# Patient Record
Sex: Male | Born: 1945 | Race: White | Hispanic: No | Marital: Married | Smoking: Never smoker
Health system: Southern US, Community
[De-identification: ages and names within clinical notes are randomized; demographics above are authoritative.]

## PROBLEM LIST (undated history)

## (undated) DIAGNOSIS — M722 Plantar fascial fibromatosis: Secondary | ICD-10-CM

## (undated) DIAGNOSIS — M1990 Unspecified osteoarthritis, unspecified site: Secondary | ICD-10-CM

## (undated) DIAGNOSIS — I10 Essential (primary) hypertension: Secondary | ICD-10-CM

## (undated) HISTORY — PX: VAGAL NERVE STIMULATOR REMOVAL: SUR1110

## (undated) HISTORY — PX: TRIGGER FINGER RELEASE: SHX641

## (undated) HISTORY — DX: Essential (primary) hypertension: I10

## (undated) HISTORY — DX: Unspecified osteoarthritis, unspecified site: M19.90

## (undated) HISTORY — PX: IMPLANTATION VAGAL NERVE STIMULATOR: SUR692

## (undated) HISTORY — DX: Plantar fascial fibromatosis: M72.2

---

## 2000-02-21 HISTORY — PX: TUMOR REMOVAL: SHX12

## 2001-02-20 HISTORY — PX: CARDIAC SURGERY: SHX584

## 2006-08-06 ENCOUNTER — Ambulatory Visit: Payer: Self-pay | Admitting: Oncology

## 2006-08-22 LAB — CBC WITH DIFFERENTIAL/PLATELET
Basophils Absolute: 0 10*3/uL (ref 0.0–0.1)
Eosinophils Absolute: 0.1 10*3/uL (ref 0.0–0.5)
HCT: 37.5 % — ABNORMAL LOW (ref 38.7–49.9)
HGB: 13.5 g/dL (ref 13.0–17.1)
LYMPH%: 19.6 % (ref 14.0–48.0)
MCV: 89.8 fL (ref 81.6–98.0)
MONO#: 0.5 10*3/uL (ref 0.1–0.9)
MONO%: 7.3 % (ref 0.0–13.0)
NEUT#: 4.5 10*3/uL (ref 1.5–6.5)
NEUT%: 71.4 % (ref 40.0–75.0)
Platelets: 339 10*3/uL (ref 145–400)
RBC: 4.18 10*6/uL — ABNORMAL LOW (ref 4.20–5.71)
WBC: 6.3 10*3/uL (ref 4.0–10.0)

## 2006-08-27 LAB — SPEP & IFE WITH QIG
Alpha-1-Globulin: 3.7 % (ref 2.9–4.9)
Alpha-2-Globulin: 10.6 % (ref 7.1–11.8)
Beta Globulin: 6.5 % (ref 4.7–7.2)
IgG (Immunoglobin G), Serum: 1080 mg/dL (ref 694–1618)
Total Protein, Serum Electrophoresis: 7.8 g/dL (ref 6.0–8.3)

## 2006-08-27 LAB — PSA: PSA: 2.67 ng/mL (ref 0.10–4.00)

## 2006-08-27 LAB — COMPREHENSIVE METABOLIC PANEL
Alkaline Phosphatase: 43 U/L (ref 39–117)
BUN: 25 mg/dL — ABNORMAL HIGH (ref 6–23)
CO2: 24 mEq/L (ref 19–32)
Glucose, Bld: 194 mg/dL — ABNORMAL HIGH (ref 70–99)
Sodium: 137 mEq/L (ref 135–145)
Total Bilirubin: 0.6 mg/dL (ref 0.3–1.2)
Total Protein: 7.8 g/dL (ref 6.0–8.3)

## 2006-08-27 LAB — KAPPA/LAMBDA LIGHT CHAINS
Kappa free light chain: 0.93 mg/dL (ref 0.33–1.94)
Kappa:Lambda Ratio: 0.69 (ref 0.26–1.65)

## 2006-08-27 LAB — CEA: CEA: 0.8 ng/mL (ref 0.0–5.0)

## 2006-08-28 ENCOUNTER — Ambulatory Visit (HOSPITAL_COMMUNITY): Admission: RE | Admit: 2006-08-28 | Discharge: 2006-08-28 | Payer: Self-pay | Admitting: Oncology

## 2006-09-20 ENCOUNTER — Ambulatory Visit (HOSPITAL_COMMUNITY): Admission: RE | Admit: 2006-09-20 | Discharge: 2006-09-20 | Payer: Self-pay | Admitting: Oncology

## 2006-09-20 ENCOUNTER — Encounter (INDEPENDENT_AMBULATORY_CARE_PROVIDER_SITE_OTHER): Payer: Self-pay | Admitting: Interventional Radiology

## 2006-09-24 ENCOUNTER — Ambulatory Visit: Payer: Self-pay | Admitting: Oncology

## 2006-10-24 ENCOUNTER — Encounter (INDEPENDENT_AMBULATORY_CARE_PROVIDER_SITE_OTHER): Payer: Self-pay | Admitting: Neurosurgery

## 2006-10-24 ENCOUNTER — Inpatient Hospital Stay (HOSPITAL_COMMUNITY): Admission: RE | Admit: 2006-10-24 | Discharge: 2006-10-25 | Payer: Self-pay | Admitting: Neurosurgery

## 2007-05-16 ENCOUNTER — Observation Stay (HOSPITAL_COMMUNITY): Admission: RE | Admit: 2007-05-16 | Discharge: 2007-05-17 | Payer: Self-pay | Admitting: Neurosurgery

## 2007-05-29 ENCOUNTER — Encounter: Admission: RE | Admit: 2007-05-29 | Discharge: 2007-05-29 | Payer: Self-pay | Admitting: Neurosurgery

## 2007-07-25 ENCOUNTER — Ambulatory Visit (HOSPITAL_COMMUNITY): Admission: RE | Admit: 2007-07-25 | Discharge: 2007-07-26 | Payer: Self-pay | Admitting: Neurosurgery

## 2008-01-14 ENCOUNTER — Ambulatory Visit (HOSPITAL_COMMUNITY): Admission: RE | Admit: 2008-01-14 | Discharge: 2008-01-15 | Payer: Self-pay | Admitting: Neurosurgery

## 2009-06-09 IMAGING — CR DG THORACOLUMBAR SPINE 2V
2 series · 2 of 2 positions shown · non-contrast
Comparison: Operative films from 05/16/2007

CLINICAL DATA: Right foot pain.  Status post spinal cord stimulator
placement.

THORACOLUMBAR SPINE - 2 VIEW

[t t-spine/l-spine junc. ap]
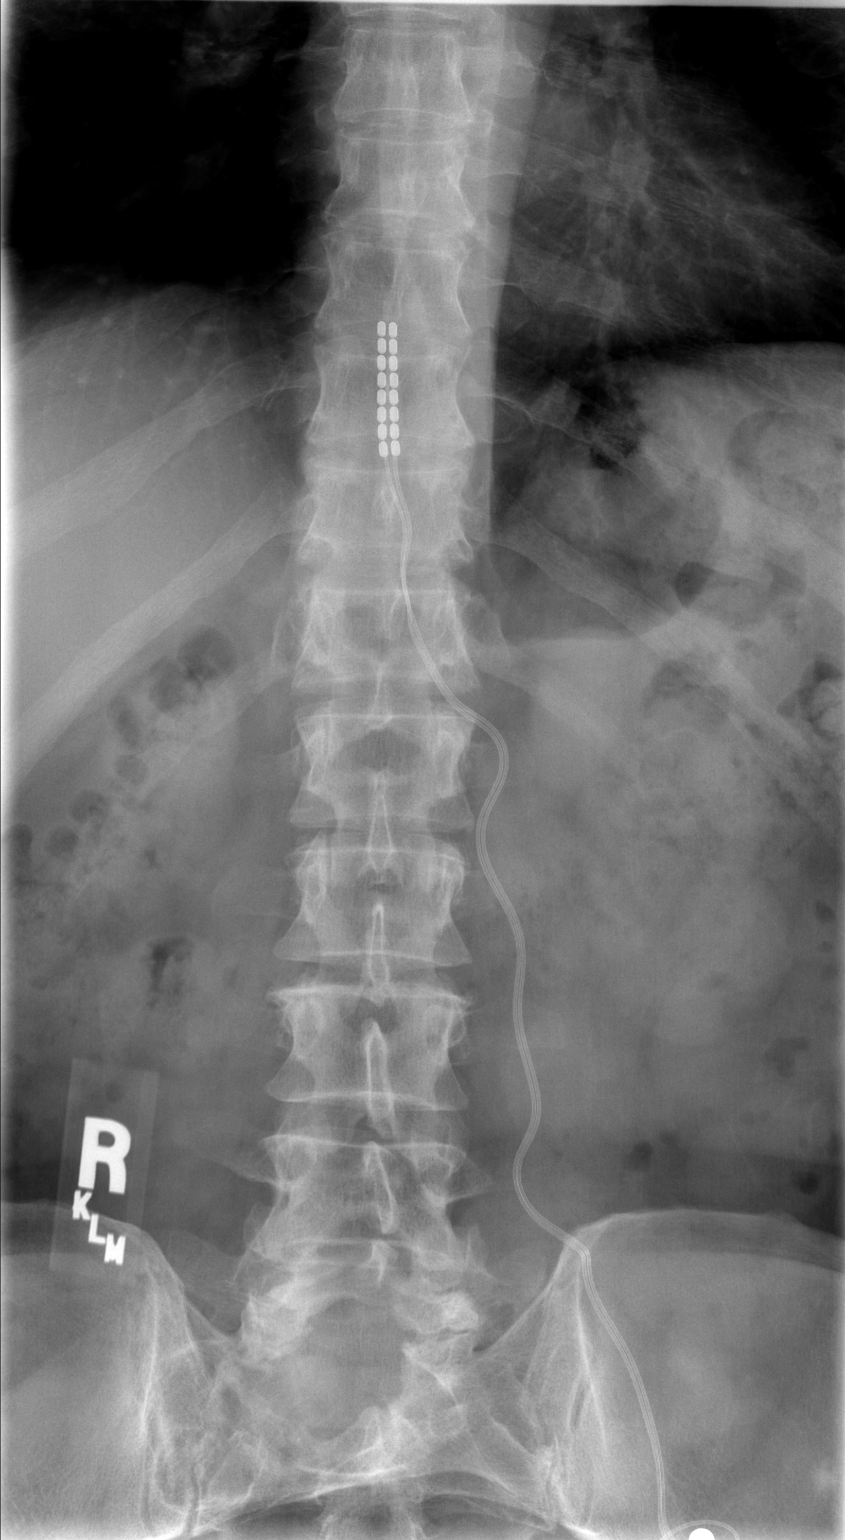

[t t-spine/l-spine junc lat]
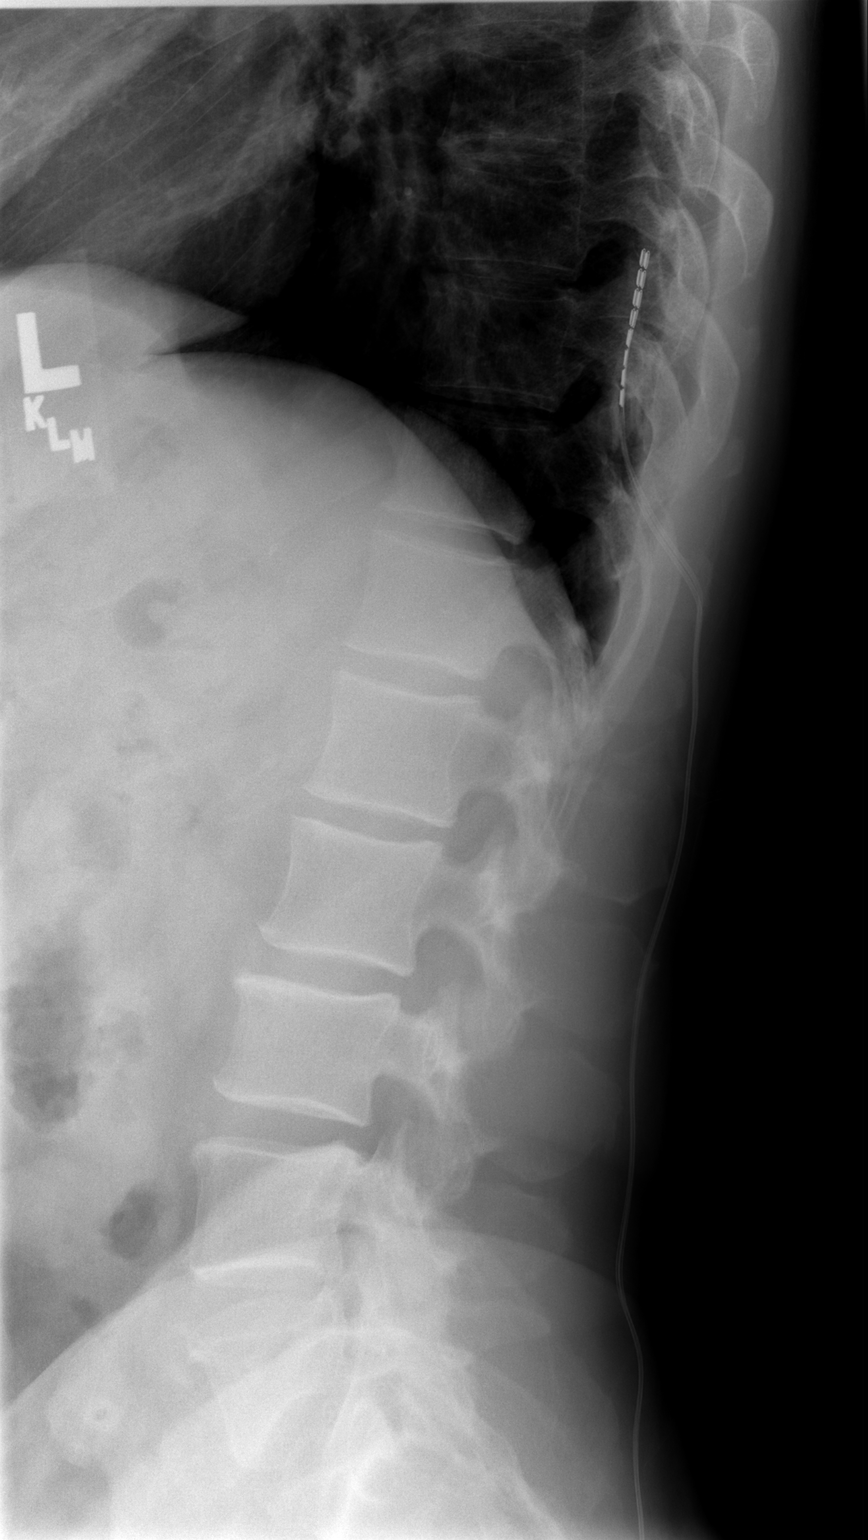

[2 of 2 positions shown; findings below may reference images not displayed]

FINDINGS: Two-view thoracolumbar spine shows a posterior spinal
stimulator in situ.  Comparing to the operative spot fluoro film,
the tips of the leads do not appear changed in position. The distal
tip of the lead is positioned at the level of the T10 vertebral
body.

Bony alignment is anatomic.  There is some loss of disc height at
L4-5.
IMPRESSION: No acute bony abnormality.

Status post spinal stimulator placement.  There is no appreciable
change in position of the leads when comparing to the operative
spot fluoro film from 05/16/2007.

## 2009-12-14 ENCOUNTER — Encounter
Admission: RE | Admit: 2009-12-14 | Discharge: 2010-02-23 | Payer: Self-pay | Source: Home / Self Care | Attending: Physical Medicine & Rehabilitation | Admitting: Physical Medicine & Rehabilitation

## 2009-12-20 ENCOUNTER — Ambulatory Visit: Payer: Self-pay | Admitting: Physical Medicine & Rehabilitation

## 2009-12-27 ENCOUNTER — Ambulatory Visit (HOSPITAL_COMMUNITY)
Admission: RE | Admit: 2009-12-27 | Discharge: 2009-12-27 | Payer: Self-pay | Admitting: Physical Medicine & Rehabilitation

## 2010-01-17 ENCOUNTER — Ambulatory Visit: Payer: Self-pay | Admitting: Physical Medicine & Rehabilitation

## 2010-02-17 ENCOUNTER — Encounter: Payer: Self-pay | Admitting: Sports Medicine

## 2010-02-23 ENCOUNTER — Encounter
Admission: RE | Admit: 2010-02-23 | Discharge: 2010-03-22 | Payer: Self-pay | Source: Home / Self Care | Attending: Physical Medicine & Rehabilitation | Admitting: Physical Medicine & Rehabilitation

## 2010-02-25 ENCOUNTER — Ambulatory Visit
Admission: RE | Admit: 2010-02-25 | Discharge: 2010-02-25 | Payer: Self-pay | Source: Home / Self Care | Attending: Physical Medicine & Rehabilitation | Admitting: Physical Medicine & Rehabilitation

## 2010-03-03 ENCOUNTER — Ambulatory Visit (HOSPITAL_COMMUNITY)
Admission: RE | Admit: 2010-03-03 | Discharge: 2010-03-03 | Payer: Self-pay | Source: Home / Self Care | Attending: Physical Medicine & Rehabilitation | Admitting: Physical Medicine & Rehabilitation

## 2010-03-14 ENCOUNTER — Encounter: Payer: Self-pay | Admitting: Sports Medicine

## 2010-03-14 ENCOUNTER — Ambulatory Visit
Admission: RE | Admit: 2010-03-14 | Discharge: 2010-03-14 | Payer: Self-pay | Source: Home / Self Care | Attending: Sports Medicine | Admitting: Sports Medicine

## 2010-03-14 DIAGNOSIS — M7750 Other enthesopathy of unspecified foot: Secondary | ICD-10-CM | POA: Insufficient documentation

## 2010-03-14 DIAGNOSIS — E1149 Type 2 diabetes mellitus with other diabetic neurological complication: Secondary | ICD-10-CM | POA: Insufficient documentation

## 2010-03-24 NOTE — Letter (Signed)
Summary: Generic Letter  [name]  [address]   [place], [place] [address]   Phone: [phone]  Fax: [phone]    03/14/2010 Claudette Laws, MD [name]   Dear Troy [name]:  I had the pleasure of seeing your patient : Troy [name] [address] [place], [place]  [address]  I did not find much structural breakdown in his foot but his symptoms were more referable to his left metatarsal arch and his right longitudinal arch.  I did not find plantar fascia irritation.  We made him some sports insoles with MT correction on left.  If these help we will continue that pattern.  If not we would consider a special diabetic orthotic.  He is already on multiple medicines but some of his symptoms could still be referrable to neuropathy.  Thaks for letting us see him.    Sincerely,  Vincent Gros MD

## 2010-03-24 NOTE — Consult Note (Signed)
Summary: [facility]  [facility]   Imported By: Marily Memos 02/28/2010 13:47:49  _____________________________________________________________________  External Attachment:    Type:   Image     Comment:   External Document

## 2010-03-24 NOTE — Assessment & Plan Note (Signed)
Summary: NP,PF BILATERAL,MC   Vital Signs:  Patient profile:   65 year old male Height:      67 inches Weight:      168 pounds BMI:     26.41 Pulse rate:   94 / minute BP sitting:   151 / 71  (right arm)  Vitals Entered By: Rochele Pages RN (March 14, 2010 10:18 AM) CC: lt foot pain - 1st and 2nd metatarsal pain plantar aspect   CC:  lt foot pain - 1st and 2nd metatarsal pain plantar aspect.  History of Present Illness: Left foot pain: Pain over the 1st and 2nd MTP/metatarsal heads for 6 months. His pain starts after he has been on his feet at work for 2 hours. He denies any radiating pain to his left foot.  No numbness or tingling. 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   Preventive Screening-Counseling & Management  Alcohol-Tobacco     Smoking Status: never  Current Problems (verified): 1)  Metatarsalgia  (ICD-726.70)  Allergies (verified): 1)  ! Heparin  Past History:  Past Medical History: DM HTN CAD HLD Post schwanoma right scarum  Past Surgical History: Post op schwanoma right sacrum 6 years ago  Social History: Smoking Status:  never  Review of Systems  The patient denies anorexia, fever, and weight loss.    Physical Exam  General:  Well NAD Msk:  Left foot: No breakdown of the longitudinal or transverse arch. No callus noted under the metatarsal heads.  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  Not very tender over the metatarsal heads or MTP joints.   we did not find pain on examination of PF on either side  Gait: normall appearing. No arch breakdown.    Impression & Recommendations:  Problem # 1:  METATARSALGIA (ICD-726.70) Assessment New  Left foot based on description of pain and history. However he does not have transverse arch breakdown nor a callous.  Plan: Use temporary sports insoles with a small metatarsal pad on the left foot. Pt walked without pain.  Will follow up  in 1 month. If improved will continue. If not better will make diabetic orthotics. Pt will call if not better 1 week prior to visit so that we can order the supplies.  Warned about skin breakdown or pain. See pt instructions.   Orders: Sports Insoles [phone])  Problem # 2:  DIABETIC PERIPHERAL NEUROPATHY (ICD-250.60)  Currently under [place] for this per Dr Wynn Banker I think some of hsis sxs relate to this did not sugest any medcation change but Dr Wynn Banker may want to adjust should we not find results with mechanical changes  His updated medication list for this problem includes:    Lisinopril-hydrochlorothiazide 20-12.5 Mg Tabs (Lisinopril-hydrochlorothiazide) .Marland Kitchen... Take 2 by mouth once daily    Glipizide 10 Mg Tabs (Glipizide) .Marland Kitchen... Take 2 by mouth daily    Januvia 100 Mg Tabs (Sitagliptin phosphate) .Marland Kitchen... Take 1 by mouth once daily    Metformin Hcl 1000 Mg Tabs (Metformin hcl) .Marland Kitchen... Take 2 by mouth daily    Aspirin 81 Mg Tbec (Aspirin) .Marland Kitchen... Take 1 by mouth once daily  Orders: Sports Insoles [phone])  Complete Medication List: 1)  Lisinopril-hydrochlorothiazide 20-12.5 Mg Tabs (Lisinopril-hydrochlorothiazide) .... Take 2 by mouth once daily 2)  Carvedilol 6.25 Mg Tabs (Carvedilol) .... Take 2 by mouth once daily 3)  Amlodipine Besylate 10 Mg Tabs (Amlodipine besylate) .... Take 1 by mouth once  daily 4)  Cymbalta 30 Mg Cpep (Duloxetine hcl) .... Take 1 by mouth once daily 5)  Glipizide 10 Mg Tabs (Glipizide) .... Take 2 by mouth daily 6)  Januvia 100 Mg Tabs (Sitagliptin phosphate) .... Take 1 by mouth once daily 7)  Metformin Hcl 1000 Mg Tabs (Metformin hcl) .... Take 2 by mouth daily 8)  Niaspan 1000 Mg Cr-tabs (Niacin (antihyperlipidemic)) .... Take 1 by mouth at bedtime 9)  Pravastatin Sodium 40 Mg Tabs (Pravastatin sodium) .... Take 1 by mouth at bedtime 10)  Tamsulosin Hcl 0.4 Mg Caps (Tamsulosin hcl) .... Take 2 by mouth once daily 11)  Finasteride 5 Mg Tabs (Finasteride) ....  Take 1 by mouth at bedtime 12)  Gabapentin 400 Mg Caps (Gabapentin) .... Take 2 caps four times daily 13)  Nortriptyline Hcl 10 Mg Caps (Nortriptyline hcl) .... Take 1 by mouth at bedtime 14)  Zolpidem Tartrate 12.5 Mg Cr-tabs (Zolpidem tartrate) .... Take 1 by mouth at bedtime 15)  Oxycodone Hcl 15 Mg Tabs (Oxycodone hcl) .... Take 1 by mouth every 6 hours 16)  Aspirin 81 Mg Tbec (Aspirin) .... Take 1 by mouth once daily 17)  Multiple Vitamin Tabs (Multiple vitamin) .... Take 1 by mouth once daily 18)  Stool Softener 100 Mg Caps (Docusate sodium) .... Take 1 by mouth two times a day 19)  Vitamin C 500 Mg Tabs (Ascorbic acid) .... Take 1 by mouth once daily 20)  Calcium-vitamin D 600-125 Mg-unit Tabs (Calcium-vitamin d) .... Take 1 by mouth once daily 21)  Vitamin D3 3000 Unit Tabs (Cholecalciferol) .... Take 1 by mouth once daily  Patient Instructions: 1)  Thanks for coming in today. 2)  That pad should sit behind the knuckles of the foot.  3)  If you get a blister or skin breakdown we need to know about it.  4)  We are trying to treat metataralgia.  If this is nerve pain this may not help.  5)  We can try to make a special diabetic insert if this does not work well.  6)  Come back in  1 month. If this is not working call in 3 weeks so we can order the special insert.    Orders Added: 1)  New Patient Level II [99202] 2)  Sports Insoles [L3510]

## 2010-03-29 ENCOUNTER — Ambulatory Visit: Payer: Self-pay | Admitting: Physical Medicine & Rehabilitation

## 2010-04-12 ENCOUNTER — Ambulatory Visit: Payer: Medicare Other | Admitting: Physical Medicine & Rehabilitation

## 2010-04-12 ENCOUNTER — Encounter: Payer: Medicare Other | Attending: Physical Medicine & Rehabilitation

## 2010-04-12 DIAGNOSIS — M79609 Pain in unspecified limb: Secondary | ICD-10-CM | POA: Insufficient documentation

## 2010-04-12 DIAGNOSIS — I10 Essential (primary) hypertension: Secondary | ICD-10-CM | POA: Insufficient documentation

## 2010-04-12 DIAGNOSIS — IMO0002 Reserved for concepts with insufficient information to code with codable children: Secondary | ICD-10-CM | POA: Insufficient documentation

## 2010-04-12 DIAGNOSIS — E119 Type 2 diabetes mellitus without complications: Secondary | ICD-10-CM | POA: Insufficient documentation

## 2010-04-12 DIAGNOSIS — M722 Plantar fascial fibromatosis: Secondary | ICD-10-CM | POA: Insufficient documentation

## 2010-04-14 ENCOUNTER — Encounter: Payer: Self-pay | Admitting: Sports Medicine

## 2010-04-14 ENCOUNTER — Ambulatory Visit (INDEPENDENT_AMBULATORY_CARE_PROVIDER_SITE_OTHER): Payer: Medicare Other | Admitting: Sports Medicine

## 2010-04-14 ENCOUNTER — Encounter: Payer: Self-pay | Admitting: Family Medicine

## 2010-04-14 DIAGNOSIS — E1149 Type 2 diabetes mellitus with other diabetic neurological complication: Secondary | ICD-10-CM

## 2010-04-14 DIAGNOSIS — M7750 Other enthesopathy of unspecified foot: Secondary | ICD-10-CM

## 2010-04-19 NOTE — Assessment & Plan Note (Signed)
Summary: F/U L FOOT PAIN,MC   Vital Signs:  Patient profile:   65 year old male Weight:      168 pounds BP sitting:   140 / 70  CC:  f/u left foot pain.  History of Present Illness: 65yo male to office for f/u of L foot pain.  Last visit given sports insoles with MT pad on left insoles which has been helpful.  Only time having pain now is when not in shoes with MT pad.  Continues to take oxycodone & nortriptyline for his back & diabetic neuropathy as prescribed by his PCP.  Allergies: 1)  ! Heparin  Past History:  Past Medical History: Last updated: 03/14/2010 DM HTN CAD HLD Post schwanoma right scarum  Past Surgical History: Last updated: 03/14/2010 Post op schwanoma right sacrum 6 years ago  Physical Exam  General:  Well-developed,well-nourished,in no acute distress; alert,appropriate and cooperative throughout examination Msk:  Left foot: No breakdown of the longitudinal or transverse arch. No callus noted under the metatarsal heads.  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  Mild TTP over MT heads & MTP joints. No tenderness over PF  Gait: normal appearing. No arch breakdown.  Pulses:  +2/4 DP & PT Neurologic:  sensation intact to light touch.     Impression & Recommendations:  Problem # 1:  METATARSALGIA (ICD-726.70) Assessment Improved  - Improved with sports insole & MT pad on left insole - given new set of sports insoles with MT in place on left insole.  Also given additional MT pads to place in dress shoes or other shoes where sports insole may not fit - Brochure given for [facility] to order insoles & MT directly in the future if needed - f/u with [place] as needed - f/u with pcp as scheduled  Orders: Sports Insoles (J8119)  Problem # 2:  DIABETIC PERIPHERAL NEUROPATHY (ICD-250.60) Assessment: Unchanged  - cont. current tx as managed by Dr. Wynn Banker - Some of his foot pain may be related to this, but has definitely shown great response with MT pads &  sports insoles  His updated medication list for this problem includes:    Lisinopril-hydrochlorothiazide 20-12.5 Mg Tabs (Lisinopril-hydrochlorothiazide) .Marland Kitchen... Take 2 by mouth once daily    Glipizide 10 Mg Tabs (Glipizide) .Marland Kitchen... Take 2 by mouth daily    Januvia 100 Mg Tabs (Sitagliptin phosphate) .Marland Kitchen... Take 1 by mouth once daily    Metformin Hcl 1000 Mg Tabs (Metformin hcl) .Marland Kitchen... Take 2 by mouth daily    Aspirin 81 Mg Tbec (Aspirin) .Marland Kitchen... Take 1 by mouth once daily  Orders: Sports Insoles [phone])  Complete Medication List: 1)  Lisinopril-hydrochlorothiazide 20-12.5 Mg Tabs (Lisinopril-hydrochlorothiazide) .... Take 2 by mouth once daily 2)  Carvedilol 6.25 Mg Tabs (Carvedilol) .... Take 2 by mouth once daily 3)  Amlodipine Besylate 10 Mg Tabs (Amlodipine besylate) .... Take 1 by mouth once daily 4)  Cymbalta 30 Mg Cpep (Duloxetine hcl) .... Take 1 by mouth once daily 5)  Glipizide 10 Mg Tabs (Glipizide) .... Take 2 by mouth daily 6)  Januvia 100 Mg Tabs (Sitagliptin phosphate) .... Take 1 by mouth once daily 7)  Metformin Hcl 1000 Mg Tabs (Metformin hcl) .... Take 2 by mouth daily 8)  Niaspan 1000 Mg Cr-tabs (Niacin (antihyperlipidemic)) .... Take 1 by mouth at bedtime 9)  Pravastatin Sodium 40 Mg Tabs (Pravastatin sodium) .... Take 1 by mouth at bedtime 10)  Tamsulosin Hcl 0.4 Mg Caps (Tamsulosin hcl) .... Take 2 by mouth once  daily 11)  Finasteride 5 Mg Tabs (Finasteride) .... Take 1 by mouth at bedtime 12)  Gabapentin 400 Mg Caps (Gabapentin) .... Take 2 caps four times daily 13)  Nortriptyline Hcl 10 Mg Caps (Nortriptyline hcl) .... Take 1 by mouth at bedtime 14)  Zolpidem Tartrate 12.5 Mg Cr-tabs (Zolpidem tartrate) .... Take 1 by mouth at bedtime 15)  Oxycodone Hcl 15 Mg Tabs (Oxycodone hcl) .... Take 1 by mouth every 6 hours 16)  Aspirin 81 Mg Tbec (Aspirin) .... Take 1 by mouth once daily 17)  Multiple Vitamin Tabs (Multiple vitamin) .... Take 1 by mouth once daily 18)  Stool  Softener 100 Mg Caps (Docusate sodium) .... Take 1 by mouth two times a day 19)  Vitamin C 500 Mg Tabs (Ascorbic acid) .... Take 1 by mouth once daily 20)  Calcium-vitamin D 600-125 Mg-unit Tabs (Calcium-vitamin d) .... Take 1 by mouth once daily 21)  Vitamin D3 3000 Unit Tabs (Cholecalciferol) .... Take 1 by mouth once daily   Orders Added: 1)  Est. Patient Level III [86578] 2)  Sports Insoles [L3510]

## 2010-04-19 NOTE — Letter (Signed)
Summary: Generic Letter  [facility]  [address]   [place], [place] [address]   Phone: [phone]  Fax: [phone]    04/14/2010 Claudette Laws, MD [facility]   Dear [name]:  I had the pleasure of seeing your patient : Troy [name] [address] [place], [place]  [address]  We saw [name] in the office today for follow-up of his left foot pain.  He has noted dramatic improvement with sports insoles and a metatarsal pad on his left insole.  We do not feel he needs any special diabetic orthotic at this time.  He was given an additional pair of sports insoles with a MT pad in place.  Some of his symptoms may still be related to his neuropathy, but he has shown dramatic improvement with the insoles.  He can follow up with [place] as needed.  Please feel free to contact us with any questions or concerns.  Thaks for letting us see him.    Sincerely,  Darene Lamer, DO [facility]  Roanna Epley, MD

## 2010-05-18 ENCOUNTER — Encounter: Payer: Medicare Other | Attending: Physical Medicine & Rehabilitation

## 2010-05-18 ENCOUNTER — Ambulatory Visit: Payer: BC Managed Care – PPO

## 2010-05-18 DIAGNOSIS — M79609 Pain in unspecified limb: Secondary | ICD-10-CM | POA: Insufficient documentation

## 2010-05-18 DIAGNOSIS — IMO0002 Reserved for concepts with insufficient information to code with codable children: Secondary | ICD-10-CM | POA: Insufficient documentation

## 2010-05-18 DIAGNOSIS — F329 Major depressive disorder, single episode, unspecified: Secondary | ICD-10-CM | POA: Insufficient documentation

## 2010-05-18 DIAGNOSIS — F3289 Other specified depressive episodes: Secondary | ICD-10-CM | POA: Insufficient documentation

## 2010-05-18 DIAGNOSIS — R339 Retention of urine, unspecified: Secondary | ICD-10-CM | POA: Insufficient documentation

## 2010-05-18 DIAGNOSIS — K5900 Constipation, unspecified: Secondary | ICD-10-CM | POA: Insufficient documentation

## 2010-05-18 DIAGNOSIS — M722 Plantar fascial fibromatosis: Secondary | ICD-10-CM | POA: Insufficient documentation

## 2010-06-13 ENCOUNTER — Encounter: Payer: Self-pay | Admitting: *Deleted

## 2010-06-16 ENCOUNTER — Encounter: Payer: Medicare Other | Attending: Neurosurgery | Admitting: Neurosurgery

## 2010-06-16 DIAGNOSIS — M25579 Pain in unspecified ankle and joints of unspecified foot: Secondary | ICD-10-CM

## 2010-06-16 DIAGNOSIS — R209 Unspecified disturbances of skin sensation: Secondary | ICD-10-CM | POA: Insufficient documentation

## 2010-06-16 DIAGNOSIS — M79609 Pain in unspecified limb: Secondary | ICD-10-CM | POA: Insufficient documentation

## 2010-06-16 NOTE — Assessment & Plan Note (Signed)
ACCOUNT:  [id number].  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 Aliene Beams and Dr. Julio Sicks.  He has had multiple spinal cord stimulators per Dr. Gerlene Fee since that time, had removed as they were not beneficial, and he is just left with residual nerve pain.  He tells that his pain is no different.  He rates it about 8 on average. His activity level is about 92.  His sleep is good.  Pain is worse in the daytime and late evening.  Walking, he can go about 25 minutes without any difficulty.  He can climb steps.  He drives and walks without assistance.  He is retired.  Neurologically, has some bladder control and bowel control issues from time-to-time, some depression, no suicidal thoughts, no signs of aberrant behaviors.  REVIEW OF SYSTEMS:  Notable for those difficulties described in history of present illness, past medical history, review of systems sheets, otherwise unremarkable except for sleep apnea and some fluctuation in blood sugar.  PHYSICAL EXAM:  Shows blood pressure be 140/55, pulse 70, respirations 18, O2 sats 97 on room air.  Motor strength is approximately 4+/5 in the lower extremities including iliopsoas, quadriceps dorsiflexors, EHL, and plantar flexors bilaterally.  He does appear somewhat confused with commands.  His sensation is intact in the lower extremity, somewhat diminished in the right foot.  He walks without any difficulty.  He is alert and orient x3.  His affect appears bright.  Constitutionally, he is within normal limits.  We did refill his oxycodone 15 mg one q.i.d., #120, with no refill.  His questions were encouraged and answered.  He and his wife understand the plan to follow up with RN in 1 month for med refills.     Alessandro Griep L. Blima Dessert    RLW/MedQ D:  06/16/2010 10:37:34  T:  06/16/2010 23:37:56  Job #:   [id number]

## 2010-07-05 NOTE — Op Note (Signed)
[name], [name] NO.:  [id number]   MEDICAL RECORD NO.:  [id number]          PATIENT TYPE:  OIB   LOCATION:  3524                         FACILITY:  [facility]   PHYSICIAN:  Reinaldo Meeker, M.D. DATE OF BIRTH:  August 24, 1945   DATE OF PROCEDURE:  01/14/2008  DATE OF DISCHARGE:                               OPERATIVE REPORT   PREOPERATIVE DIAGNOSIS:  Nonfunctioning spinal stimulator.   POSTOPERATIVE DIAGNOSIS:  Nonfunctioning spinal stimulator.   PROCEDURE:  Removal of spinal stimulator and spinal stimulator battery.   SURGEON:  Reinaldo Meeker, MD   PROCEDURE IN DETAIL:  After he was placed in the prone position, the  patient's thoracic, lumbar regions and the left buttock were prepped and  draped in the usual sterile fashion.  Buttock incision was reopened and  I carried down to the battery, which was dissected free of soft tissue.  It was removed without difficulty, a small wrench was used to disconnect  the wires from the battery.  The thoracic incision was then reopened and  carried down to the fascia.  Once we passed through the fascia, we could  see the lead going down towards the dura.  With gentle retraction, the  lead was removed off of the dura without difficulty and the entire  system pulled from a distal-to-proximal direction.  The wounds were then  both irrigated copiously with antibiotic irrigation and closed with  Vicryl on the subcutaneous and subcuticular tissues and Dermabond on the  skin.  A sterile dressing was then applied, and the patient was  extubated, taken to the recovery room in stable condition.           ______________________________  Reinaldo Meeker, M.D.     ROK/MEDQ  D:  01/14/2008  T:  01/14/2008  Job:  [id number]

## 2010-07-05 NOTE — Op Note (Signed)
NAME:  Troy [name], Troy [name] NO.:  [id number]   MEDICAL RECORD NO.:  [id number]          PATIENT TYPE:  OBV   LOCATION:  3599                         FACILITY:  [facility]   PHYSICIAN:  Reinaldo Meeker, M.D. DATE OF BIRTH:  22-Aug-1945   DATE OF PROCEDURE:  DATE OF DISCHARGE:                               OPERATIVE REPORT   PREOPERATIVE DIAGNOSIS:  Right S1 neuropathic pain.   POSTOPERATIVE DIAGNOSIS:  Right S1 neuropathic pain.   PROCEDURE:  Permanent spinal stimulator implant.   SURGEON:  Reinaldo Meeker, M.D.   SURGICAL INDICATIONS:  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  In the process of  removing it, the S1 nerve root was lost.  Since that time he has had  fairly severe neuropathic pain.  He has been tried on a variety of  conservative therapies without improvement.  He underwent a spinal  stimulator trial which gives him some nice relief of his pain and  requested a permanent plan and is now admitted for that procedure.   PROCEDURE IN DETAIL:  After being placed in the prone position, the  patient's back was prepped and draped in the usual sterile fashion.  A  localizing x-ray was taken prior to incision to identify the appropriate  level.  Incision was then made over the T11-T12 region and the incision  carried down to the spinous processes.  Subperiosteal dissection was  then carried out on left-sided spinous processes and laminae at T11 and  T12.  A self-retaining retractor was placed for exposure.  X-ray showed  the approach to be a good position.  A left hemilaminotomy was performed  at T11 and ligamentum flavum removed to expose the spinal dura.  A trial  passing of the lead showed it to pass into a perfect position extending  from the very bottom edge of T9 and covering T10 into the T10-11 disk  very well and slightly right of center.  A pocket was then made in the  left buttock.  Tissue was  undermined to make a nice pouch for the  battery and it was sized appropriately.  When the pouch was complete, a  single pass was used from the distal to proximal incisions and the  passed without difficulty.  Large amounts of irrigation were carried  out.  The lead was then placed back into the epidural space and once  again found to go into excellent position.  It was then attached the  battery and testing showed excellent impedances at all contacts.  The  battery was then placed into the pouch after large amounts of irrigation  were carried out over both incisions.  The battery pouch was then closed  with interrupted Vicryl in subcutaneous and subcuticular tissues and  Dermabond on the skin.  Irrigation was carried out on the thoracic  incision and that was closed with interrupted Vicryl on the muscle,  fascia, subcutaneous and subcuticular tissues and Dermabond was placed  on the skin there.  Final fluoroscopy showed that  the lead had not moved  during closure.  The patient was then extubated and taken to the  recovery room in stable condition.           ______________________________  Reinaldo Meeker, M.D.     ROK/MEDQ  D:  05/16/2007  T:  05/17/2007  Job:  [id number]

## 2010-07-05 NOTE — Op Note (Signed)
NAMELEGRAND, LASSER NO.:  [id number]   MEDICAL RECORD NO.:  [id number]          PATIENT TYPE:  INP   LOCATION:  3172                         FACILITY:  [facility]   PHYSICIAN:  Kathaleen Maser. Pool, M.D.    DATE OF BIRTH:  1946-01-26   DATE OF PROCEDURE:  10/24/2006  DATE OF DISCHARGE:                               OPERATIVE REPORT   SERVICE:  Neurosurgery.   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   PROCEDURE:  L5-S1, S2, and S3 laminectomy.  Resection of right S1 neural  fibroma.   SURGEON:  Kathaleen Maser. Pool, M.D.   ASSISTANT:  Tia Alert, M.D.   ANESTHESIA:  General endotracheal.   INDICATIONS FOR PROCEDURE:  Troy [name] is a 65 year old male with a  history of pelvic pain which has been progressively worsening.  Workup  demonstrates evidence of a large expansile mass involving the right  sacral canal appearing to emanate from either the S1 or S2 nerve root.  This causes the sacrum to be both destroyed and expanded.  A needle  biopsy was taken, which consistent with a spindle cell lesion with no  evidence of increased mitotic activity.  The patient presents now for  resection.   DESCRIPTION OF PROCEDURE:  The patient was taken to the operating room,  placed on the table in the supine position. After an adequate level of  anesthesia was achieved, the patient was placed prone onto a Wilson  frame and appropriately padded. The patient's lumbar region was prepped  and draped sterilely.  A 10 blade was used to make a linear incision  overlying the L5 through S3 segments.  This was carried down sharply in  the midline. Subperiosteal dissection was performed exposing the lamina  and facet joints of L5 bilaterally and the sacrum bilaterally.  Deep  self-retaining retractors were placed, intraoperative  localization  confirmed the sacrum.  Laminectomy was then performed using Leksell  rongeurs and Kerrison rongeurs to remove the inferior aspect of the  lamina of L5 and the sacral lamina down to approximately the S3 level.  The ligamentum flavum was elevated and resected in the usual fashion.  Underlying thecal sac was identified.  As was the right-sided nerve  sheath tumor.  The sacral canal was greatly expanded by this mass.  The  mass itself appeared to be confined to the capsule.  The proximal S1  nerve root led directly into this mass.  The S2 nerve root was  identified and dissected free confirming this was an S1 lesion. A linear  incision was then made along the root sleeve.  At this point it very  became quite clear that the tumor itself was a neurofibroma without any  separation from the underlying rootlets.  Because of its large size and  destructive characteristics coupled with numerous aspects of the tumor  which appeared to be aggressive invading the bone, it was felt that  partial resection was not appropriate or even feasible.  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  The tumor  was mobilized.  The S1 nerve root was divided proximally and distally  and the tumor was removed completely.  The proximal nerve root sleeve  was sutured with a 2-0 silk suture.  There is no CSF leak.  Gross  resection had been achieved. The wound was then  irrigated with antibiotic solution. Gelfoam and Tisseel were placed over  the nerve root sleeve closure.  A medium Hemovac drain was left in the  operative bed. The wound was then closed in layers.  There were no  apparent complications.  The patient tolerated the procedure well and he  returned to the recovery room postop.           ______________________________  Kathaleen Maser.  Pool, M.D.     HAP/MEDQ  D:  10/24/2006  T:  10/24/2006  Job:  [id number]

## 2010-07-05 NOTE — Op Note (Signed)
Troy [name], Troy [name] NO.:  [id number]   MEDICAL RECORD NO.:  [id number]          PATIENT TYPE:  OIB   LOCATION:  3535                         FACILITY:  [facility]   PHYSICIAN:  Reinaldo Meeker, M.D. DATE OF BIRTH:  1946/02/06   DATE OF PROCEDURE:  DATE OF DISCHARGE:                               OPERATIVE REPORT   PREOPERATIVE DIAGNOSIS:  Spinal stimulator nonfunction.   POSTOPERATIVE DIAGNOSIS:  Spinal stimulator nonfunction.   PROCEDURE:  Reposition of spinal stimulator lead.   SURGEON:  Dr. Reinaldo Meeker, M.D.   PROCEDURE IN DETAIL:  After placing in the prone position, the patient's  back and buttock were prepped and draped in usual sterile fashion.  Previous thoracic region incision was opened up and carried down the  spinous processes.  The subperiosteal dissection was easy as the tissue  would not totally re-adhere to the spinous processes.  The lead was  easily identified.  This was removed without difficulty.  The exposure  was carried one level inferior.  The lamina one level further was then  removed all the way up to a previous decompression and then the lead was  placed towards the right, symptomatic side, centered over the lower part  of T10 across T11 which was one full vertebral body lower that it had  been before.  We took great care to make sure that it also angled  towards the right side.  Once this was well positioned, the wound was  irrigated and the bleeding was controlled with Gelfoam.  The wound  was  closed with multiple layers of Vicryl in the muscle fascia, subcutaneous  subcuticular tissues, and Dermabond was placed on the skin.  A sterile  dressing was then applied and the patient was extubated, and taken to  recovery room in stable condition.           ______________________________  Reinaldo Meeker, M.D.     ROK/MEDQ  D:  07/25/2007  T:  07/26/2007  Job:  [id number]

## 2010-07-13 ENCOUNTER — Encounter: Payer: Medicare Other | Attending: Physical Medicine & Rehabilitation

## 2010-07-13 DIAGNOSIS — M25579 Pain in unspecified ankle and joints of unspecified foot: Secondary | ICD-10-CM

## 2010-07-13 DIAGNOSIS — R209 Unspecified disturbances of skin sensation: Secondary | ICD-10-CM | POA: Insufficient documentation

## 2010-07-13 DIAGNOSIS — M79609 Pain in unspecified limb: Secondary | ICD-10-CM

## 2010-07-13 DIAGNOSIS — G4730 Sleep apnea, unspecified: Secondary | ICD-10-CM | POA: Insufficient documentation

## 2010-07-13 DIAGNOSIS — IMO0002 Reserved for concepts with insufficient information to code with codable children: Secondary | ICD-10-CM

## 2010-08-10 ENCOUNTER — Encounter: Payer: Medicare Other | Attending: Physical Medicine & Rehabilitation

## 2010-08-10 DIAGNOSIS — M79609 Pain in unspecified limb: Secondary | ICD-10-CM | POA: Insufficient documentation

## 2010-08-10 DIAGNOSIS — IMO0002 Reserved for concepts with insufficient information to code with codable children: Secondary | ICD-10-CM

## 2010-08-10 DIAGNOSIS — R209 Unspecified disturbances of skin sensation: Secondary | ICD-10-CM | POA: Insufficient documentation

## 2010-08-10 DIAGNOSIS — M25579 Pain in unspecified ankle and joints of unspecified foot: Secondary | ICD-10-CM

## 2010-09-06 DIAGNOSIS — M25579 Pain in unspecified ankle and joints of unspecified foot: Secondary | ICD-10-CM

## 2010-09-07 ENCOUNTER — Encounter: Payer: Medicare Other | Attending: Neurosurgery | Admitting: Neurosurgery

## 2010-09-07 DIAGNOSIS — R209 Unspecified disturbances of skin sensation: Secondary | ICD-10-CM | POA: Insufficient documentation

## 2010-09-07 DIAGNOSIS — M79609 Pain in unspecified limb: Secondary | ICD-10-CM | POA: Insufficient documentation

## 2010-09-07 NOTE — Assessment & Plan Note (Signed)
This patient is seen by Dr. Riley Kill with a history of S1 schwannoma.  He had significant difficulties and destruction of the right aspect of his sacrum.  He is taking Cymbalta and other medications and not currently needing refills on.  He does return today for oxycodone.  He does have pain in his right foot.  He has some paresthesias there as well as some difficulty.  He has had multiple spine cord stimulators to Dr. Trudee Grip office that were not beneficial.  Today, he rates his pain at 6 or 8 to sharp burning pain, unchanged from previous.  General activity level is 6 or 7.  Pain is worse in the morning and evening.  Walking and sitting tend to aggravate, medication helps.  Walks without assistance.  He can walk about 45 minutes at a time.  He climb steps and drives.  REVIEW OF SYSTEMS:  Notable for those difficulties described above as well as some high and low blood sugar fluctuations, bladder control issues, otherwise within normal limits.  Past medical history, social history, and family history are unchanged.  PHYSICAL EXAMINATION:  VITAL SIGNS:  His blood pressure is 140/57, pulse 100, respirations 20, O2 sats 95 on room air. NEUROLOGIC:  His motor strength is 5/5 in lower extremity.  He does have some diminished sensation in the right foot.  Otherwise, he is intact neurologically.  Constitutionally, he is within normal limits.  He is alert and oriented x3.  He is somewhat hard of hearing.  His gait is normal.  ASSESSMENT: 1. History of schwannoma with chronic radicular discomfort and the     right foot paresthesias. 2. Plantar fasciitis.  PLAN:  Refill his oxycodone 15 mg 1 p.o. q.i.d. 120 with no refill.  His questions were encouraged and answered.  He will follow up in the clinic in 1 month.     Ardys Hataway L. Blima Dessert Electronically Signed    RLW/MedQ D:  09/07/2010 08:57:52  T:  09/07/2010 09:40:52  Job #:  [id number]

## 2010-10-05 ENCOUNTER — Encounter: Payer: Medicare Other | Attending: Neurosurgery | Admitting: Neurosurgery

## 2010-10-05 DIAGNOSIS — R209 Unspecified disturbances of skin sensation: Secondary | ICD-10-CM | POA: Insufficient documentation

## 2010-10-05 DIAGNOSIS — G8928 Other chronic postprocedural pain: Secondary | ICD-10-CM

## 2010-10-05 DIAGNOSIS — M25579 Pain in unspecified ankle and joints of unspecified foot: Secondary | ICD-10-CM

## 2010-10-05 DIAGNOSIS — M79609 Pain in unspecified limb: Secondary | ICD-10-CM | POA: Insufficient documentation

## 2010-10-05 DIAGNOSIS — M722 Plantar fascial fibromatosis: Secondary | ICD-10-CM

## 2010-10-05 NOTE — Assessment & Plan Note (Signed)
ACCOUNT:  [id number].  This is a patient of Dr. Wynn Banker, who was seen for chronic right foot pain.  He has S1 radiculopathy that is resultant of a schwannoma that was resected by Dr. Jordan Likes and he has had various spinal cord stimulator trials without any real help.  He is doing okay on his oxycodone, however, he ran out of his Cymbalta due to being not done all his insurance, and was about it for a while, the pain escalated, but he did get samples through the primary care and they state he will get Cymbalta through them from now on.  He rates his pain at 8 or 9.  It is a burning, aching type pain.  General activity level is 8 to 10.  Pain is worse in morning and evening.  Sleep patterns are fair.  Pain is worse with standing and activity.  Medication tends to help.  He walks without assistance.  He can walk about 40 minutes at a time. He climb steps and drives.  He is unemployed.  REVIEW OF SYSTEMS:  Notable for those difficulties as well as some depression.  No suicidal thoughts.  PAST MEDICAL HISTORY:  Unchanged.  SOCIAL HISTORY:  He is married.  Lives with his wife.  FAMILY HISTORY:  Unchanged.  PHYSICAL EXAMINATION:  VITAL SIGNS:  His blood pressure is 167/82, pulse 93, respirations 16, O2 sats 98 on room air. NEUROLOGIC:  His motor strength is 5/5 in the lower extremities with iliopsoas, quadriceps.  He cannot really dorsiflexors.  Plantar flex with his right foot due to the S1 nerve damage.  Constitutionally, he is within normal limits.  He is alert and oriented x3.  His gait is slightly altered, but not assisted.  The patient's sensation is somewhat diminished in that foot as well.  ASSESSMENT:  History of S1 schwannoma resection with resultant S1 nerve damage, right foot pain.  PLAN:  Refill oxycodone 15 mg one p.o. q.i.d., #120 with no refill.  His wife admitted that he has been taking more than he should and warned him verbally about low pill counts, and they stated  understanding, it is due to the fact he ran out of his Cymbalta, but they understand they cannot have the low pill count again.  They will follow up here in the clinic in 1 month.  Their questions were encouraged and answered.     Sharief Wainwright L. Blima Dessert Electronically Signed    RLW/MedQ D:  10/05/2010 09:47:39  T:  10/05/2010 14:15:36  Job #:  [id number]

## 2010-11-03 ENCOUNTER — Encounter: Payer: Medicare Other | Attending: Neurosurgery | Admitting: Neurosurgery

## 2010-11-03 DIAGNOSIS — S748X9A Injury of other nerves at hip and thigh level, unspecified leg, initial encounter: Secondary | ICD-10-CM | POA: Insufficient documentation

## 2010-11-03 DIAGNOSIS — M79609 Pain in unspecified limb: Secondary | ICD-10-CM | POA: Insufficient documentation

## 2010-11-03 DIAGNOSIS — Y838 Other surgical procedures as the cause of abnormal reaction of the patient, or of later complication, without mention of misadventure at the time of the procedure: Secondary | ICD-10-CM | POA: Insufficient documentation

## 2010-11-03 DIAGNOSIS — M25579 Pain in unspecified ankle and joints of unspecified foot: Secondary | ICD-10-CM

## 2010-11-03 DIAGNOSIS — G988 Other disorders of nervous system: Secondary | ICD-10-CM | POA: Insufficient documentation

## 2010-11-03 NOTE — Assessment & Plan Note (Signed)
This is a patient of Dr. Wynn Banker who is seen for chronic right foot pain.  He has a S1 radiculopathy resulting from a schwannoma that was resected by Dr. Jordan Likes.  The patient states his pain is unchanged.  It is an 8, a burning-type pain.  General activity level is an 8.  Sleep patterns are fair.  He sleeps with a CPAP per his wife.  Pain is worse with walking and sitting.  Medication tends to help.  He walks without assistance.  He can walk about 40 minutes at a time.  He drives and climb steps.  He is not employed.  REVIEW OF SYSTEMS:  Notable for difficulties described above as well as some high and low blood sugar fluctuation, bowel control issues, and depression.  No suicidal thoughts or aberrant behaviors.  PAST MEDICAL HISTORY:  Unchanged.  SOCIAL HISTORY:  He is married, lives with his wife.  FAMILY HISTORY:  Unchanged.  PHYSICAL EXAMINATION:  His blood pressure is 132/69, pulse 70, respirations 16, and O2 sats 97 on room air.  His motor strength and sensation appear to be intact.  He is somewhat diminished in the right lateral foot with his sensation.  Constitutionally, he is within normal limits.  He is alert and oriented x3.  We did talk about him sleeping during the day.  The patient's wife states that he wakes up tired even though he seems to sleep good.  I would suggest that they get his TSH level checked through his primary care doctor and she agrees and she states his blood work so far has been good.  He does nap during the day.  I have encouraged him to stop doing that and stay up more and be more active and we are going to decrease his oxycodone from 15 mg q.i.d. to 15 mg 3 times a day.  ASSESSMENT:  History of S1 schwannoma resection resulting in nerve damage in the right foot.  PLAN:  Refill oxycodone 15 mg 1 p.o. q.8 h., #90 with no refill.  Their questions were encouraged and answered.  They are in agreement with this plan.  We will see him back in a  month.     Ajla Mcgeachy L. Blima Dessert Electronically Signed    RLW/MedQ D:  11/03/2010 10:56:07  T:  11/03/2010 12:47:43  Job #:  [id number]

## 2010-11-14 LAB — BASIC METABOLIC PANEL
BUN: 13
Chloride: 100
Glucose, Bld: 168 — ABNORMAL HIGH
Potassium: 4.2

## 2010-11-14 LAB — CBC
HCT: 39.8
Hemoglobin: 13.7
MCV: 91.4
Platelets: 265
WBC: 6.8

## 2010-11-17 LAB — BASIC METABOLIC PANEL
BUN: 13
Chloride: 102
Creatinine, Ser: 1.01
Glucose, Bld: 121 — ABNORMAL HIGH

## 2010-11-17 LAB — CBC
MCHC: 34.5
MCV: 92.2
Platelets: 276
RDW: 12.3
WBC: 7.9

## 2010-11-22 LAB — GLUCOSE, CAPILLARY
Glucose-Capillary: 105 — ABNORMAL HIGH
Glucose-Capillary: 125 — ABNORMAL HIGH
Glucose-Capillary: 164 — ABNORMAL HIGH
Glucose-Capillary: 235 — ABNORMAL HIGH

## 2010-11-22 LAB — BASIC METABOLIC PANEL
Calcium: 9.6
Chloride: 100
Creatinine, Ser: 1.19
GFR calc Af Amer: >60
GFR calc non Af Amer: >60

## 2010-11-22 LAB — CBC
Platelets: 256
RBC: 3.71 — ABNORMAL LOW
WBC: 7.5

## 2010-11-28 ENCOUNTER — Ambulatory Visit: Payer: Medicare Other | Admitting: Physical Medicine & Rehabilitation

## 2010-11-28 ENCOUNTER — Encounter: Payer: Medicare Other | Attending: Neurosurgery

## 2010-11-28 DIAGNOSIS — M79609 Pain in unspecified limb: Secondary | ICD-10-CM | POA: Insufficient documentation

## 2010-11-28 DIAGNOSIS — M722 Plantar fascial fibromatosis: Secondary | ICD-10-CM

## 2010-11-28 DIAGNOSIS — R209 Unspecified disturbances of skin sensation: Secondary | ICD-10-CM | POA: Insufficient documentation

## 2010-11-28 DIAGNOSIS — IMO0002 Reserved for concepts with insufficient information to code with codable children: Secondary | ICD-10-CM

## 2010-11-28 DIAGNOSIS — M25579 Pain in unspecified ankle and joints of unspecified foot: Secondary | ICD-10-CM

## 2010-11-28 NOTE — Assessment & Plan Note (Signed)
REASON FOR VISIT:  Chronic right foot pain.  HISTORY:  A 65 year old male with history of schwannoma resected.  He has had chronic right S1 radicular discomfort.  He has had some good improvement with Cymbalta and oxycodone 15 mg t.i.d.  He is also taking Pamelor 10 at bedtime.  He has failed spinal cord stimulation.  He still has a lead that was not removed and therefore cannot get an MRI.  He is no longer working.  He was last employed May 16, 2010.  CURRENT PAIN MEDICATIONS: 1. Cymbalta 30 mg a day. 2. Pamelor 10 mg at bedtime. 3. Oxycodone 15 mg t.i.d.  He has had no signs of aberrant drug behavior.  His pain scores have been stable at around 8, but this allows him to do all his self-care mobility.  He has some depression on his review of systems, but otherwise negative.  His other meds have been reviewed and include lisinopril, amlodipine, carvedilol, steroid, Flomax, glipizide, Niaspan, pravastatin and gabapentin.  SOCIAL HISTORY:  Retired, lives with his wife.  PHYSICAL EXAMINATION: VITAL SIGNS:  Blood pressure 147/69, pulse 76, respirations 16 and O2 sat 96% on room air. GENERAL:  No acute distress.  Mood and affect appropriate. BACK:  His back has no tenderness to palpation. EXTREMITIES:  He has negative straight leg raising test.  He has absent deep tendon reflex right S1 normal on the left S1.  Sensation normal bilaterally in the feet.  He has no tenderness to palpation in the plantar surface of the foot.  IMPRESSION: 1. Chronic S1 radiculitis, radiculopathy and history of schwannoma. 2. History of plantar fasciitis.  Her symptoms controlled with foot     orthosis.  PLAN:  We will continue current pain medications.  I will see him back in 6 months and nurse practitioner visit in 1 month.     Erick Colace, M.D. Electronically Signed    AEK/MedQ D:  11/28/2010 10:12:06  T:  11/28/2010 11:15:20  Job #:  [id number]

## 2010-12-02 LAB — DIFFERENTIAL
Eosinophils Absolute: 0.2
Eosinophils Relative: 3
Lymphocytes Relative: 23
Lymphs Abs: 1.6
Monocytes Absolute: 0.7

## 2010-12-02 LAB — CBC
HCT: 39.1
Hemoglobin: 13.9
MCV: 89.5
Platelets: 302
RDW: 12.2

## 2010-12-02 LAB — CROSSMATCH: ABO/RH(D): AB POS

## 2010-12-02 LAB — POCT I-STAT GLUCOSE
Glucose, Bld: 150 — ABNORMAL HIGH
Operator id: 125961

## 2010-12-02 LAB — BASIC METABOLIC PANEL
BUN: 16
Chloride: 103
Glucose, Bld: 116 — ABNORMAL HIGH
Potassium: 4
Sodium: 139

## 2010-12-05 LAB — CBC
HCT: 42.9
Hemoglobin: 14.7
RBC: 4.72
WBC: 6.6

## 2010-12-05 LAB — PROTIME-INR: INR: 1

## 2010-12-29 ENCOUNTER — Encounter: Payer: Medicare Other | Attending: Neurosurgery | Admitting: Neurosurgery

## 2010-12-29 DIAGNOSIS — IMO0002 Reserved for concepts with insufficient information to code with codable children: Secondary | ICD-10-CM | POA: Insufficient documentation

## 2010-12-29 DIAGNOSIS — M545 Low back pain, unspecified: Secondary | ICD-10-CM

## 2010-12-29 DIAGNOSIS — M722 Plantar fascial fibromatosis: Secondary | ICD-10-CM | POA: Insufficient documentation

## 2010-12-29 DIAGNOSIS — M25579 Pain in unspecified ankle and joints of unspecified foot: Secondary | ICD-10-CM

## 2010-12-29 DIAGNOSIS — G894 Chronic pain syndrome: Secondary | ICD-10-CM

## 2010-12-29 NOTE — Assessment & Plan Note (Signed)
This is a patient of Dr. Wynn Banker seen with a history of schwannoma resection.  He has had some right chronic S1 radiculopathy.  He reports no change in his pain.  He rates it from 7-10.  It was a sharp aching type pain.  General activity level is 8 or 9.  Pain is worse during the day and evening.  Sleep patterns are fair.  Pain is worse with walking and sitting.  Medication tends to help.  His wife tells me he has had some medical issues lately, thought that his cardiac stent had collapsed which has not.  He has also had some renal issues with blood sugar fluctuations.  Mobility:  He can walk about 30 minutes at a time.  He does drive and climb steps functionally.  He does not work.  He is retired.  REVIEW OF SYSTEMS:  Notable for those difficulties described above as well as some urinary retention, depression.  No suicidal thoughts or aberrant behaviors.  Pill counts and UDS correct.  He has an UDS collected today.  PAST MEDICAL HISTORY:  Unchanged.  SOCIAL HISTORY:  Unchanged.  FAMILY HISTORY:  Unchanged.  PHYSICAL EXAMINATION:  VITAL SIGNS:  His blood pressure is 124/59, pulse 83, respirations 16, O2 sats 98 on room air. NEURO:  His motor strength, sensation are intact.  Reflexes are somewhat diminished in lower extremities.  Constitutionally is within normal limits.  He is alert and orient x3.  Has a slight limp.  IMPRESSION: 1. Chronic S1 radiculitis with radiculopathy. 2. History of schwannoma resection. 3. History of plantar fasciitis.  PLAN: 1. Continue his current medication regimen. 2. Refill oxycodone 15 mg one p.o. q.8 hours 90 with no refills.     Questions were encouraged and answered.  We will see him in a     month.     Porter Nakama L. Blima Dessert Electronically Signed    RLW/MedQ D:  12/29/2010 10:21:24  T:  12/29/2010 11:07:13  Job #:  [id number]

## 2011-01-26 ENCOUNTER — Encounter: Payer: Medicare Other | Admitting: Neurosurgery

## 2011-02-08 ENCOUNTER — Encounter: Payer: Medicare Other | Attending: Neurosurgery | Admitting: Neurosurgery

## 2011-02-08 DIAGNOSIS — IMO0002 Reserved for concepts with insufficient information to code with codable children: Secondary | ICD-10-CM | POA: Insufficient documentation

## 2011-02-08 DIAGNOSIS — G894 Chronic pain syndrome: Secondary | ICD-10-CM

## 2011-02-08 DIAGNOSIS — M722 Plantar fascial fibromatosis: Secondary | ICD-10-CM | POA: Insufficient documentation

## 2011-02-08 DIAGNOSIS — M5430 Sciatica, unspecified side: Secondary | ICD-10-CM

## 2011-02-08 DIAGNOSIS — M25579 Pain in unspecified ankle and joints of unspecified foot: Secondary | ICD-10-CM

## 2011-02-09 NOTE — Assessment & Plan Note (Signed)
This is a patient of Dr. Wynn Banker who was seen for history of S1 radiculitis with radiculopathy as a result of a schwannoma resection. He reports no change in his pain today as 7 or 8.  It is a dull, constant, pain.  General activity level is not indicated neither is when the pain is worse what worsens or helps.  Mobility, he walk about 25 minutes at a time.  He climb steps and drives.  Functionally, he is retired.  REVIEW OF SYSTEMS:  Notable for the difficulties described above as well as some high blood sugars and urinary retention, sleep apnea and depression.  No suicidal thoughts or aberrant behaviors.  Last pill count and UDS consistent.  PAST MEDICAL HISTORY, SOCIAL HISTORY AND FAMILY HISTORY:  Unchanged.  PHYSICAL EXAMINATION:  VITAL SIGNS:  His blood pressure is 144/71, pulse 122, respirations 18 and O2 sats 99 on room air.  EXTREMITIES:  His motor strength and sensation are intact, although somewhat diminished in the right lower extremity due to the radiculitis as well as some numbness.  She is having setting on the table the way is today. NEUROLOGIC:  Constitutionally, he is within normal limits.  He is alert and oriented x3.  He is somewhat hard of hearing.  Gait is stable, but he does have a limp.  IMPRESSION: 1. A right S1 radiculitis and radiculopathy, resultant of a schwannoma     resection. 2. History of plantar fasciitis, improved.  PLAN:  I refilled oxycodone 15 mg 1 p.o. q.8 hours 90 with no refills. Questions were encouraged and answered.  We will see him in a month.     Chablis Losh L. Blima Dessert Electronically Signed    RLW/MedQ D:  02/08/2011 11:13:01  T:  02/09/2011 05:06:38  Job #:  [id number]

## 2011-03-09 ENCOUNTER — Encounter: Payer: Medicare Other | Attending: Neurosurgery | Admitting: Neurosurgery

## 2011-03-09 DIAGNOSIS — M5430 Sciatica, unspecified side: Secondary | ICD-10-CM

## 2011-03-09 DIAGNOSIS — IMO0002 Reserved for concepts with insufficient information to code with codable children: Secondary | ICD-10-CM | POA: Insufficient documentation

## 2011-03-09 DIAGNOSIS — M25579 Pain in unspecified ankle and joints of unspecified foot: Secondary | ICD-10-CM

## 2011-03-09 DIAGNOSIS — M722 Plantar fascial fibromatosis: Secondary | ICD-10-CM | POA: Insufficient documentation

## 2011-03-09 NOTE — Assessment & Plan Note (Signed)
This is a patient of Dr. Wynn Banker seen for history of radiculitis, radiculopathy after a schwannoma resection.  He reports no change in his pain at 5 to an 8.  It is a burning, aching pain.  General activity level is not rated.  Pain is worse during the day.  Sleep patterns are fair.  Pain is worse with sitting.  Medication tends to help.  He walks without assistance.  He can walk about 25 minutes at a time. Functionally he is retired.  REVIEW OF SYSTEMS:  Notable for difficulties described above as well as some depression.  No suicidal thoughts or aberrant behaviors.  Pill count and UDS consistent.  No UDS today.  PAST MEDICAL HISTORY/SOCIAL HISTORY/FAMILY HISTORY:  Unchanged.  PHYSICAL EXAMINATION:  His blood pressure is 149/77, pulse 116, respirations 16, O2 sats 96 on room air.  Motor strength and sensation is intact.  Constitutionally he is within normal limits.  He is oriented x3.  Somewhat slow to respond, walks with a slight limp.  IMPRESSION: 1. History of right S1 radiculitis and radiculopathy as a result of     schwannoma resection 2. Plantar fasciitis.  PLAN:  Refill oxycodone 15 mg 1 p.o. q.8 hours, #90 with no refills. Questions were encouraged and answered.  He will be back in a month.     Troy [name] L. Blima Dessert Electronically Signed    RLW/MedQ D:  03/09/2011 09:12:10  T:  03/09/2011 10:24:22  Job #:  [id number]

## 2011-04-05 ENCOUNTER — Encounter: Payer: Medicare Other | Attending: Neurosurgery

## 2011-04-05 DIAGNOSIS — IMO0002 Reserved for concepts with insufficient information to code with codable children: Secondary | ICD-10-CM

## 2011-04-05 DIAGNOSIS — G894 Chronic pain syndrome: Secondary | ICD-10-CM

## 2011-04-05 DIAGNOSIS — R269 Unspecified abnormalities of gait and mobility: Secondary | ICD-10-CM | POA: Insufficient documentation

## 2011-04-05 DIAGNOSIS — M79609 Pain in unspecified limb: Secondary | ICD-10-CM

## 2011-04-05 DIAGNOSIS — M722 Plantar fascial fibromatosis: Secondary | ICD-10-CM | POA: Insufficient documentation

## 2011-05-02 ENCOUNTER — Ambulatory Visit (HOSPITAL_BASED_OUTPATIENT_CLINIC_OR_DEPARTMENT_OTHER): Payer: Medicare Other | Admitting: Physical Medicine & Rehabilitation

## 2011-05-02 ENCOUNTER — Encounter: Payer: Self-pay | Admitting: Physical Medicine & Rehabilitation

## 2011-05-02 ENCOUNTER — Encounter: Payer: Medicare Other | Attending: Neurosurgery

## 2011-05-02 DIAGNOSIS — M5416 Radiculopathy, lumbar region: Secondary | ICD-10-CM | POA: Insufficient documentation

## 2011-05-02 DIAGNOSIS — E1140 Type 2 diabetes mellitus with diabetic neuropathy, unspecified: Secondary | ICD-10-CM | POA: Insufficient documentation

## 2011-05-02 DIAGNOSIS — E1142 Type 2 diabetes mellitus with diabetic polyneuropathy: Secondary | ICD-10-CM

## 2011-05-02 DIAGNOSIS — E1149 Type 2 diabetes mellitus with other diabetic neurological complication: Secondary | ICD-10-CM

## 2011-05-02 DIAGNOSIS — M722 Plantar fascial fibromatosis: Secondary | ICD-10-CM | POA: Insufficient documentation

## 2011-05-02 DIAGNOSIS — IMO0002 Reserved for concepts with insufficient information to code with codable children: Secondary | ICD-10-CM | POA: Insufficient documentation

## 2011-05-02 MED ORDER — OXYCODONE HCL 15 MG PO TABS: 15.0000 mg | ORAL_TABLET | Freq: Three times a day (TID) | ORAL | Status: DC | PRN

## 2011-05-02 NOTE — Patient Instructions (Signed)
Diabetic Neuropathy Diabetic neuropathy is a common complication caused by diabetes. Neuropathy is a term that means nerve disease or damage. If your diabetes is uncontrolled and you have high blood glucose (sugar) levels, over time, this can lead to damage to nerves throughout your body. There are three types of diabetic neuropathy:   Peripheral.   Autonomic.   Focal.  PERIPHERAL NEUROPATHY Peripheral neuropathy is the most common form of diabetic neuropathy. It causes damage to the nerves of the feet and legs and eventually the hands and arms.  SYMPTOMS  Peripheral neuropathy occurs slowly over time. The peripheral nerves sense touch, hot and cold, and pain. When these nerves no longer work:   Your feet become numb.   You can no longer feel pressure or pain in your feet.   You may have burning, stabbing or aching pain.  This can lead to:  Thick calluses over pressure areas.   Pressure sores.   Ulcers. Ulcers can become infected with germs (bacteria) and can even lead to infection in the bones of the feet.  DIAGNOSIS  The diagnosis of diabetic neuropathy is difficult at best. Sensory function testing can be done with:  Light touch using a monofilament.   Vibration with tuning fork.   Sharp sensation with pin prick  Other tests that can help diagnose neuropathy are:  Nerve Conduction Velocities (NCV). This checks the transmission of electrical current through a nerve.   Electromyography (EMG). This shows how muscles respond to electrical signals transmitted by nearby nerves.   Quantitative sensory testing, which is used to assess how your nerves respond to vibration and changes in temperature.  AUTONOMIC NEUROPATHY The autonomic nervous system controls functions that you do not think about. Examples would be:   Heart beat.   Regulation of body temperature.   Blood pressure.   Urination.   Digestion.   Sweating.   Sexual function.  SYMPTOMS  The symptoms of  autonomic neuropathy vary depending on which nerves are affected.   There can be problems with digestion such as:   Feeling sick to your stomach (nausea).   Vomiting.   Bloating.   Constipation.   Diarrhea.   Abdominal pain.   Difficulty with urination may occur because of the inability to sense when your bladder is full. You may have urine leakage (incontinence) or inability to empty your bladder completely (retention).   Palpitations or a feeling of an abnormal heart beat.   Blood pressure drops on arising (orthostatic hypotension). This can happen when you first sit up or stand up. It causes you to feel:   Dizzy.   Weak.   Faint.   Sexual functioning:   In men, inability to attain and maintain an erection.   In women, vaginal dryness and problems with decreased sexual desire and arousal.  DIAGNOSIS  Diagnosis is often based on reported symptoms. Tell your medical caregiver if you experience:   Dizziness.   Constipation.   Diarrhea.   Inappropriate urination or inability to urinate.   Inability to get or maintain an erection.  Tests that may be done include:  An EKG or Holter Monitor. These are tests that can help show problems with the heart rate or heart rhythm.   X-rays can be used to find if there are problems with your ability to properly empty food from your stomach into the small intestine after eating.  FOCAL NEUROPATHY Focal neuropathy affects just one nerve tract and occurs suddenly. However, it usually improves by itself   over time. It does not cause long term damage, and treatments are usually needed only until the problem improves. SYMPTOMS  Examples include:   Abnormal eye movements or abnormal alignment of both eyes.   Weakness in the wrist.   Foot drop, which results in inability to lift the foot properly. This causes abnormal walking or foot movement.  DIAGNOSIS  Diagnosis is made based on your symptoms and what your caregiver finds on  your exam. Other tests that may be done include:  Nerve Conduction Velocities (NCV). This checks the transmission of electrical current through a nerve.   Electromyography (EMG). This shows how muscles respond to electrical signals transmitted by nearby nerves.   Quantitative sensory testing, which is used to assess how your nerves respond to vibration and changes in temperature.  TREATMENT Once nerve damage occurs it cannot be reversed. The goal of treatment is to keep the disease from getting worse. If it gets worse, it will affect more nerve fibers. Controlling your blood (sugar) is the key. You will need to keep your blood glucose and A1c at the target range prescribed by your caregiver. Things that will help control blood glucose levels include:  Blood glucose monitoring.   Meal planning.   Physical activity.   Diabetes medication.  Over time, maintaining lower blood glucose levels helps lessen symptoms. Sometimes, prescription pain medicine is needed. Focal neuropathy can be painful and unpredictable and occurs most often in older adults with diabetes.  SEEK MEDICAL CARE IF:   You develop peripheral nerve symptoms such as burning, numbness, or pain in your feet, legs or hands.   You develop autonomic nerve symptoms such as:   Dizziness.   Abnormal urinary control.   Inability to get an erection.   You develop focal nerve symptoms such as sudden abnormal eye movements or sudden foot drop.  Document Released: 04/17/2001 Document Revised: 01/26/2011 Document Reviewed: 07/17/2008 [employer] Patient Information 2012 [employer]. 

## 2011-05-02 NOTE — Progress Notes (Signed)
  Subjective:    Patient ID: Troy [name], male    DOB: 08/19/1945, 66 y.o.   MRN: [id number]  HPI REASON FOR VISIT: Chronic right foot pain.  HISTORY: A 66 year old male with history of schwannoma resected. He  has had chronic right S1 radicular discomfort. He has had some good  improvement with Cymbalta and oxycodone 15 mg t.i.d. He is also taking  Pamelor 10 at bedtime. He has failed spinal cord stimulation. He still  has a lead that was not removed and therefore cannot get an MRI.   Pain Inventory Average Pain 7 Pain Right Now 8 My pain is sharp and aching, intermittent  In the last 24 hours, has pain interfered with the following? General activity 6 Relation with others 0 Enjoyment of life 8 What TIME of day is your pain at its worst? morning and evening Sleep (in general) Fair  Pain is worse with: sitting Pain improves with: rest and medication Relief from Meds: 7  Mobility Walks w/out assistance how many minutes can you walk? 25 ability to climb steps?  yes do you drive?  yes Do you have any goals in this area?  no  Function retired  Neuro/Psych numbness depression  Prior Studies Any changes since last visit?  no  Physicians involved in your care Dr Fransico Him endocrinology       Review of Systems  Constitutional: Negative.        Improved dietary habits since starting on insulin  HENT: Positive for hearing loss.   Eyes: Negative.   Respiratory: Negative.   Cardiovascular: Negative.   Gastrointestinal: Negative.   Genitourinary: Positive for decreased urine volume (retention. Followed [facility]).  Musculoskeletal: Positive for arthralgias.  Skin: Negative.   Neurological: Negative.   Hematological: Negative.   Psychiatric/Behavioral: Negative.        Objective:   Physical Exam  Constitutional: He is oriented to person, place, and time. He appears well-developed.  Neck: Normal range of motion.  Musculoskeletal:       Right hip: He  exhibits normal range of motion.       Left hip: Normal.       Right knee: Normal.       Left knee: Normal.       Right ankle: He exhibits decreased range of motion.       Left ankle: He exhibits decreased range of motion.       Lumbar back: He exhibits normal range of motion, no tenderness, no swelling, no edema, no deformity and no spasm.  Neurological: He is alert and oriented to person, place, and time.  Psychiatric: He has a normal mood and affect.          Assessment & Plan:  1. 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  2. Diabetic peripheral neuropathy affecting both feet and affecting balance.  Continue current medications oxycodone 15 mg 3 times a day Continue gabapentin 400 mg 4 times per day

## 2011-05-29 ENCOUNTER — Encounter: Payer: Self-pay | Admitting: Physical Medicine & Rehabilitation

## 2011-06-01 ENCOUNTER — Encounter: Payer: Medicare Other | Attending: Physical Medicine & Rehabilitation | Admitting: *Deleted

## 2011-06-01 ENCOUNTER — Encounter: Payer: Self-pay | Admitting: *Deleted

## 2011-06-01 VITALS — BP 146/80 | HR 106 | Resp 18 | Ht 67.0 in | Wt 172.0 lb

## 2011-06-01 DIAGNOSIS — Z9889 Other specified postprocedural states: Secondary | ICD-10-CM | POA: Insufficient documentation

## 2011-06-01 DIAGNOSIS — E1149 Type 2 diabetes mellitus with other diabetic neurological complication: Secondary | ICD-10-CM

## 2011-06-01 DIAGNOSIS — M79609 Pain in unspecified limb: Secondary | ICD-10-CM | POA: Insufficient documentation

## 2011-06-01 DIAGNOSIS — IMO0002 Reserved for concepts with insufficient information to code with codable children: Secondary | ICD-10-CM | POA: Insufficient documentation

## 2011-06-01 DIAGNOSIS — M7750 Other enthesopathy of unspecified foot: Secondary | ICD-10-CM

## 2011-06-01 DIAGNOSIS — E1142 Type 2 diabetes mellitus with diabetic polyneuropathy: Secondary | ICD-10-CM | POA: Insufficient documentation

## 2011-06-01 MED ORDER — OXYCODONE HCL 15 MG PO TABS: 15.0000 mg | ORAL_TABLET | Freq: Three times a day (TID) | ORAL | Status: DC | PRN

## 2011-06-01 NOTE — Progress Notes (Signed)
Foot pain worse when he can't keep leg elevated. Endocrinologist visit tomorrow. Occasionally needs 4 pain pills per day; pill count WNL today.

## 2011-06-28 ENCOUNTER — Encounter: Payer: Self-pay | Admitting: *Deleted

## 2011-06-28 ENCOUNTER — Encounter: Payer: Medicare Other | Attending: Physical Medicine & Rehabilitation | Admitting: *Deleted

## 2011-06-28 VITALS — BP 148/55 | HR 90 | Resp 16 | Ht 67.0 in | Wt 182.0 lb

## 2011-06-28 DIAGNOSIS — E1142 Type 2 diabetes mellitus with diabetic polyneuropathy: Secondary | ICD-10-CM

## 2011-06-28 DIAGNOSIS — E1149 Type 2 diabetes mellitus with other diabetic neurological complication: Secondary | ICD-10-CM

## 2011-06-28 DIAGNOSIS — G8929 Other chronic pain: Secondary | ICD-10-CM | POA: Insufficient documentation

## 2011-06-28 DIAGNOSIS — D219 Benign neoplasm of connective and other soft tissue, unspecified: Secondary | ICD-10-CM | POA: Insufficient documentation

## 2011-06-28 DIAGNOSIS — IMO0002 Reserved for concepts with insufficient information to code with codable children: Secondary | ICD-10-CM

## 2011-06-28 DIAGNOSIS — M79609 Pain in unspecified limb: Secondary | ICD-10-CM | POA: Insufficient documentation

## 2011-06-28 MED ORDER — OXYCODONE HCL 15 MG PO TABS: 15.0000 mg | ORAL_TABLET | Freq: Three times a day (TID) | ORAL | Status: DC | PRN

## 2011-06-28 NOTE — Progress Notes (Signed)
No changes noted. Advised pt to keep medications in a safe place. 

## 2011-07-31 ENCOUNTER — Encounter: Payer: Self-pay | Admitting: Physical Medicine & Rehabilitation

## 2011-07-31 ENCOUNTER — Ambulatory Visit (HOSPITAL_BASED_OUTPATIENT_CLINIC_OR_DEPARTMENT_OTHER): Payer: Medicare Other | Admitting: Physical Medicine & Rehabilitation

## 2011-07-31 ENCOUNTER — Encounter: Payer: Medicare Other | Attending: Neurosurgery

## 2011-07-31 VITALS — BP 149/46 | HR 96 | Resp 16 | Ht 67.0 in | Wt 178.0 lb

## 2011-07-31 DIAGNOSIS — M5416 Radiculopathy, lumbar region: Secondary | ICD-10-CM

## 2011-07-31 DIAGNOSIS — M722 Plantar fascial fibromatosis: Secondary | ICD-10-CM | POA: Insufficient documentation

## 2011-07-31 DIAGNOSIS — E1149 Type 2 diabetes mellitus with other diabetic neurological complication: Secondary | ICD-10-CM

## 2011-07-31 DIAGNOSIS — IMO0002 Reserved for concepts with insufficient information to code with codable children: Secondary | ICD-10-CM

## 2011-07-31 DIAGNOSIS — E1142 Type 2 diabetes mellitus with diabetic polyneuropathy: Secondary | ICD-10-CM

## 2011-07-31 MED ORDER — OXYCODONE HCL 15 MG PO TABS: 15.0000 mg | ORAL_TABLET | Freq: Three times a day (TID) | ORAL | Status: DC | PRN

## 2011-07-31 NOTE — Patient Instructions (Signed)
Continue on your current medications. As we discussed we do not expect 100% pain relief. My concern if we increased the pain medicines is that he would be a higher risk for falls. You'll see my assistant Clydie Braun next month. She can discuss ankle stretching exercises as well as leg strengthening exercises

## 2011-07-31 NOTE — Progress Notes (Signed)
Subjective:    Patient ID: Troy [name], male    DOB: 01-11-1946, 66 y.o.   MRN: [id number]  HPI Wife is concerned whether current medications may be affecting the patient's kidney functions. I reviewed the pain medications. No nephrotoxic medications currently.Pain Inventory Average Pain 10 Pain Right Now 10 My pain is constant, burning, aching and in the past two weeks  In the last 24 hours, has pain interfered with the following? General activity 10 Relation with others 9 Enjoyment of life 10 What TIME of day is your pain at its worst? constant Sleep (in general) Fair  Pain is worse with: unsure Pain improves with: medication Relief from Meds: 7  Mobility walk without assistance how many minutes can you walk? 20-25 ability to climb steps?  yes do you drive?  yes Do you have any goals in this area?  yes  Function retired  Neuro/Psych numbness depression  Prior Studies Any changes since last visit?  no  Physicians involved in your care Any changes since last visit?  no   Family History  Problem Relation Age of Onset  . Hypertension Mother   . Diabetes Mother   . Stroke Mother   . Hypertension Father   . Stroke Father    History   Social History  . Marital Status: Married    Spouse Name: N/A    Number of Children: N/A  . Years of Education: N/A   Social History Main Topics  . Smoking status: Never Smoker   . Smokeless tobacco: Never Used  . Alcohol Use: No  . Drug Use: No  . Sexually Active: None   Other Topics Concern  . None   Social History Narrative  . None   Past Surgical History  Procedure Date  . Cardiac surgery   . Trigger finger release   . Implantation vagal nerve stimulator   . Vagal nerve stimulator removal    Past Medical History  Diagnosis Date  . High blood pressure   . Diabetes mellitus   . Arthritis   . Plantar fasciitis    BP 149/46  Pulse 96  Resp 16  Ht 5\' 7"  (1.702 m)  Wt 178 lb (80.74 kg)  BMI 27.88 kg/m2   SpO2 96%   Review of Systems  Respiratory: Positive for apnea.   Genitourinary: Positive for difficulty urinating.  Musculoskeletal:       Foot pain  All other systems reviewed and are negative.       Objective:   Physical Exam  Constitutional: He is oriented to person, place, and time. He appears well-developed and well-nourished.  Neurological: He is alert and oriented to person, place, and time. A sensory deficit is present. He exhibits abnormal muscle tone. Gait abnormal.  Reflex Scores:      Tricep reflexes are 3+ on the right side and 3+ on the left side.      Bicep reflexes are 3+ on the right side and 3+ on the left side.      Brachioradialis reflexes are 3+ on the right side and 3+ on the left side.      Patellar reflexes are 3+ on the right side and 3+ on the left side.      Achilles reflexes are 1+ on the right side and 2+ on the left side.      Reduced R S1 reflex and sensation Motor 3-/5 in B ankle DF  Psychiatric: He has a normal mood and affect.  Assessment & Plan:  1. 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  2. Diabetic peripheral neuropathy affecting both feet and affecting balance.  Continue current medications oxycodone 15 mg 3 times a day  Continue gabapentin 400 mg 4 times per day Pamelor qhs Return to clinic one month PA visit

## 2011-08-30 ENCOUNTER — Encounter: Payer: Self-pay | Admitting: Physical Medicine and Rehabilitation

## 2011-08-30 ENCOUNTER — Encounter: Payer: Medicare Other | Attending: Physical Medicine & Rehabilitation | Admitting: Physical Medicine and Rehabilitation

## 2011-08-30 VITALS — BP 147/85 | HR 84 | Resp 16 | Ht 65.0 in | Wt 179.0 lb

## 2011-08-30 DIAGNOSIS — E1142 Type 2 diabetes mellitus with diabetic polyneuropathy: Secondary | ICD-10-CM | POA: Insufficient documentation

## 2011-08-30 DIAGNOSIS — D168 Benign neoplasm of pelvic bones, sacrum and coccyx: Secondary | ICD-10-CM | POA: Insufficient documentation

## 2011-08-30 DIAGNOSIS — E1149 Type 2 diabetes mellitus with other diabetic neurological complication: Secondary | ICD-10-CM | POA: Insufficient documentation

## 2011-08-30 DIAGNOSIS — G629 Polyneuropathy, unspecified: Secondary | ICD-10-CM

## 2011-08-30 DIAGNOSIS — IMO0002 Reserved for concepts with insufficient information to code with codable children: Secondary | ICD-10-CM | POA: Insufficient documentation

## 2011-08-30 DIAGNOSIS — G8929 Other chronic pain: Secondary | ICD-10-CM | POA: Insufficient documentation

## 2011-08-30 DIAGNOSIS — M79609 Pain in unspecified limb: Secondary | ICD-10-CM | POA: Insufficient documentation

## 2011-08-30 DIAGNOSIS — R209 Unspecified disturbances of skin sensation: Secondary | ICD-10-CM | POA: Insufficient documentation

## 2011-08-30 DIAGNOSIS — M79673 Pain in unspecified foot: Secondary | ICD-10-CM

## 2011-08-30 DIAGNOSIS — G609 Hereditary and idiopathic neuropathy, unspecified: Secondary | ICD-10-CM

## 2011-08-30 MED ORDER — OXYCODONE HCL 15 MG PO TABS: 15.0000 mg | ORAL_TABLET | Freq: Three times a day (TID) | ORAL | Status: DC | PRN

## 2011-08-30 NOTE — Patient Instructions (Signed)
Try to do foot massage with a ball or ice bottle, continue with walking program. Try to walk in your pool.

## 2011-08-30 NOTE — Progress Notes (Signed)
Subjective:    Patient ID: Troy [name], male    DOB: [birth year], 66 y.o.   MRN: [id number]  HPI The patient complains about chronic pain in right foot . The patient also complains about numbness and tingling in the right foot. The problem has been stable, with having good and bad days.  Pain Inventory Average Pain 10 Pain Right Now 8 My pain is constant, burning, dull and aching  In the last 24 hours, has pain interfered with the following? General activity 8 Relation with others 8 Enjoyment of life 9 What TIME of day is your pain at its worst? all of the time Sleep (in general) Good  Pain is worse with: walking and sitting Pain improves with: medication Relief from Meds: 8  Mobility walk without assistance how many minutes can you walk? 20-25 ability to climb steps?  yes do you drive?  yes  Function disabled: date disabled 05/05/2009  Neuro/Psych bladder control problems depression  Prior Studies Any changes since last visit?  no  Physicians involved in your care Any changes since last visit?  no   Family History  Problem Relation Age of Onset  . Hypertension Mother   . Diabetes Mother   . Stroke Mother   . Hypertension Father   . Stroke Father    History   Social History  . Marital Status: Married    Spouse Name: N/A    Number of Children: N/A  . Years of Education: N/A   Social History Main Topics  . Smoking status: Never Smoker   . Smokeless tobacco: Never Used  . Alcohol Use: No  . Drug Use: No  . Sexually Active: None   Other Topics Concern  . None   Social History Narrative  . None   Past Surgical History  Procedure Date  . Cardiac surgery   . Trigger finger release   . Implantation vagal nerve stimulator   . Vagal nerve stimulator removal    Past Medical History  Diagnosis Date  . High blood pressure   . Diabetes mellitus   . Arthritis   . Plantar fasciitis    BP 147/85  Pulse 84  Resp 16  Ht 5\' 5"  (1.651 m)  Wt 179 lb  (81.194 kg)  BMI 29.79 kg/m2  SpO2 98%    Review of Systems  Constitutional:       High blood sugars  Respiratory: Positive for apnea.   Musculoskeletal:       Foot pain right  Psychiatric/Behavioral: Positive for dysphoric mood.  All other systems reviewed and are negative.       Objective:   Physical Exam Symmetric normal motor tone is noted throughout. Normal muscle bulk. Muscle testing reveals 5/5 muscle strength of the upper extremity, and 5/5 of the lower extremity. Full range of motion in upper and lower extremities. ROM of spine is not restricted. Fine motor movements are normal in both hands. Sensory is intact and symmetric to light touch, pinprick and proprioception. DTR in the upper and lower extremity are present and symmetric 3+, right achilles tendon reflex 1+, left 2+. No clonus is noted.  Patient arises from chair without difficulty. Wide based gait with normal arm swing bilateral , able to walk on heels and toes . Tandem walk is very unstable. No pronator drift. Rhomberg negative. Patient is very hard of hearing.       Assessment & Plan:  1. 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  2. Diabetic peripheral  neuropathy affecting both feet and affecting balance.  Continue current medications oxycodone 15 mg 3 times a day  Continue gabapentin 400 mg 4 times per day  Pamelor qhs  Advised the patient to do some foot massage with an ice bottle or  with a massage ball. Advised patient to walk in his pool, because walking on and increases his foot pain. Return to clinic one month PA visit

## 2011-09-25 ENCOUNTER — Encounter
Payer: Medicare Other | Attending: Physical Medicine and Rehabilitation | Admitting: Physical Medicine and Rehabilitation

## 2011-09-25 ENCOUNTER — Other Ambulatory Visit: Payer: Self-pay | Admitting: Physical Medicine and Rehabilitation

## 2011-09-25 ENCOUNTER — Encounter: Payer: Self-pay | Admitting: Physical Medicine and Rehabilitation

## 2011-09-25 VITALS — BP 137/57 | HR 85 | Resp 14 | Ht 67.0 in | Wt 173.0 lb

## 2011-09-25 DIAGNOSIS — G8929 Other chronic pain: Secondary | ICD-10-CM | POA: Insufficient documentation

## 2011-09-25 DIAGNOSIS — R209 Unspecified disturbances of skin sensation: Secondary | ICD-10-CM | POA: Insufficient documentation

## 2011-09-25 DIAGNOSIS — G629 Polyneuropathy, unspecified: Secondary | ICD-10-CM

## 2011-09-25 DIAGNOSIS — E1142 Type 2 diabetes mellitus with diabetic polyneuropathy: Secondary | ICD-10-CM | POA: Insufficient documentation

## 2011-09-25 DIAGNOSIS — M79609 Pain in unspecified limb: Secondary | ICD-10-CM | POA: Insufficient documentation

## 2011-09-25 DIAGNOSIS — G609 Hereditary and idiopathic neuropathy, unspecified: Secondary | ICD-10-CM

## 2011-09-25 DIAGNOSIS — IMO0002 Reserved for concepts with insufficient information to code with codable children: Secondary | ICD-10-CM | POA: Insufficient documentation

## 2011-09-25 DIAGNOSIS — M79671 Pain in right foot: Secondary | ICD-10-CM

## 2011-09-25 DIAGNOSIS — E1149 Type 2 diabetes mellitus with other diabetic neurological complication: Secondary | ICD-10-CM | POA: Insufficient documentation

## 2011-09-25 DIAGNOSIS — D219 Benign neoplasm of connective and other soft tissue, unspecified: Secondary | ICD-10-CM | POA: Insufficient documentation

## 2011-09-25 MED ORDER — OXYCODONE HCL 15 MG PO TABS: 15.0000 mg | ORAL_TABLET | Freq: Three times a day (TID) | ORAL | Status: DC | PRN

## 2011-09-25 NOTE — Patient Instructions (Signed)
Continue walking in the pool , try ice massage with a frozen water bottle for pain.

## 2011-09-25 NOTE — Progress Notes (Signed)
Subjective:    Patient ID: Troy [name], male    DOB: [birth year], 66 y.o.   MRN: [id number]  HPI The patient complains about chronic pain in right foot . The patient also complains about numbness and tingling in the right foot.  The problem has been stable, with having good and bad days.   Pain Inventory Average Pain 8 Pain Right Now 9 My pain is constant, sharp, dull and aching  In the last 24 hours, has pain interfered with the following? General activity 7 Relation with others 7 Enjoyment of life 9 What TIME of day is your pain at its worst? morning, evening and night Sleep (in general) Fair  Pain is worse with: walking and sitting Pain improves with: medication Relief from Meds: 8  Mobility walk without assistance how many minutes can you walk? 20-25 min ability to climb steps?  yes do you drive?  yes needs help with transfers Do you have any goals in this area?  no  Function retired  Neuro/Psych bladder control problems numbness depression  Prior Studies Any changes since last visit?  no  Physicians involved in your care Any changes since last visit?  no   Family History  Problem Relation Age of Onset  . Hypertension Mother   . Diabetes Mother   . Stroke Mother   . Hypertension Father   . Stroke Father    History   Social History  . Marital Status: Married    Spouse Name: N/A    Number of Children: N/A  . Years of Education: N/A   Social History Main Topics  . Smoking status: Never Smoker   . Smokeless tobacco: Never Used  . Alcohol Use: No  . Drug Use: No  . Sexually Active: None   Other Topics Concern  . None   Social History Narrative  . None   Past Surgical History  Procedure Date  . Cardiac surgery   . Trigger finger release   . Implantation vagal nerve stimulator   . Vagal nerve stimulator removal    Past Medical History  Diagnosis Date  . High blood pressure   . Diabetes mellitus   . Arthritis   . Plantar fasciitis      BP 137/57  Pulse 85  Resp 14  Ht 5\' 7"  (1.702 m)  Wt 173 lb (78.472 kg)  BMI 27.10 kg/m2  SpO2 95%     Review of Systems  Respiratory: Positive for apnea.   Musculoskeletal: Positive for myalgias, back pain, arthralgias and gait problem.  Neurological: Positive for numbness.  Psychiatric/Behavioral: Positive for dysphoric mood.  All other systems reviewed and are negative.       Objective:   Physical Exam Symmetric normal motor tone is noted throughout. Normal muscle bulk. Muscle testing reveals 5/5 muscle strength of the upper extremity, and 5/5 of the lower extremity. Full range of motion in upper and lower extremities. ROM of spine is not restricted. Fine motor movements are normal in both hands.  Sensory is intact and symmetric to light touch, pinprick and proprioception.  DTR in the upper and lower extremity are present and symmetric 3+, right achilles tendon reflex 1+, left 2+. No clonus is noted.  Patient arises from chair without difficulty. Wide based gait with normal arm swing bilateral , able to walk on heels and toes . Tandem walk is very unstable. No pronator drift. Rhomberg negative.  Patient is very hard of hearing.        Assessment &  Plan:  1. 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  2. Diabetic peripheral neuropathy affecting both feet and affecting balance.  Continue current medications oxycodone 15 mg 3 times a day  Continue gabapentin 400 mg 4 times per day  Pamelor qhs  Advised the patient to do some foot massage with an ice bottle or with a massage ball. Advised patient to continue to walk in his pool, because walking on and increases his foot pain.  Return to clinic one month PA visit

## 2011-10-25 ENCOUNTER — Encounter
Payer: Medicare Other | Attending: Physical Medicine and Rehabilitation | Admitting: Physical Medicine and Rehabilitation

## 2011-10-25 ENCOUNTER — Encounter: Payer: Self-pay | Admitting: Physical Medicine and Rehabilitation

## 2011-10-25 VITALS — BP 145/74 | HR 111 | Resp 16 | Ht 67.0 in | Wt 167.0 lb

## 2011-10-25 DIAGNOSIS — G589 Mononeuropathy, unspecified: Secondary | ICD-10-CM

## 2011-10-25 DIAGNOSIS — G8929 Other chronic pain: Secondary | ICD-10-CM | POA: Insufficient documentation

## 2011-10-25 DIAGNOSIS — G629 Polyneuropathy, unspecified: Secondary | ICD-10-CM

## 2011-10-25 DIAGNOSIS — M79673 Pain in unspecified foot: Secondary | ICD-10-CM

## 2011-10-25 DIAGNOSIS — E1142 Type 2 diabetes mellitus with diabetic polyneuropathy: Secondary | ICD-10-CM | POA: Insufficient documentation

## 2011-10-25 DIAGNOSIS — E1149 Type 2 diabetes mellitus with other diabetic neurological complication: Secondary | ICD-10-CM | POA: Insufficient documentation

## 2011-10-25 DIAGNOSIS — IMO0002 Reserved for concepts with insufficient information to code with codable children: Secondary | ICD-10-CM | POA: Insufficient documentation

## 2011-10-25 DIAGNOSIS — D219 Benign neoplasm of connective and other soft tissue, unspecified: Secondary | ICD-10-CM | POA: Insufficient documentation

## 2011-10-25 DIAGNOSIS — M79609 Pain in unspecified limb: Secondary | ICD-10-CM

## 2011-10-25 MED ORDER — OXYCODONE HCL 15 MG PO TABS: 15.0000 mg | ORAL_TABLET | Freq: Three times a day (TID) | ORAL | Status: DC | PRN

## 2011-10-25 NOTE — Progress Notes (Signed)
Subjective:    Patient ID: Troy [name], male    DOB: [birth year], 66 y.o.   MRN: [id number]  HPI The patient complains about chronic pain in right foot . The patient also complains about numbness and tingling in the right foot.  The problem has been stable, with having good and bad days. He reports that he has some increased pain in his feet in the morning, right after getting out of bed.  Pain Inventory Average Pain 9 Pain Right Now 9 My pain is constant, burning and aching  In the last 24 hours, has pain interfered with the following? General activity 8 Relation with others 7 Enjoyment of life 10 What TIME of day is your pain at its worst? All Day Sleep (in general) Fair  Pain is worse with: walking and some activites Pain improves with: medication Relief from Meds: 8  Mobility walk without assistance how many minutes can you walk? 20 ability to climb steps?  yes do you drive?  yes  Function retired  Neuro/Psych depression  Prior Studies Any changes since last visit?  no  Physicians involved in your care Any changes since last visit?  no   Family History  Problem Relation Age of Onset  . Hypertension Mother   . Diabetes Mother   . Stroke Mother   . Hypertension Father   . Stroke Father    History   Social History  . Marital Status: Married    Spouse Name: N/A    Number of Children: N/A  . Years of Education: N/A   Social History Main Topics  . Smoking status: Never Smoker   . Smokeless tobacco: Never Used  . Alcohol Use: No  . Drug Use: No  . Sexually Active: None   Other Topics Concern  . None   Social History Narrative  . None   Past Surgical History  Procedure Date  . Cardiac surgery   . Trigger finger release   . Implantation vagal nerve stimulator   . Vagal nerve stimulator removal    Past Medical History  Diagnosis Date  . High blood pressure   . Diabetes mellitus   . Arthritis   . Plantar fasciitis    BP 145/74  Pulse  111  Resp 16  Ht 5\' 7"  (1.702 m)  Wt 167 lb (75.751 kg)  BMI 26.16 kg/m2  SpO2 99%     Review of Systems  Constitutional: Negative.   HENT: Negative.   Eyes: Negative.   Respiratory: Positive for apnea.   Cardiovascular: Positive for leg swelling.  Gastrointestinal: Negative.   Genitourinary: Negative.   Musculoskeletal: Negative.   Skin: Negative.   Neurological: Negative.   Hematological: Negative.   Psychiatric/Behavioral:       Depression       Objective:   Physical Exam Symmetric normal motor tone is noted throughout. Normal muscle bulk. Muscle testing reveals 5/5 muscle strength of the upper extremity, and 5/5 of the lower extremity. Full range of motion in upper and lower extremities. ROM of spine is not restricted. Fine motor movements are normal in both hands.  Sensory is intact and symmetric to light touch, pinprick and proprioception.  DTR in the upper and lower extremity are present and symmetric 3+, right achilles tendon reflex 1+, left 2+. No clonus is noted.  Patient arises from chair without difficulty. Wide based gait with normal arm swing bilateral , able to walk on heels and toes . Tandem walk is very unstable. No pronator  drift. Rhomberg negative.  Patient is very hard of hearing.        Assessment & Plan:  1. 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  2. Diabetic peripheral neuropathy affecting both feet and affecting balance.  Continue current medications oxycodone 15 mg 3 times a day  Continue gabapentin 400 mg 4 times per day  Pamelor qhs  Advised the patient to do some foot massage with an ice bottle or with a massage ball. Advised patient to take a warm foot bath with epson salt in the morning, and move his feet in the water. Return to clinic one month PA visit

## 2011-10-25 NOTE — Patient Instructions (Signed)
Try a warm foot bath with epson salt in the morning, move your feet a little in the water.

## 2011-11-21 ENCOUNTER — Encounter
Payer: Medicare Other | Attending: Physical Medicine and Rehabilitation | Admitting: Physical Medicine and Rehabilitation

## 2011-11-21 ENCOUNTER — Encounter: Payer: Self-pay | Admitting: Physical Medicine and Rehabilitation

## 2011-11-21 VITALS — BP 115/57 | HR 102 | Resp 14 | Ht 67.0 in | Wt 166.0 lb

## 2011-11-21 DIAGNOSIS — G609 Hereditary and idiopathic neuropathy, unspecified: Secondary | ICD-10-CM

## 2011-11-21 DIAGNOSIS — R209 Unspecified disturbances of skin sensation: Secondary | ICD-10-CM | POA: Insufficient documentation

## 2011-11-21 DIAGNOSIS — M79673 Pain in unspecified foot: Secondary | ICD-10-CM

## 2011-11-21 DIAGNOSIS — E1142 Type 2 diabetes mellitus with diabetic polyneuropathy: Secondary | ICD-10-CM | POA: Insufficient documentation

## 2011-11-21 DIAGNOSIS — M79609 Pain in unspecified limb: Secondary | ICD-10-CM

## 2011-11-21 DIAGNOSIS — E1149 Type 2 diabetes mellitus with other diabetic neurological complication: Secondary | ICD-10-CM | POA: Insufficient documentation

## 2011-11-21 DIAGNOSIS — D219 Benign neoplasm of connective and other soft tissue, unspecified: Secondary | ICD-10-CM | POA: Insufficient documentation

## 2011-11-21 DIAGNOSIS — G8929 Other chronic pain: Secondary | ICD-10-CM | POA: Insufficient documentation

## 2011-11-21 DIAGNOSIS — IMO0002 Reserved for concepts with insufficient information to code with codable children: Secondary | ICD-10-CM | POA: Insufficient documentation

## 2011-11-21 DIAGNOSIS — G629 Polyneuropathy, unspecified: Secondary | ICD-10-CM

## 2011-11-21 MED ORDER — OXYCODONE HCL 15 MG PO TABS: 15.0000 mg | ORAL_TABLET | Freq: Three times a day (TID) | ORAL | Status: DC | PRN

## 2011-11-21 NOTE — Progress Notes (Signed)
Subjective:    Patient ID: Troy [name], male    DOB: 1945-02-25, 66 y.o.   MRN: [id number]  HPI The patient complains about chronic pain in right foot . The patient also complains about numbness and tingling in the right foot.  The problem has been stable, with having good and bad days.   Pain Inventory Average Pain 8 Pain Right Now 10 My pain is sharp and aching  In the last 24 hours, has pain interfered with the following? General activity 8 Relation with others 8 Enjoyment of life 8 What TIME of day is your pain at its worst? varies Sleep (in general) Fair  Pain is worse with: walking Pain improves with: medication Relief from Meds: 8  Mobility walk without assistance how many minutes can you walk? 15 ability to climb steps?  yes do you drive?  yes Do you have any goals in this area?  no  Function not employed: date last employed 05/18/09 retired Do you have any goals in this area?  no  Neuro/Psych depression  Prior Studies Any changes since last visit?  no  Physicians involved in your care Any changes since last visit?  no   Family History  Problem Relation Age of Onset  . Hypertension Mother   . Diabetes Mother   . Stroke Mother   . Hypertension Father   . Stroke Father    History   Social History  . Marital Status: Married    Spouse Name: N/A    Number of Children: N/A  . Years of Education: N/A   Social History Main Topics  . Smoking status: Never Smoker   . Smokeless tobacco: Never Used  . Alcohol Use: No  . Drug Use: No  . Sexually Active: None   Other Topics Concern  . None   Social History Narrative  . None   Past Surgical History  Procedure Date  . Cardiac surgery   . Trigger finger release   . Implantation vagal nerve stimulator   . Vagal nerve stimulator removal    Past Medical History  Diagnosis Date  . High blood pressure   . Diabetes mellitus   . Arthritis   . Plantar fasciitis    BP 115/57  Pulse 102  Resp  14  Ht 5\' 7"  (1.702 m)  Wt 166 lb (75.297 kg)  BMI 26.00 kg/m2  SpO2 94%     Review of Systems  Musculoskeletal: Positive for myalgias and arthralgias.  Psychiatric/Behavioral: Positive for dysphoric mood.  All other systems reviewed and are negative.       Objective:   Physical Exam  Constitutional: He is oriented to person, place, and time. He appears well-developed and well-nourished.  HENT:  Head: Normocephalic.       Very hard of hearing  Neck: Neck supple.  Musculoskeletal: He exhibits tenderness.  Neurological: He is alert and oriented to person, place, and time.  Skin: Skin is warm and dry.  Psychiatric: He has a normal mood and affect.    Symmetric normal motor tone is noted throughout. Normal muscle bulk. Muscle testing reveals 5/5 muscle strength of the upper extremity, and 5/5 of the lower extremity. Full range of motion in upper and lower extremities. ROM of spine is not restricted. Fine motor movements are normal in both hands.  Sensory is intact and symmetric to light touch, pinprick and proprioception.  DTR in the upper and lower extremity are present and symmetric 3+, right achilles tendon reflex 1+, left  2+. No clonus is noted.  Patient arises from chair without difficulty. Wide based gait with normal arm swing bilateral , able to walk on heels and toes . Tandem walk is very unstable. No pronator drift. Rhomberg negative.  Patient is very hard of hearing.        Assessment & Plan:  1. 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  2. Diabetic peripheral neuropathy affecting both feet and affecting balance.  Continue current medications oxycodone 15 mg 3 times a day  Continue gabapentin 400 mg 4 times per day  Pamelor qhs  Advised the patient to do some foot massage with an ice bottle or with a massage ball. Advised patient to take a warm foot bath with epson salt in the morning, and move his feet in the water.  Return to clinic one month PA  visit

## 2011-11-21 NOTE — Patient Instructions (Signed)
Continue with walking program, continue with massaging your feet with a ball or ice-bottle.

## 2011-12-19 ENCOUNTER — Encounter
Payer: Medicare Other | Attending: Physical Medicine and Rehabilitation | Admitting: Physical Medicine and Rehabilitation

## 2011-12-19 ENCOUNTER — Encounter: Payer: Self-pay | Admitting: Physical Medicine and Rehabilitation

## 2011-12-19 VITALS — BP 148/53 | HR 109 | Resp 14 | Ht 67.0 in | Wt 175.0 lb

## 2011-12-19 DIAGNOSIS — G8929 Other chronic pain: Secondary | ICD-10-CM | POA: Insufficient documentation

## 2011-12-19 DIAGNOSIS — M79609 Pain in unspecified limb: Secondary | ICD-10-CM | POA: Insufficient documentation

## 2011-12-19 DIAGNOSIS — IMO0002 Reserved for concepts with insufficient information to code with codable children: Secondary | ICD-10-CM | POA: Insufficient documentation

## 2011-12-19 DIAGNOSIS — R209 Unspecified disturbances of skin sensation: Secondary | ICD-10-CM | POA: Insufficient documentation

## 2011-12-19 DIAGNOSIS — E1142 Type 2 diabetes mellitus with diabetic polyneuropathy: Secondary | ICD-10-CM

## 2011-12-19 DIAGNOSIS — E1149 Type 2 diabetes mellitus with other diabetic neurological complication: Secondary | ICD-10-CM

## 2011-12-19 MED ORDER — OXYCODONE HCL 15 MG PO TABS: 15.0000 mg | ORAL_TABLET | Freq: Three times a day (TID) | ORAL | Status: DC | PRN

## 2011-12-19 NOTE — Progress Notes (Signed)
Subjective:    Patient ID: Troy [name], male    DOB: 1945-12-21, 66 y.o.   MRN: [id number]  HPI The patient complains about chronic pain in right foot . The patient also complains about numbness and tingling in the right foot.  The problem has been stable, with having good and bad days. Having more good days last month.   Pain Inventory Average Pain 7 Pain Right Now 8 My pain is dull and aching  In the last 24 hours, has pain interfered with the following? General activity 8 Relation with others 8 Enjoyment of life 8 What TIME of day is your pain at its worst? evening Sleep (in general) Fair  Pain is worse with: sitting Pain improves with: medication Relief from Meds: 8  Mobility walk without assistance how many minutes can you walk? 20-25 ability to climb steps?  yes do you drive?  yes Do you have any goals in this area?  yes  Function not employed: date last employed 10/11 retired  Neuro/Psych numbness depression  Prior Studies Any changes since last visit?  no  Physicians involved in your care Any changes since last visit?  no   Family History  Problem Relation Age of Onset  . Hypertension Mother   . Diabetes Mother   . Stroke Mother   . Hypertension Father   . Stroke Father    History   Social History  . Marital Status: Married    Spouse Name: N/A    Number of Children: N/A  . Years of Education: N/A   Social History Main Topics  . Smoking status: Never Smoker   . Smokeless tobacco: Never Used  . Alcohol Use: No  . Drug Use: No  . Sexually Active: None   Other Topics Concern  . None   Social History Narrative  . None   Past Surgical History  Procedure Date  . Cardiac surgery   . Trigger finger release   . Implantation vagal nerve stimulator   . Vagal nerve stimulator removal    Past Medical History  Diagnosis Date  . High blood pressure   . Diabetes mellitus   . Arthritis   . Plantar fasciitis    BP 148/53  Pulse 109   Resp 14  Ht 5\' 7"  (1.702 m)  Wt 175 lb (79.379 kg)  BMI 27.41 kg/m2  SpO2 96%     Review of Systems  Respiratory: Positive for apnea.   Musculoskeletal: Positive for myalgias and arthralgias.  Neurological: Positive for numbness.  Psychiatric/Behavioral: Positive for dysphoric mood.  All other systems reviewed and are negative.       Objective:   Physical Exam Constitutional: He is oriented to person, place, and time. He appears well-developed and well-nourished.  HENT:  Head: Normocephalic.  Very hard of hearing  Neck: Neck supple.  Musculoskeletal: He exhibits tenderness.  Neurological: He is alert and oriented to person, place, and time.  Skin: Skin is warm and dry.  Psychiatric: He has a normal mood and affect.   Symmetric normal motor tone is noted throughout. Normal muscle bulk. Muscle testing reveals 5/5 muscle strength of the upper extremity, and 5/5 of the lower extremity. Full range of motion in upper and lower extremities. ROM of spine is not restricted. Fine motor movements are normal in both hands.  Sensory is intact and symmetric to light touch, pinprick and proprioception.  DTR in the upper and lower extremity are present and symmetric 3+, right achilles tendon reflex 1+,  left 2+. No clonus is noted.  Patient arises from chair without difficulty. Wide based gait with normal arm swing bilateral , able to walk on heels and toes . Tandem walk is very unstable. No pronator drift. Rhomberg negative.  Patient is very hard of hearing.        Assessment & Plan:  1. 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  2. Diabetic peripheral neuropathy affecting both feet and affecting balance.  Continue current medications oxycodone 15 mg 3 times a day  Continue gabapentin 400 mg 4 times per day  Pamelor qhs  Advised the patient to do some foot massage with an ice bottle or with a massage ball. Advised patient to take a warm foot bath with epson salt in the morning,  and move his feet in the water.  Return to clinic one month PA visit

## 2011-12-19 NOTE — Patient Instructions (Signed)
Stay as active as tolerated, continue with massaging your feet with a massage ball or ice bottle.

## 2012-01-22 ENCOUNTER — Encounter
Payer: Medicare Other | Attending: Physical Medicine and Rehabilitation | Admitting: Physical Medicine and Rehabilitation

## 2012-01-22 ENCOUNTER — Encounter: Payer: Self-pay | Admitting: Physical Medicine and Rehabilitation

## 2012-01-22 VITALS — BP 171/55 | HR 91 | Resp 16 | Ht 67.0 in | Wt 179.0 lb

## 2012-01-22 DIAGNOSIS — E1142 Type 2 diabetes mellitus with diabetic polyneuropathy: Secondary | ICD-10-CM

## 2012-01-22 DIAGNOSIS — G8929 Other chronic pain: Secondary | ICD-10-CM | POA: Insufficient documentation

## 2012-01-22 DIAGNOSIS — E1149 Type 2 diabetes mellitus with other diabetic neurological complication: Secondary | ICD-10-CM | POA: Insufficient documentation

## 2012-01-22 DIAGNOSIS — D168 Benign neoplasm of pelvic bones, sacrum and coccyx: Secondary | ICD-10-CM | POA: Insufficient documentation

## 2012-01-22 DIAGNOSIS — IMO0002 Reserved for concepts with insufficient information to code with codable children: Secondary | ICD-10-CM | POA: Insufficient documentation

## 2012-01-22 DIAGNOSIS — M47816 Spondylosis without myelopathy or radiculopathy, lumbar region: Secondary | ICD-10-CM

## 2012-01-22 DIAGNOSIS — M47817 Spondylosis without myelopathy or radiculopathy, lumbosacral region: Secondary | ICD-10-CM

## 2012-01-22 MED ORDER — GABAPENTIN 400 MG PO CAPS: 400.0000 mg | ORAL_CAPSULE | Freq: Four times a day (QID) | ORAL | Status: DC

## 2012-01-22 MED ORDER — OXYCODONE HCL 15 MG PO TABS: 15.0000 mg | ORAL_TABLET | Freq: Three times a day (TID) | ORAL | Status: DC | PRN

## 2012-01-22 NOTE — Patient Instructions (Addendum)
Stay as active as tolerated, continue with exercising and massaging your feet. You can try Arnica cream for your muscle and joint pain.

## 2012-01-22 NOTE — Progress Notes (Signed)
Subjective:    Patient ID: Troy [name], male    DOB: [birth year], 66 y.o.   MRN: [id number]  HPI The patient complains about chronic pain in right foot . The patient also complains about numbness and tingling in the right foot.  The problem has been stable, with having good and bad days. Having more good days last month.   Pain Inventory Average Pain 8 Pain Right Now 10 My pain is dull and aching  In the last 24 hours, has pain interfered with the following? General activity 8 Relation with others 8 Enjoyment of life 9 What TIME of day is your pain at its worst? morning and evening Sleep (in general) Fair  Pain is worse with: walking and sitting Pain improves with: medication Relief from Meds: 8  Mobility walk without assistance how many minutes can you walk? 20 ability to climb steps?  yes do you drive?  yes Do you have any goals in this area?  yes  Function retired Do you have any goals in this area?  yes  Neuro/Psych numbness depression  Prior Studies Any changes since last visit?  no  Physicians involved in your care Any changes since last visit?  no   Family History  Problem Relation Age of Onset  . Hypertension Mother   . Diabetes Mother   . Stroke Mother   . Hypertension Father   . Stroke Father    History   Social History  . Marital Status: Married    Spouse Name: N/A    Number of Children: N/A  . Years of Education: N/A   Social History Main Topics  . Smoking status: Never Smoker   . Smokeless tobacco: Never Used  . Alcohol Use: No  . Drug Use: No  . Sexually Active: None   Other Topics Concern  . None   Social History Narrative  . None   Past Surgical History  Procedure Date  . Cardiac surgery   . Trigger finger release   . Implantation vagal nerve stimulator   . Vagal nerve stimulator removal    Past Medical History  Diagnosis Date  . High blood pressure   . Diabetes mellitus   . Arthritis   . Plantar fasciitis    BP  171/55  Pulse 91  Resp 16  Ht 5\' 7"  (1.702 m)  Wt 179 lb (81.194 kg)  BMI 28.04 kg/m2  SpO2 97%    Review of Systems  Respiratory: Positive for apnea.   Musculoskeletal: Positive for myalgias and arthralgias.  Neurological: Positive for numbness.  Psychiatric/Behavioral: Positive for dysphoric mood.  All other systems reviewed and are negative.       Objective:   Physical Exam Very hard of hearing  Neck: Neck supple.  Musculoskeletal: He exhibits tenderness.  Neurological: He is alert and oriented to person, place, and time.  Skin: Skin is warm and dry.  Psychiatric: He has a normal mood and affect.  Symmetric normal motor tone is noted throughout. Normal muscle bulk. Muscle testing reveals 5/5 muscle strength of the upper extremity, and 5/5 of the lower extremity. Full range of motion in upper and lower extremities. ROM of spine is not restricted. Fine motor movements are normal in both hands.  Sensory is intact and symmetric to light touch, pinprick and proprioception.  DTR in the upper and lower extremity are present and symmetric 3+, right achilles tendon reflex 1+, left 2+. No clonus is noted.  Patient arises from chair without difficulty. Wide  based gait with normal arm swing bilateral , able to walk on heels and toes . Tandem walk is very unstable. No pronator drift. Rhomberg negative.  Patient is very hard of hearing.        Assessment & Plan:  1. 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  2. Diabetic peripheral neuropathy affecting both feet and affecting balance.  Continue current medications oxycodone 15 mg 3 times a day  Continue gabapentin 800 mg 4 times per day, refilled for 3 month  Pamelor qhs  Advised the patient to do some foot massage with an ice bottle or with a massage ball. Advised patient to take a warm foot bath with epson salt in the morning, and move his feet in the water.  Return to clinic one month PA visit

## 2012-02-22 ENCOUNTER — Encounter
Payer: Medicare Other | Attending: Physical Medicine and Rehabilitation | Admitting: Physical Medicine and Rehabilitation

## 2012-02-22 ENCOUNTER — Encounter: Payer: Self-pay | Admitting: Physical Medicine and Rehabilitation

## 2012-02-22 VITALS — BP 120/54 | HR 94 | Resp 14 | Ht 67.0 in | Wt 181.0 lb

## 2012-02-22 DIAGNOSIS — G609 Hereditary and idiopathic neuropathy, unspecified: Secondary | ICD-10-CM

## 2012-02-22 DIAGNOSIS — G629 Polyneuropathy, unspecified: Secondary | ICD-10-CM

## 2012-02-22 DIAGNOSIS — IMO0002 Reserved for concepts with insufficient information to code with codable children: Secondary | ICD-10-CM | POA: Insufficient documentation

## 2012-02-22 DIAGNOSIS — D219 Benign neoplasm of connective and other soft tissue, unspecified: Secondary | ICD-10-CM | POA: Insufficient documentation

## 2012-02-22 DIAGNOSIS — M47816 Spondylosis without myelopathy or radiculopathy, lumbar region: Secondary | ICD-10-CM

## 2012-02-22 DIAGNOSIS — E1149 Type 2 diabetes mellitus with other diabetic neurological complication: Secondary | ICD-10-CM | POA: Insufficient documentation

## 2012-02-22 DIAGNOSIS — M47817 Spondylosis without myelopathy or radiculopathy, lumbosacral region: Secondary | ICD-10-CM

## 2012-02-22 DIAGNOSIS — E1142 Type 2 diabetes mellitus with diabetic polyneuropathy: Secondary | ICD-10-CM | POA: Insufficient documentation

## 2012-02-22 MED ORDER — OXYCODONE HCL 15 MG PO TABS: 15.0000 mg | ORAL_TABLET | Freq: Three times a day (TID) | ORAL | Status: DC | PRN

## 2012-02-22 NOTE — Patient Instructions (Signed)
Continue with staying as active as tolerated 

## 2012-02-22 NOTE — Progress Notes (Signed)
Subjective:    Patient ID: Troy [name], male    DOB: 04-29-1945, 67 y.o.   MRN: [id number]  HPI The patient complains about chronic pain in right foot . The patient also complains about numbness and tingling in the right foot.  The problem has been stable, with having good and bad days. Having more good days last month.   Pain Inventory Average Pain 8 Pain Right Now 7 My pain is constant, dull and aching  In the last 24 hours, has pain interfered with the following? General activity 8 Relation with others 8 Enjoyment of life 9 What TIME of day is your pain at its worst? morning and evening Sleep (in general) Fair  Pain is worse with: walking and sitting Pain improves with: pacing activities Relief from Meds: 7  Mobility ability to climb steps?  yes do you drive?  yes Do you have any goals in this area?  yes  Function not employed: date last employed 03/11 retired Do you have any goals in this area?  yes  Neuro/Psych depression  Prior Studies Any changes since last visit?  no  Physicians involved in your care Any changes since last visit?  no   Family History  Problem Relation Age of Onset  . Hypertension Mother   . Diabetes Mother   . Stroke Mother   . Hypertension Father   . Stroke Father    History   Social History  . Marital Status: Married    Spouse Name: N/A    Number of Children: N/A  . Years of Education: N/A   Social History Main Topics  . Smoking status: Never Smoker   . Smokeless tobacco: Never Used  . Alcohol Use: No  . Drug Use: No  . Sexually Active: None   Other Topics Concern  . None   Social History Narrative  . None   Past Surgical History  Procedure Date  . Cardiac surgery   . Trigger finger release   . Implantation vagal nerve stimulator   . Vagal nerve stimulator removal    Past Medical History  Diagnosis Date  . High blood pressure   . Diabetes mellitus   . Arthritis   . Plantar fasciitis    BP 120/54   Pulse 94  Resp 14  Ht 5\' 7"  (1.702 m)  Wt 181 lb (82.101 kg)  BMI 28.35 kg/m2  SpO2 98%     Review of Systems  Respiratory: Positive for apnea.   Psychiatric/Behavioral: Positive for dysphoric mood.  All other systems reviewed and are negative.       Objective:   Physical Exam Very hard of hearing  Neck: Neck supple.  Musculoskeletal: He exhibits tenderness.  Neurological: He is alert and oriented to person, place, and time.  Skin: Skin is warm and dry.  Psychiatric: He has a normal mood and affect.  Symmetric normal motor tone is noted throughout. Normal muscle bulk. Muscle testing reveals 5/5 muscle strength of the upper extremity, and 5/5 of the lower extremity. Full range of motion in upper and lower extremities. ROM of spine is not restricted. Fine motor movements are normal in both hands.  Sensory is intact and symmetric to light touch, pinprick and proprioception.  DTR in the upper and lower extremity are present and symmetric 3+, right achilles tendon reflex 1+, left 2+. No clonus is noted.  Patient arises from chair without difficulty. Wide based gait with normal arm swing bilateral , able to walk on heels and  toes . Tandem walk is very unstable. No pronator drift. Rhomberg negative.  Patient is very hard of hearing.        Assessment & Plan:  1. 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  2. Diabetic peripheral neuropathy affecting both feet and affecting balance.  Continue current medications oxycodone 15 mg 3 times a day  Continue gabapentin 800 mg 4 times per day, refilled for 3 month  Pamelor qhs  Advised the patient to do some foot massage with an ice bottle or with a massage ball. Advised patient to take a warm foot bath with epson salt in the morning, and move his feet in the water.  Return to clinic one month PA visit

## 2012-03-14 IMAGING — CT CT HEAD W/O CM
1 series · 16 of 30 positions shown, 20 images · non-contrast
Comparison: None.

CLINICAL DATA: Decreased memory, imbalance, slow reflexes,
diabetes, hypertension

CT HEAD WITHOUT CONTRAST
TECHNIQUE: Contiguous axial images were obtained from the base of
the skull through the vertex without contrast.

[Series 2: headseq 4.8 h37s · axial · 0.43mm/px · z∈[+108,+271]mm · 16 of 36 slices shown, 20 images]
[im 2/36  brain]
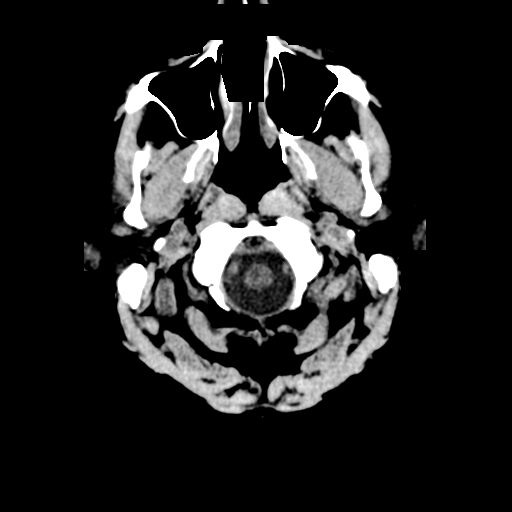
[im 2/36  bone]
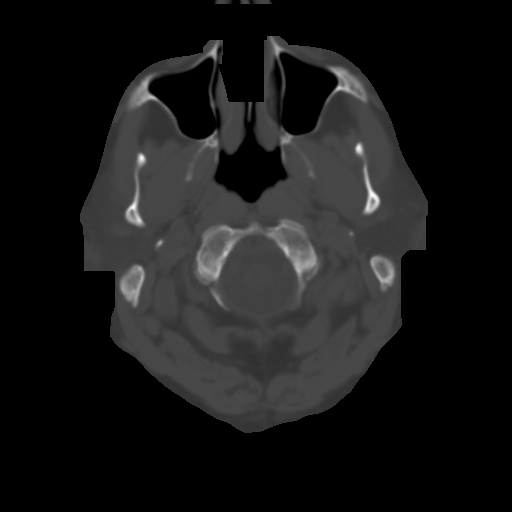
[im 4/36  brain]
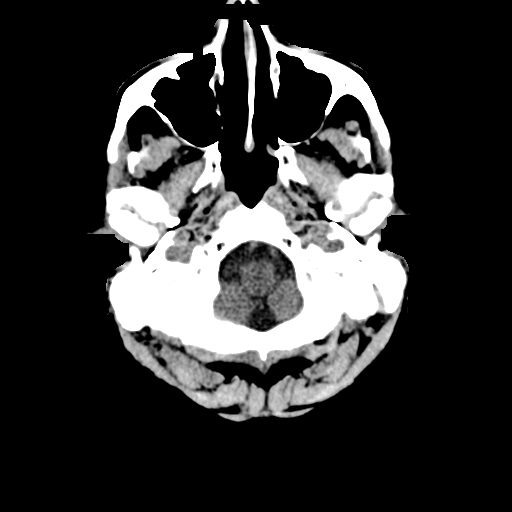
[im 7/36  brain]
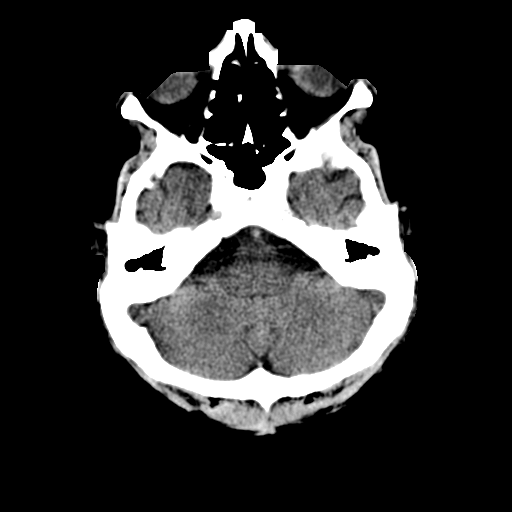
[im 9/36  brain]
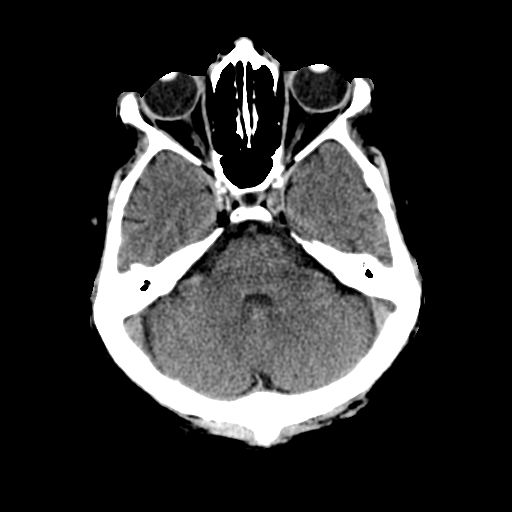
[im 10/36  brain]
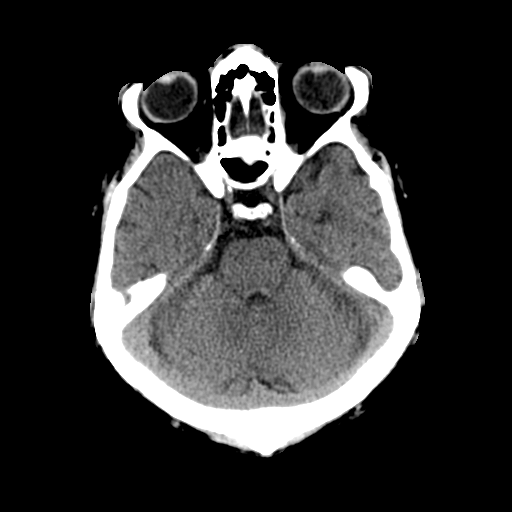
[im 10/36  bone]
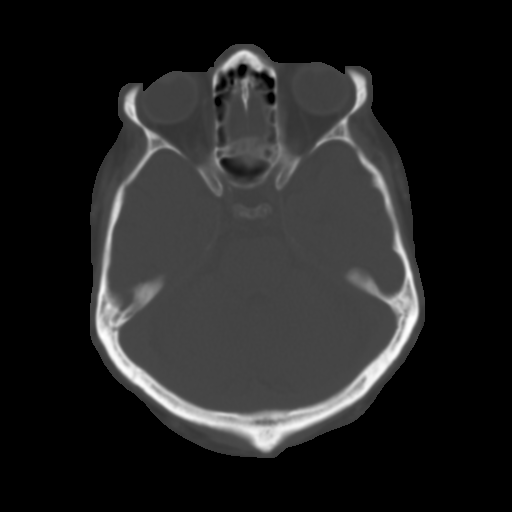
[im 13/36  brain]
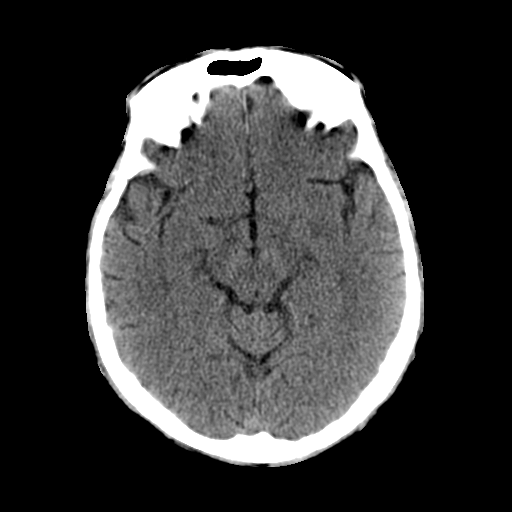
[im 15/36  brain]
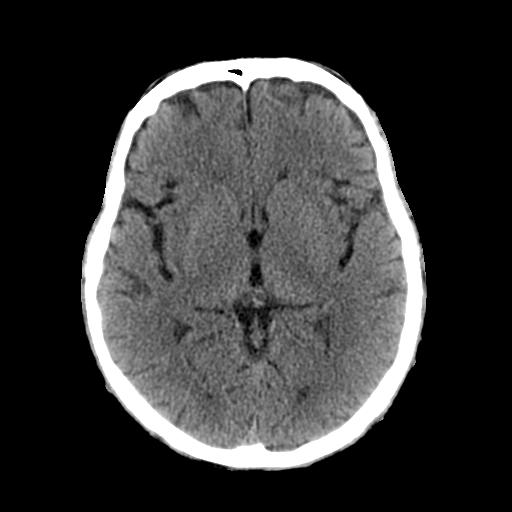
[im 17/36  brain]
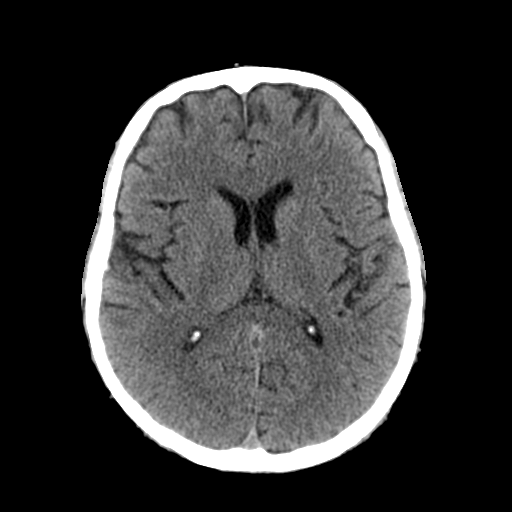
[im 19/36  brain]
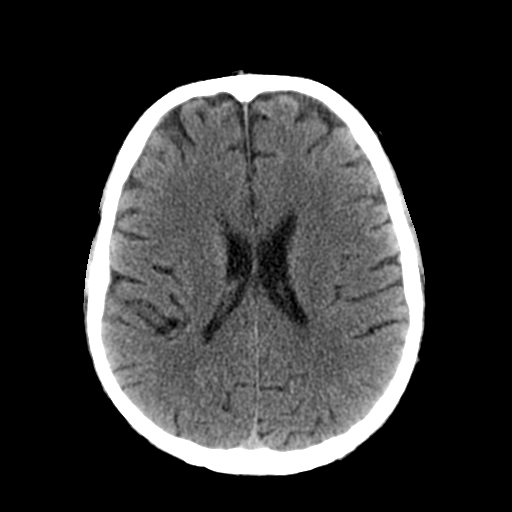
[im 19/36  bone]
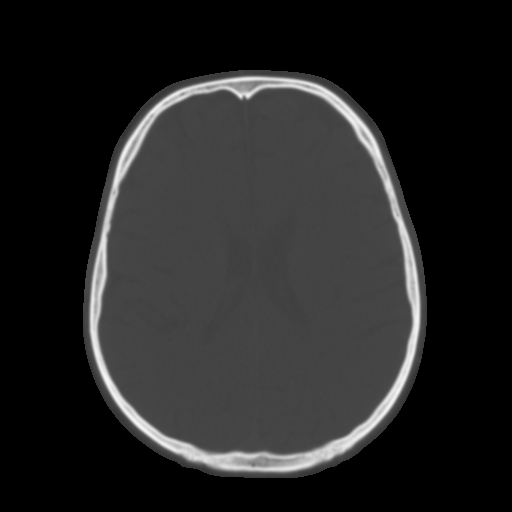
[im 21/36  brain]
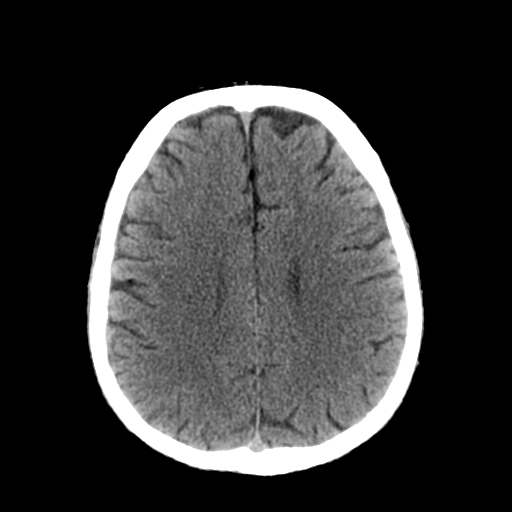
[im 23/36  brain]
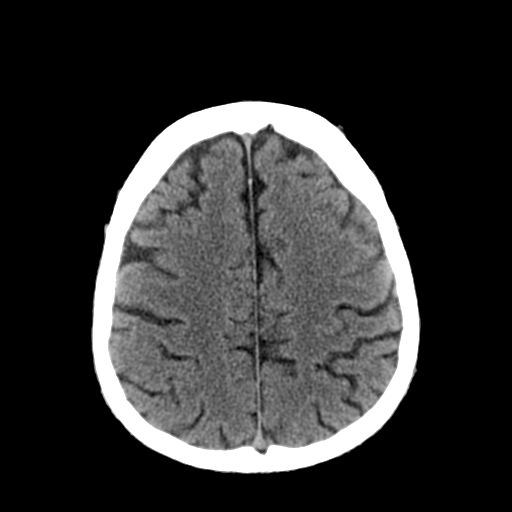
[im 26/36  brain]
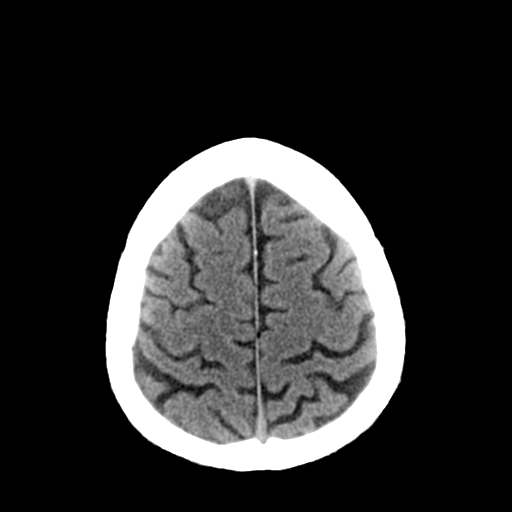
[im 27/36  brain]
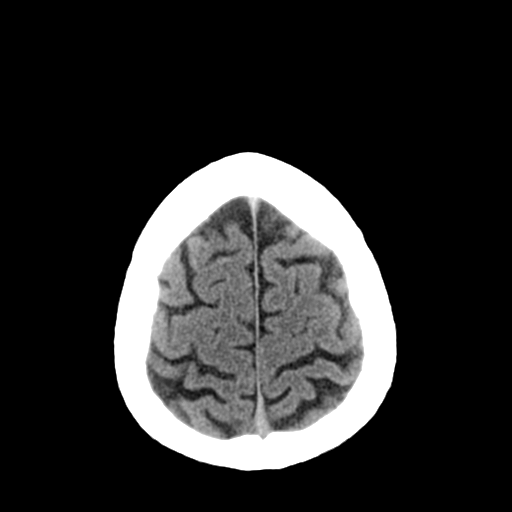
[im 27/36  bone]
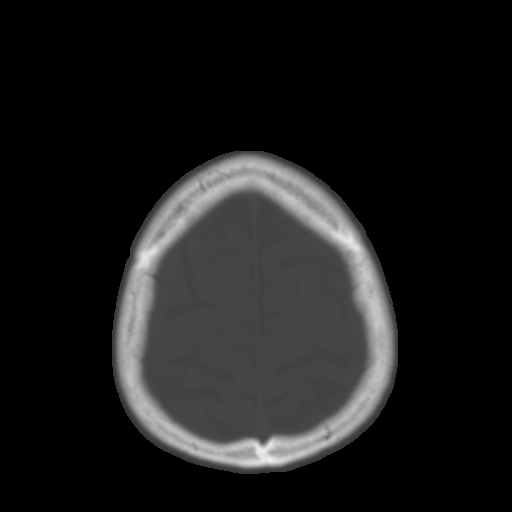
[im 29/36  brain]
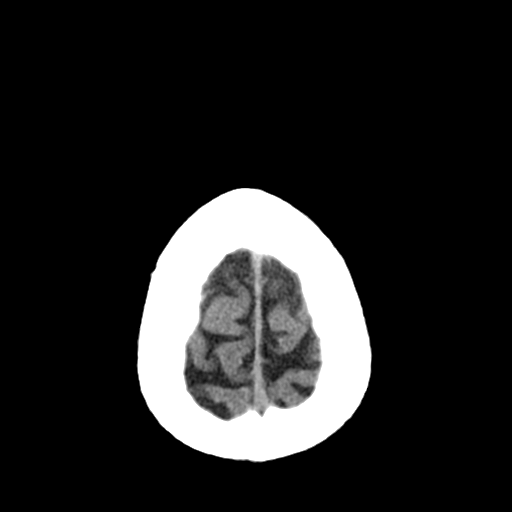
[im 32/36  brain]
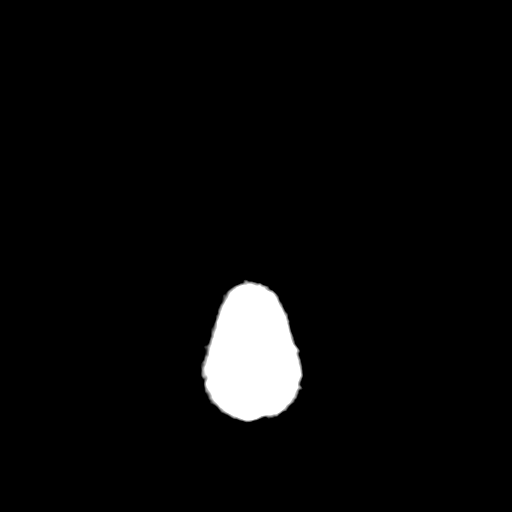
[im 34/36  brain]
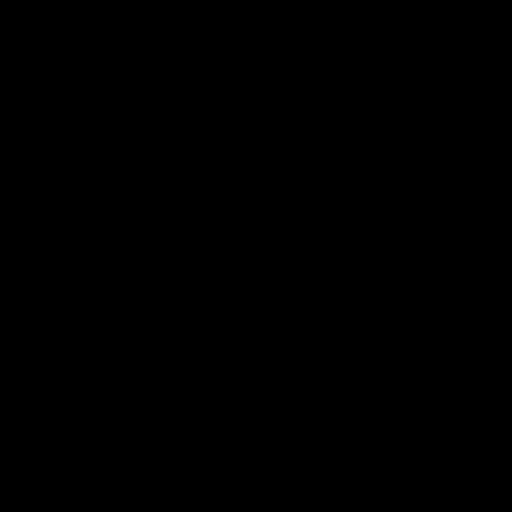

[16 of 30 positions shown; findings below may reference images not displayed]

FINDINGS: Generalized atrophy.
Normal ventricular morphology.
No midline shift or mass effect.
Otherwise normal appearance of brain parenchyma.
No intracranial hemorrhage, mass lesion, or evidence of acute
infarction.
Hypoplastic mastoid air cells.
Visualized paranasal sinuses clear.
No acute osseous findings.
IMPRESSION: Mild generalized atrophy.
No acute intracranial abnormalities.

## 2012-03-22 ENCOUNTER — Encounter: Payer: Self-pay | Admitting: Physical Medicine and Rehabilitation

## 2012-03-22 ENCOUNTER — Encounter
Payer: Medicare Other | Attending: Physical Medicine and Rehabilitation | Admitting: Physical Medicine and Rehabilitation

## 2012-03-22 VITALS — BP 153/70 | HR 104 | Resp 14 | Ht 67.0 in | Wt 180.0 lb

## 2012-03-22 DIAGNOSIS — M47817 Spondylosis without myelopathy or radiculopathy, lumbosacral region: Secondary | ICD-10-CM

## 2012-03-22 DIAGNOSIS — D219 Benign neoplasm of connective and other soft tissue, unspecified: Secondary | ICD-10-CM | POA: Insufficient documentation

## 2012-03-22 DIAGNOSIS — IMO0002 Reserved for concepts with insufficient information to code with codable children: Secondary | ICD-10-CM | POA: Insufficient documentation

## 2012-03-22 DIAGNOSIS — E1142 Type 2 diabetes mellitus with diabetic polyneuropathy: Secondary | ICD-10-CM | POA: Insufficient documentation

## 2012-03-22 DIAGNOSIS — E1149 Type 2 diabetes mellitus with other diabetic neurological complication: Secondary | ICD-10-CM

## 2012-03-22 DIAGNOSIS — M47816 Spondylosis without myelopathy or radiculopathy, lumbar region: Secondary | ICD-10-CM

## 2012-03-22 DIAGNOSIS — G8929 Other chronic pain: Secondary | ICD-10-CM | POA: Insufficient documentation

## 2012-03-22 DIAGNOSIS — E1140 Type 2 diabetes mellitus with diabetic neuropathy, unspecified: Secondary | ICD-10-CM

## 2012-03-22 MED ORDER — OXYCODONE HCL 15 MG PO TABS: 15.0000 mg | ORAL_TABLET | Freq: Three times a day (TID) | ORAL | Status: DC | PRN

## 2012-03-22 NOTE — Progress Notes (Signed)
Subjective:    Patient ID: Troy [name], male    DOB: 08/23/1945, 67 y.o.   MRN: [id number]  HPI The patient complains about chronic pain in right foot . The patient also complains about numbness and tingling in the right foot.  The problem has been stable, with having good and bad days. Having more good days last month.   Pain Inventory Average Pain 8 Pain Right Now 7 My pain is sharp and aching  In the last 24 hours, has pain interfered with the following? General activity 7 Relation with others 7 Enjoyment of life 9 What TIME of day is your pain at its worst? evening and night Sleep (in general) Fair  Pain is worse with: sitting Pain improves with: pacing activities Relief from Meds: 7  Mobility walk without assistance how many minutes can you walk? 20 ability to climb steps?  yes do you drive?  yes Do you have any goals in this area?  yes  Function disabled: date disabled 34 retired  Neuro/Psych depression  Prior Studies Any changes since last visit?  no  Physicians involved in your care Any changes since last visit?  no   Family History  Problem Relation Age of Onset  . Hypertension Mother   . Diabetes Mother   . Stroke Mother   . Hypertension Father   . Stroke Father    History   Social History  . Marital Status: Married    Spouse Name: N/A    Number of Children: N/A  . Years of Education: N/A   Social History Main Topics  . Smoking status: Never Smoker   . Smokeless tobacco: Never Used  . Alcohol Use: No  . Drug Use: No  . Sexually Active: None   Other Topics Concern  . None   Social History Narrative  . None   Past Surgical History  Procedure Date  . Cardiac surgery   . Trigger finger release   . Implantation vagal nerve stimulator   . Vagal nerve stimulator removal    Past Medical History  Diagnosis Date  . High blood pressure   . Diabetes mellitus   . Arthritis   . Plantar fasciitis    BP 153/70  Pulse 104  Resp 14   Ht 5\' 7"  (1.702 m)  Wt 180 lb (81.647 kg)  BMI 28.19 kg/m2  SpO2 98%     Review of Systems  Respiratory: Positive for shortness of breath.   Psychiatric/Behavioral: Positive for dysphoric mood.  All other systems reviewed and are negative.       Objective:   Physical Exam Very hard of hearing  Neck: Neck supple.  Musculoskeletal: He exhibits tenderness.  Neurological: He is alert and oriented to person, place, and time.  Skin: Skin is warm and dry.  Psychiatric: He has a normal mood and affect.  Symmetric normal motor tone is noted throughout. Normal muscle bulk. Muscle testing reveals 5/5 muscle strength of the upper extremity, and 5/5 of the lower extremity. Full range of motion in upper and lower extremities. ROM of spine is not restricted. Fine motor movements are normal in both hands.  Sensory is intact and symmetric to light touch, pinprick and proprioception.  DTR in the upper and lower extremity are present and symmetric 3+, right achilles tendon reflex 1+, left 2+. No clonus is noted.  Patient arises from chair without difficulty. Wide based gait with normal arm swing bilateral , able to walk on heels and toes . Tandem  walk is very unstable. No pronator drift. Rhomberg negative.  Patient is very hard of hearing.        Assessment & Plan:  1. 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  2. Diabetic peripheral neuropathy affecting both feet and affecting balance.  Continue current medications oxycodone 15 mg 3 times a day  Continue gabapentin 800 mg 4 times per day, refilled for 3 month  Pamelor qhs  Advised the patient to do some foot massage with an ice bottle or with a massage ball. Advised patient to take a warm foot bath with epson salt in the morning, and move his feet in the water.  Return to clinic one month PA visit

## 2012-03-22 NOTE — Patient Instructions (Signed)
Continue with staying active, continue with your foot massage, keep your feet warm.

## 2012-04-19 ENCOUNTER — Encounter: Payer: Self-pay | Admitting: Physical Medicine and Rehabilitation

## 2012-04-19 ENCOUNTER — Encounter
Payer: Medicare Other | Attending: Physical Medicine and Rehabilitation | Admitting: Physical Medicine and Rehabilitation

## 2012-04-19 VITALS — BP 162/68 | HR 92 | Resp 14 | Ht 67.0 in | Wt 180.0 lb

## 2012-04-19 DIAGNOSIS — E1149 Type 2 diabetes mellitus with other diabetic neurological complication: Secondary | ICD-10-CM | POA: Insufficient documentation

## 2012-04-19 DIAGNOSIS — D219 Benign neoplasm of connective and other soft tissue, unspecified: Secondary | ICD-10-CM | POA: Insufficient documentation

## 2012-04-19 DIAGNOSIS — E1140 Type 2 diabetes mellitus with diabetic neuropathy, unspecified: Secondary | ICD-10-CM

## 2012-04-19 DIAGNOSIS — R279 Unspecified lack of coordination: Secondary | ICD-10-CM | POA: Insufficient documentation

## 2012-04-19 DIAGNOSIS — M545 Low back pain, unspecified: Secondary | ICD-10-CM

## 2012-04-19 DIAGNOSIS — IMO0002 Reserved for concepts with insufficient information to code with codable children: Secondary | ICD-10-CM | POA: Insufficient documentation

## 2012-04-19 DIAGNOSIS — E1142 Type 2 diabetes mellitus with diabetic polyneuropathy: Secondary | ICD-10-CM | POA: Insufficient documentation

## 2012-04-19 DIAGNOSIS — M79609 Pain in unspecified limb: Secondary | ICD-10-CM | POA: Insufficient documentation

## 2012-04-19 DIAGNOSIS — G8929 Other chronic pain: Secondary | ICD-10-CM | POA: Insufficient documentation

## 2012-04-19 MED ORDER — OXYCODONE HCL 15 MG PO TABS
15.0000 mg | ORAL_TABLET | Freq: Three times a day (TID) | ORAL | Status: DC | PRN
Start: 2012-04-19 — End: 2012-05-15

## 2012-04-19 NOTE — Progress Notes (Signed)
Subjective:    Patient ID: Troy [name], male    DOB: 1945/10/20, 67 y.o.   MRN: [id number]  HPI The patient complains about chronic pain in right foot . The patient also complains about numbness and tingling in the right foot.  The problem has been stable, with having good and bad days. Having more bad days last month, because of the cold weather.   Pain Inventory Average Pain 8 Pain Right Now 10 My pain is sharp and tingling  In the last 24 hours, has pain interfered with the following? General activity 0 Relation with others 0 Enjoyment of life 0 What TIME of day is your pain at its worst? morning, day and evening Sleep (in general) Fair  Pain is worse with: walking Pain improves with: medication Relief from Meds: 5  Mobility walk without assistance how many minutes can you walk? 20 do you drive?  yes Do you have any goals in this area?  yes  Function not employed: date last employed 11 retired Do you have any goals in this area?  yes  Neuro/Psych numbness  Prior Studies Any changes since last visit?  no  Physicians involved in your care Any changes since last visit?  no   Family History  Problem Relation Age of Onset  . Hypertension Mother   . Diabetes Mother   . Stroke Mother   . Hypertension Father   . Stroke Father    History   Social History  . Marital Status: Married    Spouse Name: N/A    Number of Children: N/A  . Years of Education: N/A   Social History Main Topics  . Smoking status: Never Smoker   . Smokeless tobacco: Never Used  . Alcohol Use: No  . Drug Use: No  . Sexually Active: None   Other Topics Concern  . None   Social History Narrative  . None   Past Surgical History  Procedure Laterality Date  . Cardiac surgery    . Trigger finger release    . Implantation vagal nerve stimulator    . Vagal nerve stimulator removal     Past Medical History  Diagnosis Date  . High blood pressure   . Diabetes mellitus   .  Arthritis   . Plantar fasciitis    BP 162/68  Pulse 92  Resp 14  Ht 5\' 7"  (1.702 m)  Wt 180 lb (81.647 kg)  BMI 28.19 kg/m2  SpO2 95%     Review of Systems  Constitutional: Positive for unexpected weight change.  Respiratory: Positive for apnea.   Neurological: Positive for numbness.  All other systems reviewed and are negative.       Objective:   Physical Exam Very hard of hearing  Neck: Neck supple.  Musculoskeletal: He exhibits tenderness.  Neurological: He is alert and oriented to person, place, and time.  Skin: Skin is warm and dry.  Psychiatric: He has a normal mood and affect.  Symmetric normal motor tone is noted throughout. Normal muscle bulk. Muscle testing reveals 5/5 muscle strength of the upper extremity, and 5/5 of the lower extremity. Full range of motion in upper and lower extremities. ROM of spine is not restricted. Fine motor movements are normal in both hands.  Sensory is intact and symmetric to light touch, pinprick and proprioception.  DTR in the upper and lower extremity are present and symmetric 3+, right achilles tendon reflex 1+, left 2+. No clonus is noted.  Patient arises from chair  without difficulty. Wide based gait with normal arm swing bilateral , able to walk on heels and toes . Tandem walk is very unstable. No pronator drift. Rhomberg negative.  Patient is very hard of hearing.        Assessment & Plan:  1. 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  2. Diabetic peripheral neuropathy affecting both feet and affecting balance.  Continue current medications oxycodone 15 mg 3 times a day  Continue gabapentin 800 mg 4 times per day, refilled for 3 month  Pamelor qhs  Advised the patient to do some foot massage with an ice bottle or with a massage ball. Advised patient to take a warm foot bath with epson salt in the morning, and move his feet in the water.  Return to clinic one month PA visit

## 2012-04-19 NOTE — Patient Instructions (Signed)
Continue with staying active, continue with your foot massage.

## 2012-05-15 ENCOUNTER — Encounter
Payer: Medicare Other | Attending: Physical Medicine and Rehabilitation | Admitting: Physical Medicine and Rehabilitation

## 2012-05-15 ENCOUNTER — Encounter: Payer: Self-pay | Admitting: Physical Medicine and Rehabilitation

## 2012-05-15 VITALS — BP 162/75 | HR 101 | Resp 14 | Ht 67.0 in | Wt 176.0 lb

## 2012-05-15 DIAGNOSIS — E1149 Type 2 diabetes mellitus with other diabetic neurological complication: Secondary | ICD-10-CM | POA: Insufficient documentation

## 2012-05-15 DIAGNOSIS — E1142 Type 2 diabetes mellitus with diabetic polyneuropathy: Secondary | ICD-10-CM | POA: Insufficient documentation

## 2012-05-15 DIAGNOSIS — Z79899 Other long term (current) drug therapy: Secondary | ICD-10-CM

## 2012-05-15 DIAGNOSIS — Z5181 Encounter for therapeutic drug level monitoring: Secondary | ICD-10-CM

## 2012-05-15 DIAGNOSIS — M545 Low back pain, unspecified: Secondary | ICD-10-CM

## 2012-05-15 DIAGNOSIS — D219 Benign neoplasm of connective and other soft tissue, unspecified: Secondary | ICD-10-CM | POA: Insufficient documentation

## 2012-05-15 DIAGNOSIS — IMO0002 Reserved for concepts with insufficient information to code with codable children: Secondary | ICD-10-CM | POA: Insufficient documentation

## 2012-05-15 MED ORDER — OXYCODONE HCL 15 MG PO TABS: 15.0000 mg | ORAL_TABLET | Freq: Three times a day (TID) | ORAL | Status: DC | PRN

## 2012-05-15 NOTE — Patient Instructions (Signed)
Stay as active as tolerated, continue with your foot massage , baths .

## 2012-05-15 NOTE — Progress Notes (Signed)
Subjective:    Patient ID: Troy [name], male    DOB: Jan 20, 1946, 67 y.o.   MRN: [id number]  HPI The patient complains about chronic pain in right foot . The patient also complains about numbness and tingling in the right foot.  The problem has been stable, with having good and bad days.Today he has a good day.   Pain Inventory Average Pain 8 Pain Right Now 8 My pain is aching  In the last 24 hours, has pain interfered with the following? General activity 7 Relation with others 8 Enjoyment of life 8 What TIME of day is your pain at its worst? varies Sleep (in general) Fair  Pain is worse with: some activites Pain improves with: medication Relief from Meds: 8  Mobility how many minutes can you walk? 20 ability to climb steps?  yes do you drive?  yes  Function retired  Neuro/Psych weakness numbness  Prior Studies Any changes since last visit?  no  Physicians involved in your care Any changes since last visit?  no   Family History  Problem Relation Age of Onset  . Hypertension Mother   . Diabetes Mother   . Stroke Mother   . Hypertension Father   . Stroke Father    History   Social History  . Marital Status: Married    Spouse Name: N/A    Number of Children: N/A  . Years of Education: N/A   Social History Main Topics  . Smoking status: Never Smoker   . Smokeless tobacco: Never Used  . Alcohol Use: No  . Drug Use: No  . Sexually Active: None   Other Topics Concern  . None   Social History Narrative  . None   Past Surgical History  Procedure Laterality Date  . Cardiac surgery    . Trigger finger release    . Implantation vagal nerve stimulator    . Vagal nerve stimulator removal     Past Medical History  Diagnosis Date  . High blood pressure   . Diabetes mellitus   . Arthritis   . Plantar fasciitis    BP 162/75  Pulse 101  Resp 14  Ht 5\' 7"  (1.702 m)  Wt 176 lb (79.833 kg)  BMI 27.56 kg/m2  SpO2 96%     Review of Systems   Respiratory: Positive for apnea.   Gastrointestinal: Positive for constipation.  Neurological: Positive for weakness and numbness.  Psychiatric/Behavioral: Positive for dysphoric mood.  All other systems reviewed and are negative.       Objective:   Physical Exam Very hard of hearing  Neck: Neck supple.  Musculoskeletal: He exhibits tenderness.  Neurological: He is alert and oriented to person, place, and time.  Skin: Skin is warm and dry.  Psychiatric: He has a normal mood and affect.  Symmetric normal motor tone is noted throughout. Normal muscle bulk. Muscle testing reveals 5/5 muscle strength of the upper extremity, and 5/5 of the lower extremity. Full range of motion in upper and lower extremities. ROM of spine is not restricted. Fine motor movements are normal in both hands.  Sensory is intact and symmetric to light touch, pinprick and proprioception.  DTR in the upper and lower extremity are present and symmetric 3+, right achilles tendon reflex 1+, left 2+. No clonus is noted.  Patient arises from chair without difficulty. Wide based gait with normal arm swing bilateral , able to walk on heels and toes . Tandem walk is very unstable. No pronator  drift. Rhomberg negative.  Patient is very hard of hearing.        Assessment & Plan:  1. 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  2. Diabetic peripheral neuropathy affecting both feet and affecting balance.  Continue current medications oxycodone 15 mg 3 times a day  Continue gabapentin 800 mg 4 times per day, refilled for 3 month  Pamelor qhs  Advised the patient to do some foot massage with an ice bottle or with a massage ball. Advised patient to take a warm foot bath with epson salt in the morning, and move his feet in the water.  Return to clinic one month PA visit

## 2012-06-12 ENCOUNTER — Encounter: Payer: Self-pay | Admitting: Physical Medicine and Rehabilitation

## 2012-06-12 ENCOUNTER — Encounter
Payer: Medicare Other | Attending: Physical Medicine and Rehabilitation | Admitting: Physical Medicine and Rehabilitation

## 2012-06-12 VITALS — BP 117/75 | HR 91 | Resp 14 | Ht 67.0 in | Wt 178.4 lb

## 2012-06-12 DIAGNOSIS — E1149 Type 2 diabetes mellitus with other diabetic neurological complication: Secondary | ICD-10-CM | POA: Insufficient documentation

## 2012-06-12 DIAGNOSIS — IMO0002 Reserved for concepts with insufficient information to code with codable children: Secondary | ICD-10-CM | POA: Insufficient documentation

## 2012-06-12 DIAGNOSIS — D219 Benign neoplasm of connective and other soft tissue, unspecified: Secondary | ICD-10-CM | POA: Insufficient documentation

## 2012-06-12 DIAGNOSIS — E1142 Type 2 diabetes mellitus with diabetic polyneuropathy: Secondary | ICD-10-CM | POA: Insufficient documentation

## 2012-06-12 MED ORDER — OXYCODONE HCL 15 MG PO TABS: 15.0000 mg | ORAL_TABLET | Freq: Three times a day (TID) | ORAL | Status: DC | PRN

## 2012-06-12 NOTE — Progress Notes (Signed)
Subjective:    Patient ID: Troy [name], male    DOB: Jan 16, 1946, 67 y.o.   MRN: [id number]  HPI The patient complains about chronic pain in right foot . The patient also complains about numbness and tingling in the right foot.  The problem has been stable, with having good and bad days.Today he has a good day.   Pain Inventory Average Pain 8 Pain Right Now 7 My pain is aching  In the last 24 hours, has pain interfered with the following? General activity 7 Relation with others 8 Enjoyment of life 8 What TIME of day is your pain at its worst? daytime and evening Sleep (in general) Fair  Pain is worse with: walking and some activites Pain improves with: medication Relief from Meds: 6  Mobility walk without assistance how many minutes can you walk? 20 ability to climb steps?  yes do you drive?  yes  Function disabled: date disabled 05/19/09 retired  Neuro/Psych depression  Prior Studies Any changes since last visit?  no  Physicians involved in your care Any changes since last visit?  no   Family History  Problem Relation Age of Onset  . Hypertension Mother   . Diabetes Mother   . Stroke Mother   . Hypertension Father   . Stroke Father    History   Social History  . Marital Status: Married    Spouse Name: N/A    Number of Children: N/A  . Years of Education: N/A   Social History Main Topics  . Smoking status: Never Smoker   . Smokeless tobacco: Never Used  . Alcohol Use: No  . Drug Use: No  . Sexually Active: None   Other Topics Concern  . None   Social History Narrative  . None   Past Surgical History  Procedure Laterality Date  . Cardiac surgery    . Trigger finger release    . Implantation vagal nerve stimulator    . Vagal nerve stimulator removal     Past Medical History  Diagnosis Date  . High blood pressure   . Diabetes mellitus   . Arthritis   . Plantar fasciitis    BP 117/75  Pulse 91  Resp 14  Ht 5\' 7"  (1.702 m)  Wt 178  lb 6.4 oz (80.922 kg)  BMI 27.93 kg/m2  SpO2 94%    Review of Systems  Respiratory: Positive for apnea.   Genitourinary: Positive for difficulty urinating.  Musculoskeletal:       Foot pain right side  All other systems reviewed and are negative.       Objective:   Physical Exam Very hard of hearing  Neck: Neck supple.  Musculoskeletal: He exhibits tenderness.  Neurological: He is alert and oriented to person, place, and time.  Skin: Skin is warm and dry.  Psychiatric: He has a normal mood and affect.  Symmetric normal motor tone is noted throughout. Normal muscle bulk. Muscle testing reveals 5/5 muscle strength of the upper extremity, and 5/5 of the lower extremity, except peroneus muscles on the right 4-/5. Full range of motion in upper and lower extremities. ROM of spine is not restricted. Fine motor movements are normal in both hands.  Sensory is intact and symmetric to light touch, pinprick and proprioception, except in the distribution of the peroneal nerve on the right.  DTR in the upper and lower extremity are present and symmetric 3+, right achilles tendon reflex 1+, left 2+. No clonus is noted.  Patient  arises from chair without difficulty. Wide based gait with normal arm swing bilateral , able to walk on heels and toes . Tandem walk is very unstable. No pronator drift. Rhomberg negative.  Patient is very hard of hearing.        Assessment & Plan:  1. 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  2. Diabetic peripheral neuropathy affecting both feet and affecting balance.  Continue current medications oxycodone 15 mg 3 times a day  Continue gabapentin 800 mg 4 times per day, refilled for 3 month  Pamelor qhs  Advised the patient to do some foot massage with an ice bottle or with a massage ball. Advised patient to take a warm foot bath with epson salt in the morning, and move his feet in the water.  Return to clinic one month PA visit

## 2012-06-12 NOTE — Patient Instructions (Signed)
Continue with your foot massage and your walking

## 2012-07-11 ENCOUNTER — Encounter: Payer: Self-pay | Admitting: Physical Medicine and Rehabilitation

## 2012-07-11 ENCOUNTER — Encounter
Payer: Medicare Other | Attending: Physical Medicine and Rehabilitation | Admitting: Physical Medicine and Rehabilitation

## 2012-07-11 VITALS — BP 144/57 | HR 105 | Resp 14 | Ht 67.0 in | Wt 177.0 lb

## 2012-07-11 DIAGNOSIS — IMO0002 Reserved for concepts with insufficient information to code with codable children: Secondary | ICD-10-CM | POA: Insufficient documentation

## 2012-07-11 DIAGNOSIS — R209 Unspecified disturbances of skin sensation: Secondary | ICD-10-CM | POA: Insufficient documentation

## 2012-07-11 DIAGNOSIS — M79609 Pain in unspecified limb: Secondary | ICD-10-CM | POA: Insufficient documentation

## 2012-07-11 DIAGNOSIS — E1149 Type 2 diabetes mellitus with other diabetic neurological complication: Secondary | ICD-10-CM | POA: Insufficient documentation

## 2012-07-11 DIAGNOSIS — D219 Benign neoplasm of connective and other soft tissue, unspecified: Secondary | ICD-10-CM | POA: Insufficient documentation

## 2012-07-11 DIAGNOSIS — M545 Low back pain, unspecified: Secondary | ICD-10-CM | POA: Insufficient documentation

## 2012-07-11 DIAGNOSIS — G8929 Other chronic pain: Secondary | ICD-10-CM | POA: Insufficient documentation

## 2012-07-11 DIAGNOSIS — E1142 Type 2 diabetes mellitus with diabetic polyneuropathy: Secondary | ICD-10-CM | POA: Insufficient documentation

## 2012-07-11 DIAGNOSIS — E1140 Type 2 diabetes mellitus with diabetic neuropathy, unspecified: Secondary | ICD-10-CM

## 2012-07-11 MED ORDER — OXYCODONE HCL 15 MG PO TABS: 15.0000 mg | ORAL_TABLET | Freq: Three times a day (TID) | ORAL | Status: DC | PRN

## 2012-07-11 NOTE — Patient Instructions (Signed)
Ask your nephrologist whether you are allowed to use Voltaren gel for your foot and back pain

## 2012-07-11 NOTE — Progress Notes (Signed)
Subjective:    Patient ID: Troy [name], male    DOB: 1945/11/15, 67 y.o.   MRN: [id number]  HPI The patient complains about chronic pain in right foot . The patient also complains about numbness and tingling in the right foot.  The problem has been stable, with having good and bad days.Today he also complains about mild LBP after doing to much in his garden, this pain usually resolves after a couple days.  Pain Inventory Average Pain 8 Pain Right Now 7 My pain is burning and aching  In the last 24 hours, has pain interfered with the following? General activity 7 Relation with others 8 Enjoyment of life 7 What TIME of day is your pain at its worst? morning and evening Sleep (in general) Fair  Pain is worse with: walking and some activites Pain improves with: rest and medication Relief from Meds: 7  Mobility walk without assistance how many minutes can you walk? 20 ability to climb steps?  yes do you drive?  yes Do you have any goals in this area?  yes  Function disabled: date disabled 03/11 retired Do you have any goals in this area?  yes  Neuro/Psych No problems in this area  Prior Studies Any changes since last visit?  no  Physicians involved in your care Any changes since last visit?  no   Family History  Problem Relation Age of Onset  . Hypertension Mother   . Diabetes Mother   . Stroke Mother   . Hypertension Father   . Stroke Father    History   Social History  . Marital Status: Married    Spouse Name: N/A    Number of Children: N/A  . Years of Education: N/A   Social History Main Topics  . Smoking status: Never Smoker   . Smokeless tobacco: Never Used  . Alcohol Use: No  . Drug Use: No  . Sexually Active: None   Other Topics Concern  . None   Social History Narrative  . None   Past Surgical History  Procedure Laterality Date  . Cardiac surgery    . Trigger finger release    . Implantation vagal nerve stimulator    . Vagal nerve  stimulator removal     Past Medical History  Diagnosis Date  . High blood pressure   . Diabetes mellitus   . Arthritis   . Plantar fasciitis    BP 144/57  Pulse 105  Resp 14  Ht 5\' 7"  (1.702 m)  Wt 177 lb (80.287 kg)  BMI 27.72 kg/m2  SpO2 94%     Review of Systems  Respiratory: Positive for apnea.   All other systems reviewed and are negative.       Objective:   Physical Exam Very hard of hearing  Neck: Neck supple.  Musculoskeletal: He exhibits tenderness.  Neurological: He is alert and oriented to person, place, and time.  Skin: Skin is warm and dry.  Psychiatric: He has a normal mood and affect.  Symmetric normal motor tone is noted throughout. Normal muscle bulk. Muscle testing reveals 5/5 muscle strength of the upper extremity, and 5/5 of the lower extremity, except peroneus muscles on the right 4-/5. Full range of motion in upper and lower extremities. ROM of spine is not restricted. Fine motor movements are normal in both hands.  Sensory is intact and symmetric to light touch, pinprick and proprioception, except in the distribution of the peroneal nerve on the right.  DTR  in the upper and lower extremity are present and symmetric 3+, right achilles tendon reflex 1+, left 2+. No clonus is noted.  Patient arises from chair without difficulty. Wide based gait with normal arm swing bilateral , able to walk on heels and toes . Tandem walk is very unstable. No pronator drift. Rhomberg negative.  Patient is very hard of hearing.        Assessment & Plan:  1. 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  2. Diabetic peripheral neuropathy affecting both feet and affecting balance.  3. Mild LBP after doing to much yard work, which resolves usually over time. Continue current medications oxycodone 15 mg 3 times a day  Continue gabapentin 800 mg 4 times per day, refilled for 3 month  Pamelor qhs  Advised the patient to do some foot massage with an ice bottle or  with a massage ball. Advised patient to take a warm foot bath with epson salt in the morning, and move his feet in the water. Advised patient to ask his nephrologist, whether it would be ok for him to use Voltaren gel for his foot pain and his back pain. Return to clinic one month PA visit

## 2012-08-05 ENCOUNTER — Other Ambulatory Visit: Payer: Self-pay | Admitting: *Deleted

## 2012-08-05 MED ORDER — GABAPENTIN 400 MG PO CAPS: 800.0000 mg | ORAL_CAPSULE | Freq: Four times a day (QID) | ORAL | Status: DC

## 2012-08-09 ENCOUNTER — Encounter: Payer: Self-pay | Admitting: Physical Medicine and Rehabilitation

## 2012-08-09 ENCOUNTER — Encounter
Payer: Medicare Other | Attending: Physical Medicine and Rehabilitation | Admitting: Physical Medicine and Rehabilitation

## 2012-08-09 VITALS — BP 84/43 | HR 62 | Resp 14 | Ht 67.0 in | Wt 180.0 lb

## 2012-08-09 DIAGNOSIS — G8929 Other chronic pain: Secondary | ICD-10-CM | POA: Insufficient documentation

## 2012-08-09 DIAGNOSIS — D219 Benign neoplasm of connective and other soft tissue, unspecified: Secondary | ICD-10-CM | POA: Insufficient documentation

## 2012-08-09 DIAGNOSIS — E1142 Type 2 diabetes mellitus with diabetic polyneuropathy: Secondary | ICD-10-CM | POA: Insufficient documentation

## 2012-08-09 DIAGNOSIS — E1149 Type 2 diabetes mellitus with other diabetic neurological complication: Secondary | ICD-10-CM

## 2012-08-09 DIAGNOSIS — M5416 Radiculopathy, lumbar region: Secondary | ICD-10-CM

## 2012-08-09 DIAGNOSIS — IMO0002 Reserved for concepts with insufficient information to code with codable children: Secondary | ICD-10-CM

## 2012-08-09 DIAGNOSIS — M545 Low back pain, unspecified: Secondary | ICD-10-CM | POA: Insufficient documentation

## 2012-08-09 MED ORDER — OXYCODONE HCL 15 MG PO TABS: 15.0000 mg | ORAL_TABLET | Freq: Three times a day (TID) | ORAL | Status: DC | PRN

## 2012-08-09 NOTE — Patient Instructions (Addendum)
Continue with your walking program, try to walk and exercise in your pool.

## 2012-08-09 NOTE — Progress Notes (Signed)
Subjective:    Patient ID: Troy [name], male    DOB: 1945/10/27, 67 y.o.   MRN: [id number]  HPI The patient complains about chronic pain in right foot . The patient also complains about numbness and tingling in the right foot.  The problem has been stable, with having good and bad days.Today he also complains about mild LBP after doing too much in his garden, this pain usually resolves after a couple days.He states, that his BS was a little low last week, because he did not eat regularly, but still used his insulin the same.  Pain Inventory Average Pain 7 Pain Right Now 7 My pain is burning and dull  In the last 24 hours, has pain interfered with the following? General activity 7 Relation with others 7 Enjoyment of life 8 What TIME of day is your pain at its worst? evening Sleep (in general) Fair  Pain is worse with: walking and sitting Pain improves with: medication Relief from Meds: 8  Mobility ability to climb steps?  yes do you drive?  yes  Function retired  Neuro/Psych depression  Prior Studies Any changes since last visit?  no  Physicians involved in your care Any changes since last visit?  no   Family History  Problem Relation Age of Onset  . Hypertension Mother   . Diabetes Mother   . Stroke Mother   . Hypertension Father   . Stroke Father    History   Social History  . Marital Status: Married    Spouse Name: N/A    Number of Children: N/A  . Years of Education: N/A   Social History Main Topics  . Smoking status: Never Smoker   . Smokeless tobacco: Never Used  . Alcohol Use: No  . Drug Use: No  . Sexually Active: None   Other Topics Concern  . None   Social History Narrative  . None   Past Surgical History  Procedure Laterality Date  . Cardiac surgery    . Trigger finger release    . Implantation vagal nerve stimulator    . Vagal nerve stimulator removal     Past Medical History  Diagnosis Date  . High blood pressure   .  Diabetes mellitus   . Arthritis   . Plantar fasciitis    BP 84/43  Pulse 62  Resp 14  Ht 5\' 7"  (1.702 m)  Wt 180 lb (81.647 kg)  BMI 28.19 kg/m2  SpO2 96%     Review of Systems  Musculoskeletal: Positive for gait problem.  Neurological: Positive for dizziness.  Psychiatric/Behavioral: Positive for dysphoric mood.  All other systems reviewed and are negative.       Objective:   Physical Exam Very hard of hearing  Neck: Neck supple.  Musculoskeletal: He exhibits tenderness.  Neurological: He is alert and oriented to person, place, and time.  Skin: Skin is warm and dry.  Psychiatric: He has a normal mood and affect.  Symmetric normal motor tone is noted throughout. Normal muscle bulk. Muscle testing reveals 5/5 muscle strength of the upper extremity, and 5/5 of the lower extremity, except peroneus muscles on the right 4-/5. Full range of motion in upper and lower extremities. ROM of spine is not restricted. Fine motor movements are normal in both hands.  Sensory is intact and symmetric to light touch, pinprick and proprioception, except in the distribution of the peroneal nerve on the right.  DTR in the upper and lower extremity are present and  symmetric 3+, right achilles tendon reflex 1+, left 2+. No clonus is noted.  Patient arises from chair without difficulty. Wide based gait with normal arm swing bilateral , able to walk on heels and toes . Tandem walk is very unstable. No pronator drift. Rhomberg negative.  Patient is very hard of hearing.        Assessment & Plan:  1. 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  2. Diabetic peripheral neuropathy affecting both feet and affecting balance. Advised patient and his wife to make sure that the patient eats regularly, also that they should call his endocrinologist to get instructions how to use the insulin in those cases, when his BS drops. 3. Mild LBP after doing to much yard work, which resolves usually over time.   Continue current medications oxycodone 15 mg 3 times a day  Continue gabapentin 800 mg 4 times per day, refilled for 3 month  Pamelor qhs  Advised the patient to do some foot massage with an ice bottle or with a massage ball. Advised patient to take a warm foot bath with epson salt in the morning, and move his feet in the water. Advised patient to ask his nephrologist, whether it would be ok for him to use Voltaren gel for his foot pain and his back pain. Recommended walking and exercising in his pool, if somebody is there with him. Return to clinic one month PA visit

## 2012-09-09 ENCOUNTER — Encounter
Payer: Medicare Other | Attending: Physical Medicine and Rehabilitation | Admitting: Physical Medicine and Rehabilitation

## 2012-09-09 ENCOUNTER — Encounter: Payer: Self-pay | Admitting: Physical Medicine and Rehabilitation

## 2012-09-09 VITALS — BP 92/48 | HR 63 | Resp 16 | Ht 67.0 in | Wt 174.0 lb

## 2012-09-09 DIAGNOSIS — E1142 Type 2 diabetes mellitus with diabetic polyneuropathy: Secondary | ICD-10-CM | POA: Insufficient documentation

## 2012-09-09 DIAGNOSIS — G8929 Other chronic pain: Secondary | ICD-10-CM | POA: Insufficient documentation

## 2012-09-09 DIAGNOSIS — M545 Low back pain, unspecified: Secondary | ICD-10-CM | POA: Insufficient documentation

## 2012-09-09 DIAGNOSIS — E1149 Type 2 diabetes mellitus with other diabetic neurological complication: Secondary | ICD-10-CM

## 2012-09-09 DIAGNOSIS — IMO0002 Reserved for concepts with insufficient information to code with codable children: Secondary | ICD-10-CM | POA: Insufficient documentation

## 2012-09-09 DIAGNOSIS — E1140 Type 2 diabetes mellitus with diabetic neuropathy, unspecified: Secondary | ICD-10-CM

## 2012-09-09 MED ORDER — OXYCODONE HCL 15 MG PO TABS: 15.0000 mg | ORAL_TABLET | Freq: Three times a day (TID) | ORAL | Status: DC | PRN

## 2012-09-09 NOTE — Progress Notes (Signed)
Subjective:    Patient ID: Troy [name], male    DOB: [birth year], 67 y.o.   MRN: [id number]  HPI The patient complains about chronic pain in right foot . The patient also complains about numbness and tingling in the right foot.  The problem has been stable, with having good and bad days.Today he also complains about mild LBP after doing too much in his garden, this pain usually resolves after a couple days.He states, that his BS was a little low last week, because he did not eat regularly, but still used his insulin the same.It is now in better control again.  Pain Inventory Average Pain 8 Pain Right Now 10 My pain is sharp, burning and aching  In the last 24 hours, has pain interfered with the following? General activity 7 Relation with others 7 Enjoyment of life 6 What TIME of day is your pain at its worst? morning and evening Sleep (in general) Fair  Pain is worse with: some activites Pain improves with: rest and medication Relief from Meds: 8  Mobility walk without assistance how many minutes can you walk? 20  Function disabled: date disabled 2011 retired  Neuro/Psych depression  Prior Studies Any changes since last visit?  no  Physicians involved in your care Any changes since last visit?  no   Family History  Problem Relation Age of Onset  . Hypertension Mother   . Diabetes Mother   . Stroke Mother   . Hypertension Father   . Stroke Father    History   Social History  . Marital Status: Married    Spouse Name: N/A    Number of Children: N/A  . Years of Education: N/A   Social History Main Topics  . Smoking status: Never Smoker   . Smokeless tobacco: Never Used  . Alcohol Use: No  . Drug Use: No  . Sexually Active: None   Other Topics Concern  . None   Social History Narrative  . None   Past Surgical History  Procedure Laterality Date  . Cardiac surgery    . Trigger finger release    . Implantation vagal nerve stimulator    . Vagal  nerve stimulator removal     Past Medical History  Diagnosis Date  . High blood pressure   . Diabetes mellitus   . Arthritis   . Plantar fasciitis    BP 92/48  Pulse 63  Resp 16  Ht 5\' 7"  (1.702 m)  Wt 174 lb (78.926 kg)  BMI 27.25 kg/m2  SpO2 93%     Review of Systems  Respiratory: Positive for apnea.   Psychiatric/Behavioral: Positive for dysphoric mood.  All other systems reviewed and are negative.       Objective:   Physical Exam Very hard of hearing  Neck: Neck supple.  Musculoskeletal: He exhibits tenderness.  Neurological: He is alert and oriented to person, place, and time.  Skin: Skin is warm and dry.  Psychiatric: He has a normal mood and affect.  Symmetric normal motor tone is noted throughout. Normal muscle bulk. Muscle testing reveals 5/5 muscle strength of the upper extremity, and 5/5 of the lower extremity, except peroneus muscles on the right 4-/5. Full range of motion in upper and lower extremities. ROM of spine is not restricted. Fine motor movements are normal in both hands.  Sensory is intact and symmetric to light touch, pinprick and proprioception, except in the distribution of the peroneal nerve on the right.  DTR in  the upper and lower extremity are present and symmetric 3+, right achilles tendon reflex 1+, left 2+. No clonus is noted.  Patient arises from chair without difficulty. Wide based gait with normal arm swing bilateral , able to walk on heels and toes . Tandem walk is very unstable. No pronator drift. Rhomberg negative.  Patient is very hard of hearing.        Assessment & Plan:  1. 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  2. Diabetic peripheral neuropathy affecting both feet and affecting balance. Advised patient and his wife to make sure that the patient eats regularly, also that they should call his endocrinologist to get instructions how to use the insulin in those cases, when his BS drops.  3. Mild LBP after doing to  much yard work, which resolves usually over time.  Continue current medications oxycodone 15 mg 3 times a day  Continue gabapentin 800 mg 4 times per day, refilled for 3 month  Pamelor qhs  Advised the patient to do some foot massage with an ice bottle or with a massage ball. Advised patient to take a warm foot bath with epson salt in the morning, and move his feet in the water. Advised patient to ask his nephrologist, whether it would be ok for him to use Voltaren gel for his foot pain and his back pain. Recommended walking and exercising in his pool, if somebody is there with him.  Return to clinic one month PA visit

## 2012-09-09 NOTE — Patient Instructions (Signed)
Stay as active as tolerated. 

## 2012-10-15 ENCOUNTER — Encounter: Payer: Self-pay | Admitting: Physical Medicine and Rehabilitation

## 2012-10-15 ENCOUNTER — Encounter
Payer: Medicare Other | Attending: Physical Medicine and Rehabilitation | Admitting: Physical Medicine and Rehabilitation

## 2012-10-15 VITALS — BP 132/65 | HR 84 | Resp 14 | Ht 67.0 in | Wt 170.0 lb

## 2012-10-15 DIAGNOSIS — D219 Benign neoplasm of connective and other soft tissue, unspecified: Secondary | ICD-10-CM | POA: Insufficient documentation

## 2012-10-15 DIAGNOSIS — IMO0002 Reserved for concepts with insufficient information to code with codable children: Secondary | ICD-10-CM | POA: Insufficient documentation

## 2012-10-15 DIAGNOSIS — G8929 Other chronic pain: Secondary | ICD-10-CM | POA: Insufficient documentation

## 2012-10-15 DIAGNOSIS — M79609 Pain in unspecified limb: Secondary | ICD-10-CM | POA: Insufficient documentation

## 2012-10-15 DIAGNOSIS — R209 Unspecified disturbances of skin sensation: Secondary | ICD-10-CM | POA: Insufficient documentation

## 2012-10-15 DIAGNOSIS — M545 Low back pain, unspecified: Secondary | ICD-10-CM | POA: Insufficient documentation

## 2012-10-15 DIAGNOSIS — Z79899 Other long term (current) drug therapy: Secondary | ICD-10-CM | POA: Insufficient documentation

## 2012-10-15 DIAGNOSIS — E1142 Type 2 diabetes mellitus with diabetic polyneuropathy: Secondary | ICD-10-CM

## 2012-10-15 DIAGNOSIS — E1149 Type 2 diabetes mellitus with other diabetic neurological complication: Secondary | ICD-10-CM

## 2012-10-15 MED ORDER — OXYCODONE HCL 15 MG PO TABS: 15.0000 mg | ORAL_TABLET | Freq: Three times a day (TID) | ORAL | Status: DC | PRN

## 2012-10-15 NOTE — Patient Instructions (Signed)
Continue with your walking

## 2012-10-15 NOTE — Progress Notes (Signed)
Subjective:    Patient ID: Troy [name], male    DOB: June 11, 1945, 67 y.o.   MRN: [id number]  HPI The patient complains about chronic pain in right foot . The patient also complains about numbness and tingling in the right foot.  The problem has been stable, with having good and bad days.  Pain Inventory Average Pain 8 Pain Right Now 8 My pain is dull and aching  In the last 24 hours, has pain interfered with the following? General activity 8 Relation with others 7 Enjoyment of life 8 What TIME of day is your pain at its worst? morning and evening Sleep (in general) Fair  Pain is worse with: walking Pain improves with: medication Relief from Meds: 7  Mobility how many minutes can you walk? 20 ability to climb steps?  yes do you drive?  yes Do you have any goals in this area?  yes  Function retired Do you have any goals in this area?  yes  Neuro/Psych depression  Prior Studies Any changes since last visit?  no  Physicians involved in your care Any changes since last visit?  no   Family History  Problem Relation Age of Onset  . Hypertension Mother   . Diabetes Mother   . Stroke Mother   . Hypertension Father   . Stroke Father    History   Social History  . Marital Status: Married    Spouse Name: N/A    Number of Children: N/A  . Years of Education: N/A   Social History Main Topics  . Smoking status: Never Smoker   . Smokeless tobacco: Never Used  . Alcohol Use: No  . Drug Use: No  . Sexual Activity: None   Other Topics Concern  . None   Social History Narrative  . None   Past Surgical History  Procedure Laterality Date  . Cardiac surgery    . Trigger finger release    . Implantation vagal nerve stimulator    . Vagal nerve stimulator removal     Past Medical History  Diagnosis Date  . High blood pressure   . Diabetes mellitus   . Arthritis   . Plantar fasciitis    BP 132/65  Pulse 84  Resp 14  Ht 5\' 7"  (1.702 m)  Wt 170 lb  (77.111 kg)  BMI 26.62 kg/m2  SpO2 95%     Review of Systems  Respiratory: Positive for apnea.   Psychiatric/Behavioral: Positive for dysphoric mood.  All other systems reviewed and are negative.       Objective:   Physical Exam Very hard of hearing  Neck: Neck supple.  Musculoskeletal: He exhibits tenderness.  Neurological: He is alert and oriented to person, place, and time.  Skin: Skin is warm and dry.  Psychiatric: He has a normal mood and affect.  Symmetric normal motor tone is noted throughout. Normal muscle bulk. Muscle testing reveals 5/5 muscle strength of the upper extremity, and 5/5 of the lower extremity, except peroneus muscles on the right 4-/5. Full range of motion in upper and lower extremities. ROM of spine is not restricted. Fine motor movements are normal in both hands.  Sensory is intact and symmetric to light touch, pinprick and proprioception, except in the distribution of the peroneal nerve on the right.  DTR in the upper and lower extremity are present and symmetric 3+, right achilles tendon reflex 1+, left 2+. No clonus is noted.  Patient arises from chair without difficulty. Wide based  gait with normal arm swing bilateral , able to walk on heels and toes . Tandem walk is very unstable. No pronator drift. Rhomberg negative.  Patient is very hard of hearing.        Assessment & Plan:  1. 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  2. Diabetic peripheral neuropathy affecting both feet and affecting balance. Advised patient and his wife to make sure that the patient eats regularly, also that they should call his endocrinologist to get instructions how to use the insulin in those cases, when his BS drops.  3. Mild LBP after doing to much yard work, which resolves usually over time.  Continue current medications oxycodone 15 mg 3 times a day  Continue gabapentin 800 mg 4 times per day,  Pamelor qhs  Advised the patient to do some foot massage with an  ice bottle or with a massage ball. Advised patient to take a warm foot bath with epson salt in the morning, and move his feet in the water. Advised patient to ask his nephrologist, whether it would be ok for him to use Voltaren gel for his foot pain and his back pain. Recommended walking and exercising in his pool, if somebody is there with him.  Return to clinic one month PA visit

## 2012-11-12 ENCOUNTER — Encounter: Payer: Self-pay | Admitting: Physical Medicine and Rehabilitation

## 2012-11-12 ENCOUNTER — Encounter
Payer: Medicare Other | Attending: Physical Medicine and Rehabilitation | Admitting: Physical Medicine and Rehabilitation

## 2012-11-12 VITALS — BP 100/53 | HR 71 | Resp 14 | Ht 67.0 in | Wt 171.6 lb

## 2012-11-12 DIAGNOSIS — E1142 Type 2 diabetes mellitus with diabetic polyneuropathy: Secondary | ICD-10-CM | POA: Insufficient documentation

## 2012-11-12 DIAGNOSIS — M545 Low back pain, unspecified: Secondary | ICD-10-CM | POA: Insufficient documentation

## 2012-11-12 DIAGNOSIS — IMO0002 Reserved for concepts with insufficient information to code with codable children: Secondary | ICD-10-CM | POA: Insufficient documentation

## 2012-11-12 DIAGNOSIS — E1149 Type 2 diabetes mellitus with other diabetic neurological complication: Secondary | ICD-10-CM

## 2012-11-12 DIAGNOSIS — Z79899 Other long term (current) drug therapy: Secondary | ICD-10-CM | POA: Insufficient documentation

## 2012-11-12 DIAGNOSIS — D168 Benign neoplasm of pelvic bones, sacrum and coccyx: Secondary | ICD-10-CM | POA: Insufficient documentation

## 2012-11-12 MED ORDER — OXYCODONE HCL 15 MG PO TABS: 15.0000 mg | ORAL_TABLET | Freq: Three times a day (TID) | ORAL | Status: DC | PRN

## 2012-11-12 NOTE — Progress Notes (Signed)
Subjective:    Patient ID: Troy [name], male    DOB: [birth year], 67 y.o.   MRN: [id number]  HPI The patient complains about chronic pain in right foot . The patient also complains about numbness and tingling in the right foot.  The problem has been stable, with having good and bad days.  Pain Inventory Average Pain 7 Pain Right Now 7 My pain is stabbing and aching  In the last 24 hours, has pain interfered with the following? General activity 7 Relation with others 7 Enjoyment of life 8 What TIME of day is your pain at its worst? morning, evening Sleep (in general) Fair  Pain is worse with: some activites Pain improves with: rest and medication Relief from Meds: 8  Mobility walk without assistance how many minutes can you walk? 20 ability to climb steps?  yes do you drive?  yes transfers alone Do you have any goals in this area?  yes  Function not employed: date last employed 05-20-2009 Do you have any goals in this area?  yes  Neuro/Psych depression  Prior Studies Any changes since last visit?  no  Physicians involved in your care Any changes since last visit?  no   Family History  Problem Relation Age of Onset  . Hypertension Mother   . Diabetes Mother   . Stroke Mother   . Hypertension Father   . Stroke Father    History   Social History  . Marital Status: Married    Spouse Name: N/A    Number of Children: N/A  . Years of Education: N/A   Social History Main Topics  . Smoking status: Never Smoker   . Smokeless tobacco: Never Used  . Alcohol Use: No  . Drug Use: No  . Sexual Activity: None   Other Topics Concern  . None   Social History Narrative  . None   Past Surgical History  Procedure Laterality Date  . Cardiac surgery    . Trigger finger release    . Implantation vagal nerve stimulator    . Vagal nerve stimulator removal     Past Medical History  Diagnosis Date  . High blood pressure   . Diabetes mellitus   . Arthritis   .  Plantar fasciitis    BP 100/53  Pulse 71  Resp 14  Ht 5\' 7"  (1.702 m)  Wt 171 lb 9.6 oz (77.837 kg)  BMI 26.87 kg/m2  SpO2 96%     Review of Systems  Respiratory: Positive for apnea.   Endocrine:       High blood sugar  Psychiatric/Behavioral: Positive for dysphoric mood.  All other systems reviewed and are negative.       Objective:   Physical Exam Very hard of hearing  Neck: Neck supple.  Musculoskeletal: He exhibits tenderness.  Neurological: He is alert and oriented to person, place, and time.  Skin: Skin is warm and dry.  Psychiatric: He has a normal mood and affect.  Symmetric normal motor tone is noted throughout. Normal muscle bulk. Muscle testing reveals 5/5 muscle strength of the upper extremity, and 5/5 of the lower extremity, except peroneus muscles on the right 4-/5. Full range of motion in upper and lower extremities. ROM of spine is not restricted. Fine motor movements are normal in both hands.  Sensory is intact and symmetric to light touch, pinprick and proprioception, except in the distribution of the peroneal nerve on the right.  DTR in the upper and lower extremity  are present and symmetric 3+, right achilles tendon reflex 1+, left 2+. No clonus is noted.  Patient arises from chair without difficulty. Wide based gait with normal arm swing bilateral , able to walk on heels and toes . Tandem walk is very unstable. No pronator drift. Rhomberg negative.  Patient is very hard of hearing.        Assessment & Plan:  1. 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  2. Diabetic peripheral neuropathy affecting both feet and affecting balance.  3. Mild LBP after doing to much yard work, which resolves usually over time.  Continue current medications oxycodone 15 mg 3 times a day  Continue gabapentin 800 mg 4 times per day,  Pamelor qhs  Advised the patient to do some foot massage with an ice bottle or with a massage ball. Advised patient to take a warm  foot bath with epson salt in the morning, and move his feet in the water. Advised patient to ask his nephrologist, whether it would be ok for him to use Voltaren gel for his foot pain and his back pain. Recommended walking and exercising in his pool, if somebody is there with him.  Return to clinic one month PA visit

## 2012-11-12 NOTE — Patient Instructions (Signed)
Continue with staying as active as tolerated 

## 2012-12-10 ENCOUNTER — Encounter
Payer: Medicare Other | Attending: Physical Medicine and Rehabilitation | Admitting: Physical Medicine and Rehabilitation

## 2012-12-10 ENCOUNTER — Encounter: Payer: Self-pay | Admitting: Physical Medicine and Rehabilitation

## 2012-12-10 VITALS — BP 155/71 | HR 79 | Resp 14 | Ht 66.0 in | Wt 175.0 lb

## 2012-12-10 DIAGNOSIS — IMO0002 Reserved for concepts with insufficient information to code with codable children: Secondary | ICD-10-CM | POA: Insufficient documentation

## 2012-12-10 DIAGNOSIS — Z79899 Other long term (current) drug therapy: Secondary | ICD-10-CM

## 2012-12-10 DIAGNOSIS — M545 Low back pain, unspecified: Secondary | ICD-10-CM | POA: Insufficient documentation

## 2012-12-10 DIAGNOSIS — D219 Benign neoplasm of connective and other soft tissue, unspecified: Secondary | ICD-10-CM | POA: Insufficient documentation

## 2012-12-10 DIAGNOSIS — R279 Unspecified lack of coordination: Secondary | ICD-10-CM | POA: Insufficient documentation

## 2012-12-10 DIAGNOSIS — Z5181 Encounter for therapeutic drug level monitoring: Secondary | ICD-10-CM

## 2012-12-10 DIAGNOSIS — G8929 Other chronic pain: Secondary | ICD-10-CM | POA: Insufficient documentation

## 2012-12-10 DIAGNOSIS — E1142 Type 2 diabetes mellitus with diabetic polyneuropathy: Secondary | ICD-10-CM | POA: Insufficient documentation

## 2012-12-10 DIAGNOSIS — E1149 Type 2 diabetes mellitus with other diabetic neurological complication: Secondary | ICD-10-CM | POA: Insufficient documentation

## 2012-12-10 MED ORDER — OXYCODONE HCL 15 MG PO TABS: 15.0000 mg | ORAL_TABLET | Freq: Three times a day (TID) | ORAL | Status: DC | PRN

## 2012-12-10 NOTE — Progress Notes (Signed)
Subjective:    Patient ID: Troy [name], male    DOB: [birth year], 67 y.o.   MRN: [id number]  HPI The patient complains about chronic pain in right foot . The patient also complains about numbness and tingling in the right foot.  The problem has been stable, with having good and bad days. He is active , he walks , and does feet massage regularly, no new medical issues. Pain Inventory Average Pain 8 Pain Right Now 8 My pain is dull and aching  In the last 24 hours, has pain interfered with the following? General activity 8 Relation with others 7 Enjoyment of life 9 What TIME of day is your pain at its worst? morning, evening Sleep (in general) Fair  Pain is worse with: some activites Pain improves with: rest and medication Relief from Meds: 7  Mobility walk without assistance how many minutes can you walk? 20 ability to climb steps?  yes do you drive?  yes Do you have any goals in this area?  yes  Function retired Do you have any goals in this area?  yes  Neuro/Psych depression  Prior Studies Any changes since last visit?  no  Physicians involved in your care Any changes since last visit?  no   Family History  Problem Relation Age of Onset  . Hypertension Mother   . Diabetes Mother   . Stroke Mother   . Hypertension Father   . Stroke Father    History   Social History  . Marital Status: Married    Spouse Name: N/A    Number of Children: N/A  . Years of Education: N/A   Social History Main Topics  . Smoking status: Never Smoker   . Smokeless tobacco: Never Used  . Alcohol Use: No  . Drug Use: No  . Sexual Activity: None   Other Topics Concern  . None   Social History Narrative  . None   Past Surgical History  Procedure Laterality Date  . Cardiac surgery    . Trigger finger release    . Implantation vagal nerve stimulator    . Vagal nerve stimulator removal     Past Medical History  Diagnosis Date  . High blood pressure   . Diabetes  mellitus   . Arthritis   . Plantar fasciitis    BP 155/71  Pulse 79  Resp 14  Ht 5\' 6"  (1.676 m)  Wt 175 lb (79.379 kg)  BMI 28.26 kg/m2  SpO2 97%     Review of Systems  Endocrine:       High blood sugar  Psychiatric/Behavioral: Positive for dysphoric mood.  All other systems reviewed and are negative.       Objective:   Physical Exam Very hard of hearing  Neck: Neck supple.  Musculoskeletal: He exhibits tenderness.  Neurological: He is alert and oriented to person, place, and time.  Skin: Skin is warm and dry.  Psychiatric: He has a normal mood and affect.  Symmetric normal motor tone is noted throughout. Normal muscle bulk. Muscle testing reveals 5/5 muscle strength of the upper extremity, and 5/5 of the lower extremity, except peroneus muscles on the right 4-/5. Full range of motion in upper and lower extremities. ROM of spine is not restricted. Fine motor movements are normal in both hands.  Sensory is intact and symmetric to light touch, pinprick and proprioception, except in the distribution of the peroneal nerve on the right.  DTR in the upper and lower extremity  are present and symmetric 3+, right achilles tendon reflex 1+, left 2+. No clonus is noted.  Patient arises from chair without difficulty. Wide based gait with normal arm swing bilateral , able to walk on heels and toes . Tandem walk is very unstable. No pronator drift. Rhomberg negative.  Patient is very hard of hearing.        Assessment & Plan:  1. 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  2. Diabetic peripheral neuropathy affecting both feet and affecting balance.  3. Mild LBP after doing to much yard work, which resolves usually over time.  Continue current medications oxycodone 15 mg 3 times a day  Continue gabapentin 800 mg 4 times per day,  Pamelor qhs  Advised the patient to do some foot massage with an ice bottle or with a massage ball. Advised patient to take a warm foot bath with  epson salt in the morning, and move his feet in the water. Advised patient to ask his nephrologist, whether it would be ok for him to use Voltaren gel for his foot pain and his back pain. Recommended  Continuing with his walking. Return to clinic one month PA visit

## 2012-12-10 NOTE — Patient Instructions (Signed)
Continue with staying active and walking regularly

## 2013-01-08 ENCOUNTER — Encounter
Payer: Medicare Other | Attending: Physical Medicine and Rehabilitation | Admitting: Physical Medicine and Rehabilitation

## 2013-01-08 ENCOUNTER — Encounter: Payer: Self-pay | Admitting: Physical Medicine and Rehabilitation

## 2013-01-08 VITALS — BP 137/65 | HR 70 | Resp 14 | Ht 66.0 in | Wt 178.8 lb

## 2013-01-08 DIAGNOSIS — IMO0002 Reserved for concepts with insufficient information to code with codable children: Secondary | ICD-10-CM | POA: Insufficient documentation

## 2013-01-08 DIAGNOSIS — R279 Unspecified lack of coordination: Secondary | ICD-10-CM | POA: Insufficient documentation

## 2013-01-08 DIAGNOSIS — D168 Benign neoplasm of pelvic bones, sacrum and coccyx: Secondary | ICD-10-CM | POA: Insufficient documentation

## 2013-01-08 DIAGNOSIS — G8929 Other chronic pain: Secondary | ICD-10-CM | POA: Insufficient documentation

## 2013-01-08 DIAGNOSIS — E1149 Type 2 diabetes mellitus with other diabetic neurological complication: Secondary | ICD-10-CM | POA: Insufficient documentation

## 2013-01-08 DIAGNOSIS — M545 Low back pain, unspecified: Secondary | ICD-10-CM | POA: Insufficient documentation

## 2013-01-08 DIAGNOSIS — E1142 Type 2 diabetes mellitus with diabetic polyneuropathy: Secondary | ICD-10-CM

## 2013-01-08 MED ORDER — OXYCODONE HCL 15 MG PO TABS: 15.0000 mg | ORAL_TABLET | Freq: Three times a day (TID) | ORAL | Status: DC | PRN

## 2013-01-08 MED ORDER — GABAPENTIN 400 MG PO CAPS: 800.0000 mg | ORAL_CAPSULE | Freq: Four times a day (QID) | ORAL | Status: DC

## 2013-01-08 NOTE — Patient Instructions (Signed)
Stay as active as tolerated. 

## 2013-01-08 NOTE — Progress Notes (Signed)
Subjective:    Patient ID: Troy [name], male    DOB: Aug 12, 1945, 67 y.o.   MRN: [id number]  HPI The patient complains about chronic pain in right foot . The patient also complains about numbness and tingling in the right foot.  The problem has been stable, with having good and bad days.  He is active , he walks , and does feet massage regularly, no new medical issues.  Pain Inventory Average Pain 7 Pain Right Now 8 My pain is dull and aching  In the last 24 hours, has pain interfered with the following? General activity 7 Relation with others 8 Enjoyment of life 8 What TIME of day is your pain at its worst? day, evening Sleep (in general) Fair  Pain is worse with: sitting and some activites Pain improves with: rest and medication Relief from Meds: 8  Mobility walk without assistance how many minutes can you walk? 20 ability to climb steps?  yes do you drive?  yes transfers alone Do you have any goals in this area?  yes  Function not employed: date last employed 05/20/09 retired  Neuro/Psych bladder control problems depression  Prior Studies Any changes since last visit?  no  Physicians involved in your care Any changes since last visit?  no   Family History  Problem Relation Age of Onset  . Hypertension Mother   . Diabetes Mother   . Stroke Mother   . Hypertension Father   . Stroke Father    History   Social History  . Marital Status: Married    Spouse Name: N/A    Number of Children: N/A  . Years of Education: N/A   Social History Main Topics  . Smoking status: Never Smoker   . Smokeless tobacco: Never Used  . Alcohol Use: No  . Drug Use: No  . Sexual Activity: None   Other Topics Concern  . None   Social History Narrative  . None   Past Surgical History  Procedure Laterality Date  . Cardiac surgery    . Trigger finger release    . Implantation vagal nerve stimulator    . Vagal nerve stimulator removal     Past Medical History   Diagnosis Date  . High blood pressure   . Diabetes mellitus   . Arthritis   . Plantar fasciitis    BP 137/65  Pulse 70  Resp 14  Ht 5\' 6"  (1.676 m)  Wt 178 lb 12.8 oz (81.103 kg)  BMI 28.87 kg/m2  SpO2 94%      Review of Systems  Respiratory: Positive for apnea.   Endocrine:       High blood sugar  Genitourinary:       Bladder control problems  Psychiatric/Behavioral: Positive for dysphoric mood.  All other systems reviewed and are negative.       Objective:   Physical Exam Very hard of hearing  Neck: Neck supple.  Musculoskeletal: He exhibits tenderness.  Neurological: He is alert and oriented to person, place, and time.  Skin: Skin is warm and dry.  Psychiatric: He has a normal mood and affect.  Symmetric normal motor tone is noted throughout. Normal muscle bulk. Muscle testing reveals 5/5 muscle strength of the upper extremity, and 5/5 of the lower extremity, except peroneus muscles on the right 4-/5. Full range of motion in upper and lower extremities. ROM of spine is not restricted. Fine motor movements are normal in both hands.  Sensory is intact and symmetric  to light touch, pinprick and proprioception, except in the distribution of the peroneal nerve on the right.  DTR in the upper and lower extremity are present and symmetric 3+, right achilles tendon reflex 1+, left 2+. No clonus is noted.  Patient arises from chair without difficulty. Wide based gait with normal arm swing bilateral , able to walk on heels and toes . Tandem walk is very unstable. No pronator drift. Rhomberg negative.  Patient is very hard of hearing.        Assessment & Plan:  1. 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  2. Diabetic peripheral neuropathy affecting both feet and affecting balance.  3. Mild LBP after doing to much yard work, which resolves usually over time.  Continue current medications oxycodone 15 mg 3 times a day  Continue gabapentin 800 mg 4 times per day,   Pamelor qhs  Advised the patient to do some foot massage with an ice bottle or with a massage ball. Advised patient to take a warm foot bath with epson salt in the morning, and move his feet in the water. Advised patient to ask his nephrologist, whether it would be ok for him to use Voltaren gel for his foot pain and his back pain. Recommended Continuing with his walking.  Return to clinic one month PA visit

## 2013-02-03 ENCOUNTER — Other Ambulatory Visit: Payer: Self-pay | Admitting: *Deleted

## 2013-02-03 MED ORDER — OXYCODONE HCL 15 MG PO TABS: 15.0000 mg | ORAL_TABLET | Freq: Three times a day (TID) | ORAL | Status: DC | PRN

## 2013-02-03 NOTE — Telephone Encounter (Signed)
RX printed early for controlled medication for the visit with RN on 02/07/13 (to be signed by MD) 

## 2013-02-07 ENCOUNTER — Encounter: Payer: Self-pay | Admitting: *Deleted

## 2013-02-07 ENCOUNTER — Encounter: Payer: Medicare Other | Attending: Physical Medicine & Rehabilitation | Admitting: *Deleted

## 2013-02-07 VITALS — BP 155/60 | HR 68 | Resp 14

## 2013-02-07 DIAGNOSIS — E1149 Type 2 diabetes mellitus with other diabetic neurological complication: Secondary | ICD-10-CM | POA: Insufficient documentation

## 2013-02-07 DIAGNOSIS — M545 Low back pain, unspecified: Secondary | ICD-10-CM | POA: Insufficient documentation

## 2013-02-07 DIAGNOSIS — E1142 Type 2 diabetes mellitus with diabetic polyneuropathy: Secondary | ICD-10-CM | POA: Insufficient documentation

## 2013-02-07 DIAGNOSIS — G8929 Other chronic pain: Secondary | ICD-10-CM | POA: Insufficient documentation

## 2013-02-07 DIAGNOSIS — M7750 Other enthesopathy of unspecified foot: Secondary | ICD-10-CM

## 2013-02-07 DIAGNOSIS — M5416 Radiculopathy, lumbar region: Secondary | ICD-10-CM

## 2013-02-07 DIAGNOSIS — IMO0002 Reserved for concepts with insufficient information to code with codable children: Secondary | ICD-10-CM | POA: Insufficient documentation

## 2013-02-07 DIAGNOSIS — D219 Benign neoplasm of connective and other soft tissue, unspecified: Secondary | ICD-10-CM | POA: Insufficient documentation

## 2013-02-07 NOTE — Progress Notes (Signed)
Here for pill count and medication refills. Oxycodone 15 mg 1 tablet q 8hr Fill date 01/20/13 #90  Today NV# 43

## 2013-02-07 NOTE — Patient Instructions (Signed)
Follow up one month with RN for pill count and med refill 

## 2013-03-10 ENCOUNTER — Other Ambulatory Visit: Payer: Self-pay | Admitting: *Deleted

## 2013-03-10 MED ORDER — OXYCODONE HCL 15 MG PO TABS: 15.0000 mg | ORAL_TABLET | Freq: Three times a day (TID) | ORAL | Status: DC | PRN

## 2013-03-10 NOTE — Telephone Encounter (Signed)
RX printed early for controlled medication for the visit with RN on 03/12/13 (to be signed by MD)

## 2013-03-17 ENCOUNTER — Encounter: Payer: Medicare Other | Attending: Physical Medicine & Rehabilitation | Admitting: *Deleted

## 2013-03-17 VITALS — BP 170/59 | HR 72 | Resp 14 | Wt 178.0 lb

## 2013-03-17 DIAGNOSIS — IMO0002 Reserved for concepts with insufficient information to code with codable children: Secondary | ICD-10-CM | POA: Insufficient documentation

## 2013-03-17 DIAGNOSIS — M79609 Pain in unspecified limb: Secondary | ICD-10-CM | POA: Insufficient documentation

## 2013-03-17 DIAGNOSIS — Z79899 Other long term (current) drug therapy: Secondary | ICD-10-CM | POA: Insufficient documentation

## 2013-03-17 DIAGNOSIS — I10 Essential (primary) hypertension: Secondary | ICD-10-CM | POA: Insufficient documentation

## 2013-03-17 DIAGNOSIS — E1142 Type 2 diabetes mellitus with diabetic polyneuropathy: Secondary | ICD-10-CM | POA: Insufficient documentation

## 2013-03-17 DIAGNOSIS — E1149 Type 2 diabetes mellitus with other diabetic neurological complication: Secondary | ICD-10-CM | POA: Insufficient documentation

## 2013-03-17 DIAGNOSIS — M5416 Radiculopathy, lumbar region: Secondary | ICD-10-CM

## 2013-03-17 NOTE — Patient Instructions (Signed)
Follow up one month with Dr Kirsteins  

## 2013-03-17 NOTE — Progress Notes (Signed)
Here for pill count and medication refills. Oxycodone #  67 Fill date 02/21/2013   Today NV# 21  VSS      Opioid risk score is 0.  No changes in in pain or medication.  Follow up in one month with Dr Letta Pate.

## 2013-04-14 ENCOUNTER — Encounter: Payer: Self-pay | Admitting: Physical Medicine & Rehabilitation

## 2013-04-14 ENCOUNTER — Encounter: Payer: Medicare Other | Attending: Physical Medicine & Rehabilitation

## 2013-04-14 ENCOUNTER — Ambulatory Visit (HOSPITAL_BASED_OUTPATIENT_CLINIC_OR_DEPARTMENT_OTHER): Payer: Medicare Other | Admitting: Physical Medicine & Rehabilitation

## 2013-04-14 VITALS — BP 164/82 | HR 80 | Resp 14 | Ht 67.0 in | Wt 170.0 lb

## 2013-04-14 DIAGNOSIS — E1149 Type 2 diabetes mellitus with other diabetic neurological complication: Secondary | ICD-10-CM | POA: Insufficient documentation

## 2013-04-14 DIAGNOSIS — M5416 Radiculopathy, lumbar region: Secondary | ICD-10-CM

## 2013-04-14 DIAGNOSIS — IMO0002 Reserved for concepts with insufficient information to code with codable children: Secondary | ICD-10-CM | POA: Insufficient documentation

## 2013-04-14 DIAGNOSIS — E1142 Type 2 diabetes mellitus with diabetic polyneuropathy: Secondary | ICD-10-CM | POA: Insufficient documentation

## 2013-04-14 DIAGNOSIS — D219 Benign neoplasm of connective and other soft tissue, unspecified: Secondary | ICD-10-CM | POA: Insufficient documentation

## 2013-04-14 DIAGNOSIS — M545 Low back pain, unspecified: Secondary | ICD-10-CM | POA: Insufficient documentation

## 2013-04-14 DIAGNOSIS — E1140 Type 2 diabetes mellitus with diabetic neuropathy, unspecified: Secondary | ICD-10-CM

## 2013-04-14 DIAGNOSIS — G8929 Other chronic pain: Secondary | ICD-10-CM | POA: Insufficient documentation

## 2013-04-14 MED ORDER — OXYCODONE HCL 15 MG PO TABS: 15.0000 mg | ORAL_TABLET | Freq: Three times a day (TID) | ORAL | Status: DC | PRN

## 2013-04-14 NOTE — Patient Instructions (Signed)
Cont current medications will see new PA Zella Ball next month.

## 2013-04-14 NOTE — Progress Notes (Signed)
   Subjective:    Patient ID: Troy [name], male    DOB: 1945-03-16, 68 y.o.   MRN: [id number]  HPI Pain medications still effective for the right foot pain. No falls in the last couple years. No new medical issues Pain Inventory Average Pain 8 Pain Right Now 8 My pain is aching  In the last 24 hours, has pain interfered with the following? General activity 7 Relation with others 7 Enjoyment of life 7 What TIME of day is your pain at its worst? daytime Sleep (in general) Fair  Pain is worse with: some activites Pain improves with: medication Relief from Meds: 7  Mobility walk without assistance how many minutes can you walk? 20 ability to climb steps?  yes do you drive?  yes transfers alone  Function not employed: date last employed 05/20/2009 retired  Neuro/Psych depression  Prior Studies Any changes since last visit?  no  Physicians involved in your care Any changes since last visit?  no   Family History  Problem Relation Age of Onset  . Hypertension Mother   . Diabetes Mother   . Stroke Mother   . Hypertension Father   . Stroke Father    History   Social History  . Marital Status: Married    Spouse Name: N/A    Number of Children: N/A  . Years of Education: N/A   Social History Main Topics  . Smoking status: Never Smoker   . Smokeless tobacco: Never Used  . Alcohol Use: No  . Drug Use: No  . Sexual Activity: None   Other Topics Concern  . None   Social History Narrative  . None   Past Surgical History  Procedure Laterality Date  . Cardiac surgery    . Trigger finger release    . Implantation vagal nerve stimulator    . Vagal nerve stimulator removal     Past Medical History  Diagnosis Date  . High blood pressure   . Diabetes mellitus   . Arthritis   . Plantar fasciitis    BP 164/82  Pulse 80  Resp 14  Ht 5\' 7"  (1.702 m)  Wt 170 lb (77.111 kg)  BMI 26.62 kg/m2  SpO2 99%  Opioid Risk Score:   Fall Risk Score: Moderate  Fall Risk (6-13 points) (pt educated on fall risk, declined brochure)    Review of Systems  Respiratory: Positive for apnea.   Endocrine:       High blood sugar   Psychiatric/Behavioral: Positive for dysphoric mood.  All other systems reviewed and are negative.       Objective:   Physical Exam  Sensory intact to light touch in bilateral feet. Motor strength 5/5 bilateral ankle dorsiflexor plantar flexor Skin shows no evidence of breakdown Feet are warm to touch normal color Pedal and posterior tibial pulses are normal Negative straight leg raise     Assessment & Plan:  1. 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  2. Diabetic peripheral neuropathy affecting both feet and affecting balance.  Continue current medications oxycodone 15 mg 3 times a day  Continue gabapentin 400 mg 4 times per day  Pamelor qhs  Return to clinic one month PA visit

## 2013-05-15 ENCOUNTER — Encounter: Payer: Self-pay | Admitting: Physical Medicine and Rehabilitation

## 2013-05-15 ENCOUNTER — Encounter: Payer: Medicare Other | Attending: Physical Medicine & Rehabilitation | Admitting: Physical Medicine and Rehabilitation

## 2013-05-15 VITALS — BP 143/60 | HR 70 | Resp 14 | Ht 67.0 in | Wt 173.0 lb

## 2013-05-15 DIAGNOSIS — D219 Benign neoplasm of connective and other soft tissue, unspecified: Secondary | ICD-10-CM | POA: Insufficient documentation

## 2013-05-15 DIAGNOSIS — E1149 Type 2 diabetes mellitus with other diabetic neurological complication: Secondary | ICD-10-CM

## 2013-05-15 DIAGNOSIS — G8929 Other chronic pain: Secondary | ICD-10-CM | POA: Insufficient documentation

## 2013-05-15 DIAGNOSIS — M545 Low back pain, unspecified: Secondary | ICD-10-CM | POA: Insufficient documentation

## 2013-05-15 DIAGNOSIS — IMO0002 Reserved for concepts with insufficient information to code with codable children: Secondary | ICD-10-CM

## 2013-05-15 DIAGNOSIS — E1142 Type 2 diabetes mellitus with diabetic polyneuropathy: Secondary | ICD-10-CM

## 2013-05-15 DIAGNOSIS — M5416 Radiculopathy, lumbar region: Secondary | ICD-10-CM

## 2013-05-15 DIAGNOSIS — Z79899 Other long term (current) drug therapy: Secondary | ICD-10-CM

## 2013-05-15 MED ORDER — OXYCODONE HCL 15 MG PO TABS: 15.0000 mg | ORAL_TABLET | Freq: Three times a day (TID) | ORAL | Status: DC | PRN

## 2013-05-15 NOTE — Progress Notes (Signed)
Subjective:    Patient ID: Troy [name], male    DOB: 21-Oct-1945, 68 y.o.   MRN: [id number]  HPI [name] is a 68 year old male with history of DM type 2 with poorly controlled BS as well chronic right foot pain due to schwannoma resection from lumbar spine. He had spinal cord stimulator with removal due to ineffectiveness. He rates his pain as 7/10 on average and gets 7/10 relief from pain medications. He tries to stay active and his exercise involves working in the yard.    Pain Inventory Average Pain 7 Pain Right Now 7 My pain is dull  In the last 24 hours, has pain interfered with the following? General activity 7 Relation with others 8 Enjoyment of life 7 What TIME of day is your pain at its worst? daytime Sleep (in general) Fair  Pain is worse with: some activites Pain improves with: medication Relief from Meds: 7  Mobility walk without assistance how many minutes can you walk? 20 do you drive?  yes transfers alone Do you have any goals in this area?  yes  Function not employed: date last employed 05/20/2009 retired Do you have any goals in this area?  yes  Neuro/Psych depression  Prior Studies Any changes since last visit?  no  Physicians involved in your care Any changes since last visit?  no   Family History  Problem Relation Age of Onset  . Hypertension Mother   . Diabetes Mother   . Stroke Mother   . Hypertension Father   . Stroke Father    History   Social History  . Marital Status: Married    Spouse Name: N/A    Number of Children: N/A  . Years of Education: N/A   Social History Main Topics  . Smoking status: Never Smoker   . Smokeless tobacco: Never Used  . Alcohol Use: No  . Drug Use: No  . Sexual Activity: None   Other Topics Concern  . None   Social History Narrative  . None   Past Surgical History  Procedure Laterality Date  . Cardiac surgery    . Trigger finger release    . Implantation vagal nerve stimulator      . Vagal nerve stimulator removal     Past Medical History  Diagnosis Date  . High blood pressure   . Diabetes mellitus   . Arthritis   . Plantar fasciitis    BP 143/60  Pulse 70  Resp 14  Ht 5\' 7"  (1.702 m)  Wt 173 lb (78.472 kg)  BMI 27.09 kg/m2  SpO2 97%  Opioid Risk Score:   Fall Risk Score: Moderate Fall Risk (6-13 points) (pt educated and given brochure on fall risk previously)    Review of Systems  HENT: Positive for hearing loss.   Respiratory: Negative for shortness of breath.   Cardiovascular: Negative for chest pain.  Gastrointestinal: Positive for constipation.  Musculoskeletal: Positive for arthralgias.  Psychiatric/Behavioral:       Depression  All other systems reviewed and are negative.       Objective:   Physical Exam  Nursing note and vitals reviewed. Constitutional: He is oriented to person, place, and time. He appears well-developed and well-nourished.  HENT:  Head: Normocephalic and atraumatic.  Eyes: Conjunctivae are normal. Pupils are equal, round, and reactive to light.  Neck: Normal range of motion. Neck supple.  Cardiovascular: Normal rate and regular rhythm.   Pulmonary/Chest: Effort normal and breath  sounds normal.  Abdominal: Soft. Bowel sounds are normal.  Musculoskeletal: He exhibits no edema.  Patient with normal motor tone and normal muscle bulk. Muscle testing reveals 5/5 muscle strength of the upper extremity, and 5/5 of the lower extremity, except peroneus muscles on the right 4-/5. No clonus is noted.   Patient arises from chair without difficulty and walks with narrow based gait. No assistive device.   Neurological: He is alert and oriented to person, place, and time.  Skin: Skin is warm and dry.          Assessment & Plan:  1. 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   2. Diabetic peripheral neuropathy affecting  both feet:  He has been referred to endocrinologist to help improve BS management. We discussed importance of BS control on progression of neuropathy. We discussed diet adjustment for BS stabilization as well as constipation issues (increase fiber). Also encouraged increasing exercise at least 10 minutes couple times a day to avoid exacerbating symptoms.

## 2013-05-15 NOTE — Patient Instructions (Signed)
Zostrix cream --use on right foot three times a day.

## 2013-06-17 ENCOUNTER — Encounter: Payer: Medicare Other | Attending: Physical Medicine & Rehabilitation | Admitting: Registered Nurse

## 2013-06-17 ENCOUNTER — Encounter: Payer: Self-pay | Admitting: Gastroenterology

## 2013-06-17 ENCOUNTER — Encounter: Payer: Self-pay | Admitting: Registered Nurse

## 2013-06-17 ENCOUNTER — Encounter: Payer: Medicare Other | Admitting: Registered Nurse

## 2013-06-17 VITALS — BP 141/60 | HR 68 | Resp 14 | Ht 67.0 in | Wt 171.0 lb

## 2013-06-17 DIAGNOSIS — Z5181 Encounter for therapeutic drug level monitoring: Secondary | ICD-10-CM

## 2013-06-17 DIAGNOSIS — D219 Benign neoplasm of connective and other soft tissue, unspecified: Secondary | ICD-10-CM | POA: Insufficient documentation

## 2013-06-17 DIAGNOSIS — Z79899 Other long term (current) drug therapy: Secondary | ICD-10-CM

## 2013-06-17 DIAGNOSIS — M545 Low back pain, unspecified: Secondary | ICD-10-CM | POA: Insufficient documentation

## 2013-06-17 DIAGNOSIS — E1142 Type 2 diabetes mellitus with diabetic polyneuropathy: Secondary | ICD-10-CM

## 2013-06-17 DIAGNOSIS — M5416 Radiculopathy, lumbar region: Secondary | ICD-10-CM

## 2013-06-17 DIAGNOSIS — IMO0002 Reserved for concepts with insufficient information to code with codable children: Secondary | ICD-10-CM

## 2013-06-17 DIAGNOSIS — E1149 Type 2 diabetes mellitus with other diabetic neurological complication: Secondary | ICD-10-CM | POA: Insufficient documentation

## 2013-06-17 DIAGNOSIS — G8929 Other chronic pain: Secondary | ICD-10-CM | POA: Insufficient documentation

## 2013-06-17 MED ORDER — OXYCODONE HCL 15 MG PO TABS: 15.0000 mg | ORAL_TABLET | Freq: Three times a day (TID) | ORAL | Status: DC | PRN

## 2013-06-17 NOTE — Progress Notes (Signed)
Subjective:    Patient ID: Troy [name], male    DOB: 12-13-1945, 68 y.o.   MRN: [id number]  HPI: Troy [name] is a 68 year old male who returns for follow up for chronic pain and medication refill. Wife in room. He says his pain is on the sole of his right foot  and laterally.  He rates his pain 8. His current exercise regime is performing yard work.  His wife says his blood sugars have been better controlled, since going to a new endocrinologist Dr. Lennie Odor. He was diagnose a few years ago with a fatty liver has been lost to follow up wife made an appointment with [place] appointment scheduled forJune/2015.   Pain Inventory Average Pain 7 Pain Right Now 8 My pain is burning and aching  In the last 24 hours, has pain interfered with the following? General activity 7 Relation with others 7 Enjoyment of life 8 What TIME of day is your pain at its worst? morning, daytime Sleep (in general) Good  Pain is worse with: sitting Pain improves with: rest and medication Relief from Meds: 8  Mobility walk without assistance how many minutes can you walk? 20 ability to climb steps?  yes do you drive?  yes transfers alone Do you have any goals in this area?  no  Function not employed: date last employed 05/20/2009 retired Do you have any goals in this area?  yes  Neuro/Psych bladder control problems bowel control problems  Prior Studies Any changes since last visit?  no  Physicians involved in your care Any changes since last visit?  no   Family History  Problem Relation Age of Onset  . Hypertension Mother   . Diabetes Mother   . Stroke Mother   . Hypertension Father   . Stroke Father    History   Social History  . Marital Status: Married    Spouse Name: N/A    Number of Children: N/A  . Years of Education: N/A   Social History Main Topics  . Smoking status: Never Smoker   . Smokeless tobacco: Never Used  . Alcohol Use: No  . Drug Use: No  . Sexual  Activity: None   Other Topics Concern  . None   Social History Narrative  . None   Past Surgical History  Procedure Laterality Date  . Cardiac surgery    . Trigger finger release    . Implantation vagal nerve stimulator    . Vagal nerve stimulator removal    . Tumor removal  2002    S1 tumor removal by Dr. Annette Stable   Past Medical History  Diagnosis Date  . High blood pressure   . Diabetes mellitus   . Arthritis   . Plantar fasciitis    BP 141/60  Pulse 68  Resp 14  Ht 5\' 7"  (1.702 m)  Wt 171 lb (77.565 kg)  BMI 26.78 kg/m2  SpO2 99%  Opioid Risk Score:   Fall Risk Score: Moderate Fall Risk (6-13 points) (pt educated and given a brochure on fall risk previously)   Review of Systems  Respiratory: Positive for apnea.   Endocrine:       High blood sugar  Genitourinary:       Bowel and bladder control problems  All other systems reviewed and are negative.      Objective:   Physical Exam  Nursing note and vitals reviewed. Constitutional: He is oriented to person, place, and time. He appears  well-developed and well-nourished.  HENT:  Head: Normocephalic and atraumatic.  Neck: Normal range of motion. Neck supple.  Cardiovascular: Normal rate, regular rhythm and normal heart sounds.   Pulmonary/Chest: Effort normal and breath sounds normal.  Musculoskeletal:  Normal Muscle Bulk: Muscle testing reveals: Upper and Lower Extremities with 5/5 muscle strength. Back without Spinal or paraspinal tenderness, Narrow Based Gait. Arises from chair without difficulty.   Neurological: He is alert and oriented to person, place, and time.  Skin: Skin is warm and dry.  Psychiatric: He has a normal mood and affect.          Assessment & Plan:  1. 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  2. Diabetic peripheral neuropathy affecting both feet: He has a new endocrinologist and his BS have  improved.  20 minutes of face to face patient care timewas spent during this visit. All questions were encouraged and answered.  F/U in 1 month

## 2013-07-17 ENCOUNTER — Encounter: Payer: Medicare Other | Attending: Physical Medicine & Rehabilitation | Admitting: Registered Nurse

## 2013-07-17 ENCOUNTER — Encounter: Payer: Self-pay | Admitting: Registered Nurse

## 2013-07-17 VITALS — BP 110/60 | HR 68 | Resp 14 | Wt 171.0 lb

## 2013-07-17 DIAGNOSIS — Z5181 Encounter for therapeutic drug level monitoring: Secondary | ICD-10-CM

## 2013-07-17 DIAGNOSIS — D219 Benign neoplasm of connective and other soft tissue, unspecified: Secondary | ICD-10-CM | POA: Insufficient documentation

## 2013-07-17 DIAGNOSIS — E1142 Type 2 diabetes mellitus with diabetic polyneuropathy: Secondary | ICD-10-CM

## 2013-07-17 DIAGNOSIS — Z79899 Other long term (current) drug therapy: Secondary | ICD-10-CM

## 2013-07-17 DIAGNOSIS — IMO0002 Reserved for concepts with insufficient information to code with codable children: Secondary | ICD-10-CM | POA: Insufficient documentation

## 2013-07-17 DIAGNOSIS — E1149 Type 2 diabetes mellitus with other diabetic neurological complication: Secondary | ICD-10-CM

## 2013-07-17 DIAGNOSIS — M545 Low back pain, unspecified: Secondary | ICD-10-CM | POA: Insufficient documentation

## 2013-07-17 DIAGNOSIS — M5416 Radiculopathy, lumbar region: Secondary | ICD-10-CM

## 2013-07-17 DIAGNOSIS — G8929 Other chronic pain: Secondary | ICD-10-CM | POA: Insufficient documentation

## 2013-07-17 MED ORDER — OXYCODONE HCL 15 MG PO TABS: 15.0000 mg | ORAL_TABLET | Freq: Three times a day (TID) | ORAL | Status: DC | PRN

## 2013-07-17 NOTE — Progress Notes (Signed)
Subjective:    Patient ID: Troy [name], male    DOB: 08-16-1945, 68 y.o.   MRN: [id number]  HPI: [name] is a 68 year old male who returns for follow up for chronic pain and medication refill. He says his pain is located in his right foot. He rates his pain 7. His current exercise regime is walking, yard work and painting.   Pain Inventory Average Pain 8 Pain Right Now 7 My pain is burning and aching  In the last 24 hours, has pain interfered with the following? General activity 7 Relation with others 7 Enjoyment of life 8 What TIME of day is your pain at its worst? daytime Sleep (in general) Fair  Pain is worse with: some activites Pain improves with: medication Relief from Meds: 8  Mobility how many minutes can you walk? 20 ability to climb steps?  yes do you drive?  yes  Function retired  Neuro/Psych bladder control problems bowel control problems depression  Prior Studies Any changes since last visit?  no  Physicians involved in your care Any changes since last visit?  no   Family History  Problem Relation Age of Onset  . Hypertension Mother   . Diabetes Mother   . Stroke Mother   . Hypertension Father   . Stroke Father    History   Social History  . Marital Status: Married    Spouse Name: N/A    Number of Children: N/A  . Years of Education: N/A   Social History Main Topics  . Smoking status: Never Smoker   . Smokeless tobacco: Never Used  . Alcohol Use: No  . Drug Use: No  . Sexual Activity: None   Other Topics Concern  . None   Social History Narrative  . None   Past Surgical History  Procedure Laterality Date  . Cardiac surgery    . Trigger finger release    . Implantation vagal nerve stimulator    . Vagal nerve stimulator removal    . Tumor removal  2002    S1 tumor removal by Dr. Annette Stable   Past Medical History  Diagnosis Date  . High blood pressure   . Diabetes mellitus   . Arthritis   . Plantar fasciitis     BP 110/60  Pulse 68  Resp 14  Wt 171 lb (77.565 kg)  SpO2 95%  Opioid Risk Score:   Fall Risk Score: Moderate Fall Risk (6-13 points) (educated and handout given previously for fall prevention in the home) Review of Systems  Respiratory: Positive for apnea.   Gastrointestinal:       Bowel Control Problems  Endocrine:       High blood sugar  Genitourinary:       Bladder control problems  Psychiatric/Behavioral: Positive for dysphoric mood.  All other systems reviewed and are negative.      Objective:   Physical Exam  Nursing note and vitals reviewed. Constitutional: He is oriented to person, place, and time. He appears well-developed and well-nourished.  HENT:  Head: Normocephalic and atraumatic.  Neck: Normal range of motion. Neck supple.  Cardiovascular: Normal rate, regular rhythm and normal heart sounds.   Pulmonary/Chest: Effort normal and breath sounds normal.  Musculoskeletal:  Normal Muscle Bulk and Muscle testing Reveals: Upper and Lower extremities: Full ROM and Muscle Strength 5/5. Arises from chair with ease Narrow Based gait  Neurological: He is alert and oriented to person, place, and time.  Skin: Skin  is warm and dry.  Psychiatric: He has a normal mood and affect.          Assessment & Plan:  1. 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  2. Diabetic peripheral neuropathy affecting both feet: He has a new endocrinologist and BS has improved. His wife staes his HGB A1c down to 8.1- 11.1.   20 minutes of face to face patient care time was spent during this visit. All questions were encouraged and answered.  F/U in 1 month

## 2013-08-12 ENCOUNTER — Encounter: Payer: Self-pay | Admitting: Registered Nurse

## 2013-08-12 ENCOUNTER — Encounter: Payer: Self-pay | Admitting: Gastroenterology

## 2013-08-12 ENCOUNTER — Encounter: Payer: Medicare Other | Attending: Physical Medicine & Rehabilitation | Admitting: Registered Nurse

## 2013-08-12 ENCOUNTER — Ambulatory Visit (INDEPENDENT_AMBULATORY_CARE_PROVIDER_SITE_OTHER): Payer: Medicare Other | Admitting: Gastroenterology

## 2013-08-12 VITALS — BP 145/72 | HR 74 | Resp 14 | Ht 67.0 in | Wt 167.0 lb

## 2013-08-12 VITALS — BP 138/80 | HR 74 | Ht 67.0 in | Wt 167.2 lb

## 2013-08-12 DIAGNOSIS — Z1211 Encounter for screening for malignant neoplasm of colon: Secondary | ICD-10-CM | POA: Insufficient documentation

## 2013-08-12 DIAGNOSIS — Z79899 Other long term (current) drug therapy: Secondary | ICD-10-CM

## 2013-08-12 DIAGNOSIS — M545 Low back pain, unspecified: Secondary | ICD-10-CM | POA: Insufficient documentation

## 2013-08-12 DIAGNOSIS — IMO0002 Reserved for concepts with insufficient information to code with codable children: Secondary | ICD-10-CM | POA: Insufficient documentation

## 2013-08-12 DIAGNOSIS — E1142 Type 2 diabetes mellitus with diabetic polyneuropathy: Secondary | ICD-10-CM

## 2013-08-12 DIAGNOSIS — D219 Benign neoplasm of connective and other soft tissue, unspecified: Secondary | ICD-10-CM | POA: Insufficient documentation

## 2013-08-12 DIAGNOSIS — K7689 Other specified diseases of liver: Secondary | ICD-10-CM

## 2013-08-12 DIAGNOSIS — E1149 Type 2 diabetes mellitus with other diabetic neurological complication: Secondary | ICD-10-CM

## 2013-08-12 DIAGNOSIS — M5416 Radiculopathy, lumbar region: Secondary | ICD-10-CM

## 2013-08-12 DIAGNOSIS — G8929 Other chronic pain: Secondary | ICD-10-CM | POA: Insufficient documentation

## 2013-08-12 DIAGNOSIS — K760 Fatty (change of) liver, not elsewhere classified: Secondary | ICD-10-CM | POA: Insufficient documentation

## 2013-08-12 DIAGNOSIS — Z5181 Encounter for therapeutic drug level monitoring: Secondary | ICD-10-CM

## 2013-08-12 MED ORDER — OXYCODONE HCL 15 MG PO TABS: 15.0000 mg | ORAL_TABLET | Freq: Three times a day (TID) | ORAL | Status: DC | PRN

## 2013-08-12 NOTE — Assessment & Plan Note (Signed)
Last colonoscopy greater than 10 years ago.  Plan repeat colonoscopy once liver issues are resolved.

## 2013-08-12 NOTE — Assessment & Plan Note (Signed)
Clearly by ultrasound patient has hepatic steatosis.  Reports of worsening liver enzymes raise the question of perhaps another process affecting the liver.  His risk factor for Troy [name] is diabetes.  By exam there is some hepatic enlargement.  Recommendations #1 obtained prior records #2 LFTs, comprehensive metabolic profile and INR

## 2013-08-12 NOTE — Patient Instructions (Signed)
Please use [facility] daily as instructed by Dr. Deatra Ina.    Dr. Deatra Ina will contact you after he has received your records and labs from [facility]

## 2013-08-12 NOTE — Progress Notes (Signed)
_                                                                                                                History of Present Illness: This 68 year old white male with history of diabetes referred for evaluation of fatty liver.  This apparently was noted incidentally by ultrasound and abnormal liver tests.  By report an ultrasound in March, 2015 demonstrated severe hepatic steatosis and borderline splenomegaly.  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  Patient does not drink.  He has insulin dependent diabetes.  Weight has been stable.  He denies abdominal pain.  Last colonoscopy was over 10 years ago.  He has no other GI complaints.    Past Medical History  Diagnosis Date  . High blood pressure   . Diabetes mellitus   . Arthritis   . Plantar fasciitis    Past Surgical History  Procedure Laterality Date  . Cardiac surgery  2003    stent  . Trigger finger release    . Implantation vagal nerve stimulator    . Vagal nerve stimulator removal    . Tumor removal  2002    S1 tumor removal by Dr. Annette Stable   family history includes Diabetes in his mother; Hypertension in his father and mother; Stroke in his daughter, father, mother, and sister. Current Outpatient Prescriptions  Medication Sig Dispense Refill  . allopurinol (ZYLOPRIM) 100 MG tablet       . amLODipine (NORVASC) 10 MG tablet Take 10 mg by mouth daily.        . ANDROGEL 40.5 MG/2.5GM (1.62%) GEL       . Ascorbic Acid (VITAMIN C) 500 MG tablet Take 500 mg by mouth daily.        Marland Kitchen aspirin (ASPIRIN LOW DOSE) 81 MG EC tablet Take 81 mg by mouth daily.        . Calcium-Vitamin D 600-125 MG-UNIT TABS Take 1 tablet by mouth daily.        . carvedilol (COREG) 6.25 MG tablet Take 2 by mouth once daily       . Cholecalciferol (VITAMIN D3) 3000 UNITS TABS Take 1 tablet by mouth daily.        Marland Kitchen docusate sodium (STOOL SOFTENER) 100 MG capsule Take 100 mg by mouth 2 (two) times daily.        . fenofibrate 160 MG  tablet       . finasteride (PROSCAR) 5 MG tablet Take 5 mg by mouth at bedtime.        . gabapentin (NEURONTIN) 400 MG capsule Take 2 capsules (800 mg total) by mouth 4 (four) times daily. Three month supply  720 capsule  2  . LANTUS SOLOSTAR 100 UNIT/ML SOPN       . losartan-hydrochlorothiazide (HYZAAR) 100-12.5 MG per tablet       . mirtazapine (REMERON) 45 MG tablet       . MULTIPLE VITAMIN PO Take 1 tablet by mouth daily.        Marland Kitchen  nortriptyline (PAMELOR) 10 MG capsule Take 10 mg by mouth at bedtime.        Marland Kitchen NOVOLOG FLEXPEN 100 UNIT/ML FlexPen       . NOVOLOG MIX 70/30 FLEXPEN (70-30) 100 UNIT/ML SUPN       . oxyCODONE (ROXICODONE) 15 MG immediate release tablet Take 1 tablet (15 mg total) by mouth every 8 (eight) hours as needed for pain.  90 tablet  0  . pravastatin (PRAVACHOL) 40 MG tablet Take 40 mg by mouth at bedtime.        Marland Kitchen QUEtiapine (SEROQUEL) 100 MG tablet       . Tamsulosin HCl (FLOMAX) 0.4 MG CAPS Take 2 by mouth once daily       . Venlafaxine HCl 150 MG TB24        No current facility-administered medications for this visit.   Allergies as of 08/12/2013 - Review Complete 08/12/2013  Allergen Reaction Noted  . Heparin Other (See Comments) 03/14/2010    reports that he has never smoked. He has never used smokeless tobacco. He reports that he does not drink alcohol or use illicit drugs.     Review of Systems: He is hard of hearing.  He has right foot weakness related to a peripheral neuropathy previous spine surgery. Pertinent positive and negative review of systems were noted in the above HPI section. All other review of systems were otherwise negative.  Vital signs were reviewed in today's medical record Physical Exam: General: Well developed , well nourished, no acute distress Skin: anicteric Head: Normocephalic and atraumatic Eyes:  sclerae anicteric, EOMI Ears: Normal auditory acuity Mouth: No deformity or lesions Neck: Supple, no masses or  thyromegaly Lungs: Clear throughout to auscultation Heart: Regular rate and rhythm; no murmurs, rubs or bruits Abdomen: Soft, non tender and non distended. No masses, or hernias noted. Normal Bowel sounds.  Liver tip is palpable 2 fingerbreadths low the right costal margin.  Hepar percusses to 13 cm Rectal:deferred Musculoskeletal: Symmetrical with no gross deformities  Skin: No lesions on visible extremities Pulses:  Normal pulses noted Extremities: No clubbing, cyanosis, edema or deformities noted Neurological: Alert oriented x 4, grossly nonfocal Cervical Nodes:  No significant cervical adenopathy Inguinal Nodes: No significant inguinal adenopathy Psychological:  Alert and cooperative. Normal mood and affect  See Assessment and Plan under Problem List

## 2013-08-12 NOTE — Progress Notes (Signed)
Subjective:    Patient ID: Troy [name], male    DOB: 04/28/1945, 68 y.o.   MRN: [id number]  HPI: [name] is a 68 year old male who returns for follow up for chronic pain and medication refill.He says his pain is located in his right foot. He rates his pain 7. His current exercise regime is walking and working around the house and yard work. Pain Inventory Average Pain 7 Pain Right Now 7 My pain is intermittent and aching  In the last 24 hours, has pain interfered with the following? General activity 7 Relation with others 7 Enjoyment of life 8 What TIME of day is your pain at its worst? evening Sleep (in general) Good  Pain is worse with: bending Pain improves with: medication Relief from Meds: 8  Mobility walk without assistance how many minutes can you walk? 20 ability to climb steps?  yes do you drive?  yes transfers alone Do you have any goals in this area?  yes  Function not employed: date last employed 05/20/2009 retired  Neuro/Psych bladder control problems bowel control problems depression  Prior Studies Any changes since last visit?  no  Physicians involved in your care Any changes since last visit?  no   Family History  Problem Relation Age of Onset  . Hypertension Mother   . Diabetes Mother   . Stroke Mother   . Hypertension Father   . Stroke Father    History   Social History  . Marital Status: Married    Spouse Name: N/A    Number of Children: N/A  . Years of Education: N/A   Social History Main Topics  . Smoking status: Never Smoker   . Smokeless tobacco: Never Used  . Alcohol Use: No  . Drug Use: No  . Sexual Activity: None   Other Topics Concern  . None   Social History Narrative  . None   Past Surgical History  Procedure Laterality Date  . Cardiac surgery    . Trigger finger release    . Implantation vagal nerve stimulator    . Vagal nerve stimulator removal    . Tumor removal  2002    S1 tumor removal by  Dr. Annette Stable   Past Medical History  Diagnosis Date  . High blood pressure   . Diabetes mellitus   . Arthritis   . Plantar fasciitis    BP 145/72  Pulse 74  Resp 14  Ht 5\' 7"  (1.702 m)  Wt 167 lb (75.751 kg)  BMI 26.15 kg/m2  SpO2 96%  Opioid Risk Score:   Fall Risk Score: Moderate Fall Risk (6-13 points) (pt educated on fall risk, brochure given to pt previously)    Review of Systems  Respiratory: Positive for apnea.   Endocrine:       High blood sugar  Genitourinary:       Bowel and bladder control problems  Psychiatric/Behavioral:       Depression  All other systems reviewed and are negative.      Objective:   Physical Exam  Nursing note and vitals reviewed. Constitutional: He is oriented to person, place, and time. He appears well-developed and well-nourished.  HENT:  Head: Normocephalic and atraumatic.  Neck: Normal range of motion. Neck supple.  Cardiovascular: Normal rate, regular rhythm and normal heart sounds.   Pulmonary/Chest: Effort normal and breath sounds normal.  Musculoskeletal:  Normal Muscle Bulk and Muscle Testing Reveals: Upper Extremities: Full ROM and Muscle  Strength 5/5 Back without spinal or paraspinal tenderness Lower Extremities: Full ROM and Muscle Strength 5/5 Arises from chair with ease Narrow Based Gait  Neurological: He is alert and oriented to person, place, and time.  Skin: Skin is warm and dry.  Psychiatric: He has a normal mood and affect.          Assessment & Plan:  1. 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  2. Diabetic peripheral neuropathy affecting both feet: He has a new endocrinologist and BS has improved. His wife states his HGB A1c down to 8.1- 11.1. His next HGB A1c scheduled for August 2015  20 minutes of face to face patient care time was spent during this visit. All questions were encouraged and answered.   F/U in 1 month

## 2013-09-10 ENCOUNTER — Encounter: Payer: Medicare Other | Attending: Physical Medicine & Rehabilitation | Admitting: Registered Nurse

## 2013-09-10 ENCOUNTER — Encounter: Payer: Self-pay | Admitting: Registered Nurse

## 2013-09-10 VITALS — BP 146/67 | HR 67 | Resp 16 | Ht 67.0 in | Wt 168.0 lb

## 2013-09-10 DIAGNOSIS — Z5181 Encounter for therapeutic drug level monitoring: Secondary | ICD-10-CM

## 2013-09-10 DIAGNOSIS — IMO0002 Reserved for concepts with insufficient information to code with codable children: Secondary | ICD-10-CM | POA: Diagnosis not present

## 2013-09-10 DIAGNOSIS — D219 Benign neoplasm of connective and other soft tissue, unspecified: Secondary | ICD-10-CM | POA: Insufficient documentation

## 2013-09-10 DIAGNOSIS — Z79899 Other long term (current) drug therapy: Secondary | ICD-10-CM

## 2013-09-10 DIAGNOSIS — E1149 Type 2 diabetes mellitus with other diabetic neurological complication: Secondary | ICD-10-CM | POA: Diagnosis not present

## 2013-09-10 DIAGNOSIS — G8929 Other chronic pain: Secondary | ICD-10-CM | POA: Diagnosis not present

## 2013-09-10 DIAGNOSIS — M545 Low back pain, unspecified: Secondary | ICD-10-CM | POA: Diagnosis not present

## 2013-09-10 DIAGNOSIS — E1142 Type 2 diabetes mellitus with diabetic polyneuropathy: Secondary | ICD-10-CM | POA: Diagnosis not present

## 2013-09-10 DIAGNOSIS — M5416 Radiculopathy, lumbar region: Secondary | ICD-10-CM

## 2013-09-10 MED ORDER — OXYCODONE HCL 15 MG PO TABS: 15.0000 mg | ORAL_TABLET | Freq: Three times a day (TID) | ORAL | Status: DC | PRN

## 2013-09-10 NOTE — Progress Notes (Signed)
Subjective:    Patient ID: Troy [name], male    DOB: Nov 15, 1945, 68 y.o.   MRN: [id number]  HPI: [name] is a 68 year old male who returns for follow up for chronic pain and medication refill. He says his pain is located in his right foot. He rates his pain 7. His current exercise regime is yard work he also walks in the mall occasionally.  Pain Inventory Average Pain 7 Pain Right Now 7 My pain is dull and aching  In the last 24 hours, has pain interfered with the following? General activity 7 Relation with others 7 Enjoyment of life 8 What TIME of day is your pain at its worst? day and night Sleep (in general) Fair  Pain is worse with: bending Pain improves with: medication Relief from Meds: 8  Mobility how many minutes can you walk? 20 ability to climb steps?  yes do you drive?  yes Do you have any goals in this area?  yes  Function not employed: date last employed 04/2009  Neuro/Psych bladder control problems bowel control problems depression  Prior Studies Any changes since last visit?  no  Physicians involved in your care Any changes since last visit?  no   Family History  Problem Relation Age of Onset  . Hypertension Mother   . Diabetes Mother   . Stroke Mother     multiple  . Hypertension Father   . Stroke Father   . Stroke Sister   . Stroke Daughter    History   Social History  . Marital Status: Married    Spouse Name: N/A    Number of Children: N/A  . Years of Education: N/A   Social History Main Topics  . Smoking status: Never Smoker   . Smokeless tobacco: Never Used  . Alcohol Use: No  . Drug Use: No  . Sexual Activity: None   Other Topics Concern  . None   Social History Narrative  . None   Past Surgical History  Procedure Laterality Date  . Cardiac surgery  2003    stent  . Trigger finger release    . Implantation vagal nerve stimulator    . Vagal nerve stimulator removal    . Tumor removal  2002    S1 tumor  removal by Dr. Annette Stable   Past Medical History  Diagnosis Date  . High blood pressure   . Diabetes mellitus   . Arthritis   . Plantar fasciitis    BP 146/67  Pulse 67  Resp 16  Ht 5\' 7"  (1.702 m)  Wt 168 lb (76.204 kg)  BMI 26.31 kg/m2  SpO2 98%  Opioid Risk Score:   Fall Risk Score: Low Fall Risk (0-5 points) (patient educated handout declined)   Review of Systems  Respiratory: Positive for apnea.   Gastrointestinal: Positive for diarrhea and constipation.  Genitourinary: Positive for difficulty urinating.  All other systems reviewed and are negative.      Objective:   Physical Exam  Nursing note and vitals reviewed. Constitutional: He is oriented to person, place, and time. He appears well-developed and well-nourished.  HENT:  Head: Normocephalic and atraumatic.  Neck: Normal range of motion. Neck supple.  Cardiovascular: Normal rate and regular rhythm.   Pulmonary/Chest: Effort normal and breath sounds normal.  Musculoskeletal:  Normal Muscle Bulk and Muscle testing Reveals: Upper Extremities: Full ROM and Muscle Strength 5/5 Lower Extremities: Full ROM and Muscle strength 5/5 Arises from chair with  ease Narrow Based gait  Neurological: He is alert and oriented to person, place, and time.  Skin: Skin is warm and dry.  Psychiatric: He has a normal mood and affect.          Assessment & Plan:  1. 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  2. Diabetic peripheral neuropathy affecting both feet: Endocrinologist Following  20 minutes of face to face patient care time was spent during this visit. All questions were encouraged and answered.   F/U in 1 month

## 2013-10-10 ENCOUNTER — Encounter: Payer: Medicare Other | Attending: Physical Medicine & Rehabilitation | Admitting: Registered Nurse

## 2013-10-10 ENCOUNTER — Encounter: Payer: Self-pay | Admitting: Registered Nurse

## 2013-10-10 VITALS — BP 148/71 | HR 79 | Resp 14 | Wt 165.6 lb

## 2013-10-10 DIAGNOSIS — Z5181 Encounter for therapeutic drug level monitoring: Secondary | ICD-10-CM

## 2013-10-10 DIAGNOSIS — IMO0002 Reserved for concepts with insufficient information to code with codable children: Secondary | ICD-10-CM

## 2013-10-10 DIAGNOSIS — Z79899 Other long term (current) drug therapy: Secondary | ICD-10-CM

## 2013-10-10 DIAGNOSIS — E1142 Type 2 diabetes mellitus with diabetic polyneuropathy: Secondary | ICD-10-CM | POA: Diagnosis not present

## 2013-10-10 DIAGNOSIS — D219 Benign neoplasm of connective and other soft tissue, unspecified: Secondary | ICD-10-CM | POA: Diagnosis not present

## 2013-10-10 DIAGNOSIS — G8929 Other chronic pain: Secondary | ICD-10-CM | POA: Insufficient documentation

## 2013-10-10 DIAGNOSIS — M5416 Radiculopathy, lumbar region: Secondary | ICD-10-CM

## 2013-10-10 DIAGNOSIS — E1149 Type 2 diabetes mellitus with other diabetic neurological complication: Secondary | ICD-10-CM | POA: Insufficient documentation

## 2013-10-10 DIAGNOSIS — M545 Low back pain, unspecified: Secondary | ICD-10-CM | POA: Insufficient documentation

## 2013-10-10 MED ORDER — OXYCODONE HCL 15 MG PO TABS: 15.0000 mg | ORAL_TABLET | Freq: Three times a day (TID) | ORAL | Status: DC | PRN

## 2013-10-10 NOTE — Progress Notes (Signed)
Subjective:    Patient ID: Troy [name], male    DOB: Oct 22, 1945, 69 y.o.   MRN: [id number]  HPI: [name] is a 68 year old male who returns for follow up for chronic pain and medication refill. He says his pain is located in his right foot. He rates his pain 7. His current exercise regime is yard work, walking and household chores.  Pain Inventory Average Pain 7 Pain Right Now 7 My pain is aching  In the last 24 hours, has pain interfered with the following? General activity 7 Relation with others 7 Enjoyment of life 6 What TIME of day is your pain at its worst? evening Sleep (in general) Fair  Pain is worse with: bending and some activites Pain improves with: medication Relief from Meds: 8  Mobility walk without assistance how many minutes can you walk? 20 ability to climb steps?  yes do you drive?  yes  Function retired  Neuro/Psych bladder control problems bowel control problems numbness depression  Prior Studies Any changes since last visit?  no  Physicians involved in your care Any changes since last visit?  no   Family History  Problem Relation Age of Onset  . Hypertension Mother   . Diabetes Mother   . Stroke Mother     multiple  . Hypertension Father   . Stroke Father   . Stroke Sister   . Stroke Daughter    History   Social History  . Marital Status: Married    Spouse Name: N/A    Number of Children: N/A  . Years of Education: N/A   Social History Main Topics  . Smoking status: Never Smoker   . Smokeless tobacco: Never Used  . Alcohol Use: No  . Drug Use: No  . Sexual Activity: None   Other Topics Concern  . None   Social History Narrative  . None   Past Surgical History  Procedure Laterality Date  . Cardiac surgery  2003    stent  . Trigger finger release    . Implantation vagal nerve stimulator    . Vagal nerve stimulator removal    . Tumor removal  2002    S1 tumor removal by Dr. Annette Stable   Past Medical History   Diagnosis Date  . High blood pressure   . Diabetes mellitus   . Arthritis   . Plantar fasciitis    BP 148/71  Pulse 79  Resp 14  Wt 165 lb 9.6 oz (75.116 kg)  SpO2 95%  Opioid Risk Score:   Fall Risk Score:  (previously educated and given handout) Review of Systems  Respiratory: Positive for apnea.   Gastrointestinal:       Bowel Control Problems  Endocrine:       High blood sugars  Genitourinary:       Bladder control problems  Neurological: Positive for numbness.  Psychiatric/Behavioral: Positive for dysphoric mood.  All other systems reviewed and are negative.      Objective:   Physical Exam  Nursing note and vitals reviewed. Constitutional: He is oriented to person, place, and time. He appears well-developed and well-nourished.  HENT:  Head: Normocephalic and atraumatic.  Neck: Normal range of motion. Neck supple.  Cardiovascular: Normal rate, regular rhythm and normal heart sounds.   Pulmonary/Chest: Effort normal and breath sounds normal.  Musculoskeletal:  Normal Muscle Bulk and Muscle Testing Reveals: Upper Extremities: Full ROM and Muscle Strength 5/5 Lower Extremities: Full ROM and Muscle  Strength 5/5 Arises from chair with ease Narrow based gait  Neurological: He is alert and oriented to person, place, and time.  Skin: Skin is warm and dry.  Psychiatric: He has a normal mood and affect.          Assessment & Plan:  1. 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  2. Diabetic peripheral neuropathy affecting both feet:  Endocrinologist Following   15 minutes of face to face patient care time was spent during this visit. All questions were encouraged and answered.   F/U in 1 month

## 2013-11-10 ENCOUNTER — Encounter: Payer: Self-pay | Admitting: Physical Medicine & Rehabilitation

## 2013-11-10 ENCOUNTER — Encounter: Payer: Medicare Other | Attending: Physical Medicine & Rehabilitation

## 2013-11-10 ENCOUNTER — Ambulatory Visit (HOSPITAL_BASED_OUTPATIENT_CLINIC_OR_DEPARTMENT_OTHER): Payer: Medicare Other | Admitting: Physical Medicine & Rehabilitation

## 2013-11-10 VITALS — BP 135/58 | HR 65 | Resp 14 | Wt 167.2 lb

## 2013-11-10 DIAGNOSIS — E1149 Type 2 diabetes mellitus with other diabetic neurological complication: Secondary | ICD-10-CM

## 2013-11-10 DIAGNOSIS — M545 Low back pain, unspecified: Secondary | ICD-10-CM | POA: Insufficient documentation

## 2013-11-10 DIAGNOSIS — E1142 Type 2 diabetes mellitus with diabetic polyneuropathy: Secondary | ICD-10-CM | POA: Diagnosis not present

## 2013-11-10 DIAGNOSIS — M5416 Radiculopathy, lumbar region: Secondary | ICD-10-CM

## 2013-11-10 DIAGNOSIS — IMO0002 Reserved for concepts with insufficient information to code with codable children: Secondary | ICD-10-CM

## 2013-11-10 DIAGNOSIS — Z79899 Other long term (current) drug therapy: Secondary | ICD-10-CM

## 2013-11-10 DIAGNOSIS — Z5181 Encounter for therapeutic drug level monitoring: Secondary | ICD-10-CM

## 2013-11-10 DIAGNOSIS — D219 Benign neoplasm of connective and other soft tissue, unspecified: Secondary | ICD-10-CM | POA: Diagnosis not present

## 2013-11-10 DIAGNOSIS — G8929 Other chronic pain: Secondary | ICD-10-CM | POA: Diagnosis not present

## 2013-11-10 MED ORDER — OXYCODONE HCL 15 MG PO TABS: 15.0000 mg | ORAL_TABLET | Freq: Three times a day (TID) | ORAL | Status: DC | PRN

## 2013-11-10 NOTE — Progress Notes (Signed)
   Subjective:    Patient ID: Troy [name], male    DOB: 02-25-1945, 68 y.o.   MRN: [id number]  HPI  Diabetic control is poor but improving. Hemoglobin A1c improved from 13-10  Follows up with endocrinologist tomorrow  His current exercise regime is yard work, walking and household chores.   Pain Inventory Average Pain 7 Pain Right Now 7 My pain is dull and aching  In the last 24 hours, has pain interfered with the following? General activity 7 Relation with others 7 Enjoyment of life 8 What TIME of day is your pain at its worst? morning and evening Sleep (in general) Fair  Pain is worse with: bending Pain improves with: medication Relief from Meds: 7  Mobility walk without assistance how many minutes can you walk? 20 ability to climb steps?  yes do you drive?  yes  Function retired  Neuro/Psych numbness  Prior Studies Any changes since last visit?  no  Physicians involved in your care Any changes since last visit?  no   Family History  Problem Relation Age of Onset  . Hypertension Mother   . Diabetes Mother   . Stroke Mother     multiple  . Hypertension Father   . Stroke Father   . Stroke Sister   . Stroke Daughter    History   Social History  . Marital Status: Married    Spouse Name: N/A    Number of Children: N/A  . Years of Education: N/A   Social History Main Topics  . Smoking status: Never Smoker   . Smokeless tobacco: Never Used  . Alcohol Use: No  . Drug Use: No  . Sexual Activity: None   Other Topics Concern  . None   Social History Narrative  . None   Past Surgical History  Procedure Laterality Date  . Cardiac surgery  2003    stent  . Trigger finger release    . Implantation vagal nerve stimulator    . Vagal nerve stimulator removal    . Tumor removal  2002    S1 tumor removal by Dr. Annette Stable   Past Medical History  Diagnosis Date  . High blood pressure   . Diabetes mellitus   . Arthritis   . Plantar fasciitis    BP  135/58  Pulse 65  Resp 14  Wt 167 lb 3.2 oz (75.841 kg)  SpO2 99%  Opioid Risk Score:   Fall Risk Score: Moderate Fall Risk (6-13 points) (previoulsy educated and given handout) Review of Systems     Objective:   Physical Exam  Decreased Achilles reflex on the right, normal on the left Normal patellar reflexes bilateral. Motor strength 5/5 bilateral ankle dorsiflexor plantar flexor Normal pedal pulses bilateral No evidence of skin breakdown. Mild foot intrinsic muscle atrophy     Assessment & Plan:  1. 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   2. Diabetic peripheral neuropathy affecting both feet and affecting balance.   Continue current medications oxycodone 15 mg 3 times a day   this is being prescribed by our office Continue gabapentin 400 mg 4 times per day  , prescribed by primary care Pamelor qhs  , prescribed by primary care Return to clinic one month NP visit Urine drug screen today

## 2013-11-10 NOTE — Patient Instructions (Signed)
Keep up with the activity, yardwork, housework  Keep diabetes controlled

## 2013-12-11 ENCOUNTER — Other Ambulatory Visit: Payer: Self-pay | Admitting: *Deleted

## 2013-12-11 MED ORDER — OXYCODONE HCL 15 MG PO TABS: 15.0000 mg | ORAL_TABLET | Freq: Three times a day (TID) | ORAL | Status: DC | PRN

## 2013-12-11 NOTE — Telephone Encounter (Signed)
Narcotic rx printed for MD to sign for RN med refill visit 

## 2013-12-12 ENCOUNTER — Encounter: Payer: Medicare Other | Admitting: Registered Nurse

## 2013-12-12 ENCOUNTER — Encounter: Payer: Medicare Other | Attending: Physical Medicine & Rehabilitation | Admitting: *Deleted

## 2013-12-12 VITALS — BP 137/50 | HR 64 | Resp 14 | Ht 67.0 in | Wt 166.0 lb

## 2013-12-12 DIAGNOSIS — Z5181 Encounter for therapeutic drug level monitoring: Secondary | ICD-10-CM

## 2013-12-12 DIAGNOSIS — Z760 Encounter for issue of repeat prescription: Secondary | ICD-10-CM | POA: Diagnosis not present

## 2013-12-12 DIAGNOSIS — M5416 Radiculopathy, lumbar region: Secondary | ICD-10-CM

## 2013-12-12 NOTE — Progress Notes (Signed)
Here for pill count and medication refills. Oxycodone 15 mg # 81  Fill date  11/20/13  Today NV# 21  VSS   Pain level:6    No falls and has previously been educated and given handout.  Pill count appropriate and given refill.  He will return in one month to see NP

## 2014-01-09 ENCOUNTER — Encounter: Payer: Medicare Other | Admitting: Registered Nurse

## 2014-01-12 ENCOUNTER — Encounter: Payer: Self-pay | Admitting: Physical Medicine & Rehabilitation

## 2014-01-12 ENCOUNTER — Encounter: Payer: Medicare Other | Attending: Physical Medicine & Rehabilitation

## 2014-01-12 ENCOUNTER — Ambulatory Visit (HOSPITAL_BASED_OUTPATIENT_CLINIC_OR_DEPARTMENT_OTHER): Payer: Medicare Other | Admitting: Physical Medicine & Rehabilitation

## 2014-01-12 VITALS — BP 143/43 | HR 68 | Resp 14 | Ht 67.0 in | Wt 162.0 lb

## 2014-01-12 DIAGNOSIS — E1140 Type 2 diabetes mellitus with diabetic neuropathy, unspecified: Secondary | ICD-10-CM

## 2014-01-12 DIAGNOSIS — Z760 Encounter for issue of repeat prescription: Secondary | ICD-10-CM | POA: Diagnosis not present

## 2014-01-12 DIAGNOSIS — M5416 Radiculopathy, lumbar region: Secondary | ICD-10-CM

## 2014-01-12 DIAGNOSIS — E1149 Type 2 diabetes mellitus with other diabetic neurological complication: Secondary | ICD-10-CM

## 2014-01-12 MED ORDER — OXYCODONE HCL 15 MG PO TABS: 15.0000 mg | ORAL_TABLET | Freq: Three times a day (TID) | ORAL | Status: DC | PRN

## 2014-01-12 NOTE — Patient Instructions (Signed)
No ladders, based on your nerve damage your balance will be affected and at risk for falling

## 2014-01-12 NOTE — Progress Notes (Signed)
Subjective:    Patient ID: Troy [name], male    DOB: 1945/07/31, 68 y.o.   MRN: [id number]  HPI  No new issues Seen by PCP, Hgb A1C down from 13 to 11 Blowing leaves and getting up on roof with ladder Pain Inventory Average Pain 7 Pain Right Now 7 My pain is intermittent, dull and aching  In the last 24 hours, has pain interfered with the following? General activity 7 Relation with others 7 Enjoyment of life 8 What TIME of day is your pain at its worst? daytime and evening Sleep (in general) Fair  Pain is worse with: bending Pain improves with: medication Relief from Meds: 8  Mobility walk without assistance how many minutes can you walk? 20 ability to climb steps?  yes do you drive?  yes  Function retired  Neuro/Psych bowel control problems  Prior Studies Any changes since last visit?  no  Physicians involved in your care Any changes since last visit?  no   Family History  Problem Relation Age of Onset  . Hypertension Mother   . Diabetes Mother   . Stroke Mother     multiple  . Hypertension Father   . Stroke Father   . Stroke Sister   . Stroke Daughter    History   Social History  . Marital Status: Married    Spouse Name: N/A    Number of Children: N/A  . Years of Education: N/A   Social History Main Topics  . Smoking status: Never Smoker   . Smokeless tobacco: Never Used  . Alcohol Use: No  . Drug Use: No  . Sexual Activity: None   Other Topics Concern  . None   Social History Narrative   Past Surgical History  Procedure Laterality Date  . Cardiac surgery  2003    stent  . Trigger finger release    . Implantation vagal nerve stimulator    . Vagal nerve stimulator removal    . Tumor removal  2002    S1 tumor removal by Dr. Annette Stable   Past Medical History  Diagnosis Date  . High blood pressure   . Diabetes mellitus   . Arthritis   . Plantar fasciitis    BP 143/43 mmHg  Pulse 68  Resp 14  Ht 5\' 7"  (1.702 m)  Wt 162 lb  (73.483 kg)  BMI 25.37 kg/m2  SpO2 95%  Opioid Risk Score:   Fall Risk Score: Low Fall Risk (0-5 points)   Review of Systems  Constitutional: Negative.   HENT: Negative.   Eyes: Negative.   Respiratory: Negative.   Cardiovascular: Negative.   Gastrointestinal:       Bowel control problems   Endocrine: Negative.   Genitourinary: Negative.   Musculoskeletal: Negative.   Skin: Negative.   Allergic/Immunologic: Negative.   Neurological: Negative.   Hematological: Negative.   Psychiatric/Behavioral: Positive for dysphoric mood.       Objective:   Physical Exam  Constitutional: He is oriented to person, place, and time. He appears well-developed and well-nourished.  HENT:  Head: Normocephalic and atraumatic.  HOH  Neurological: He is alert and oriented to person, place, and time.  Psychiatric: He has a normal mood and affect.  Nursing note and vitals reviewed.  Decreased Achilles reflex on the right, normal on the left  Normal patellar reflexes bilateral.  Motor strength 5/5 bilateral ankle dorsiflexor plantar flexor  Normal pedal pulses bilateral  No evidence of skin breakdown.  Mild foot  intrinsic muscle atrophy        Assessment & Plan:  1. 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  2. Diabetic peripheral neuropathy affecting both feet and affecting balance.  Continue current medications oxycodone 15 mg 3 times a day this is being prescribed by our office  Continue gabapentin 400 mg 4 times per day , prescribed by primary care  Pamelor qhs , prescribed by primary care  Return to clinic one month NP visit   We discussed my recommendations that he not go up and down ladders because of his medical conditions. His wife is in agreement but states that he may not listen to her. We discussed fall risk and potential for injury requiring hospitalization.

## 2014-02-10 ENCOUNTER — Encounter: Payer: Medicare Other | Attending: Physical Medicine & Rehabilitation | Admitting: Registered Nurse

## 2014-02-10 ENCOUNTER — Encounter: Payer: Self-pay | Admitting: Registered Nurse

## 2014-02-10 ENCOUNTER — Other Ambulatory Visit: Payer: Self-pay | Admitting: Physical Medicine & Rehabilitation

## 2014-02-10 VITALS — BP 135/46 | HR 70 | Resp 14

## 2014-02-10 DIAGNOSIS — Z760 Encounter for issue of repeat prescription: Secondary | ICD-10-CM | POA: Diagnosis not present

## 2014-02-10 DIAGNOSIS — E1149 Type 2 diabetes mellitus with other diabetic neurological complication: Secondary | ICD-10-CM

## 2014-02-10 DIAGNOSIS — Z5181 Encounter for therapeutic drug level monitoring: Secondary | ICD-10-CM

## 2014-02-10 DIAGNOSIS — G894 Chronic pain syndrome: Secondary | ICD-10-CM

## 2014-02-10 DIAGNOSIS — Z79899 Other long term (current) drug therapy: Secondary | ICD-10-CM

## 2014-02-10 DIAGNOSIS — E1140 Type 2 diabetes mellitus with diabetic neuropathy, unspecified: Secondary | ICD-10-CM

## 2014-02-10 MED ORDER — OXYCODONE HCL 15 MG PO TABS: 15.0000 mg | ORAL_TABLET | Freq: Three times a day (TID) | ORAL | Status: DC | PRN

## 2014-02-10 NOTE — Progress Notes (Signed)
Subjective:    Patient ID: Troy [name], male    DOB: 1945/08/05, 68 y.o.   MRN: [id number]  HPI: [name] is a 68 year old male who returns for follow up for chronic pain and medication refill. He says his pain is located in his right foot. He rates his pain 8. His current exercise regime is yard work, walking and household chores.  Pain Inventory Average Pain 7 Pain Right Now 8 My pain is burning and tingling  In the last 24 hours, has pain interfered with the following? General activity 7 Relation with others 7 Enjoyment of life 8 What TIME of day is your pain at its worst? daytime, evening Sleep (in general) Fair  Pain is worse with: bending and some activites Pain improves with: medication Relief from Meds: 8  Mobility walk without assistance how many minutes can you walk? 20 ability to climb steps?  yes do you drive?  yes Do you have any goals in this area?  yes  Function retired Do you have any goals in this area?  yes  Neuro/Psych bowel control problems depression  Prior Studies Any changes since last visit?  no  Physicians involved in your care Any changes since last visit?  no   Family History  Problem Relation Age of Onset  . Hypertension Mother   . Diabetes Mother   . Stroke Mother     multiple  . Hypertension Father   . Stroke Father   . Stroke Sister   . Stroke Daughter    History   Social History  . Marital Status: Married    Spouse Name: N/A    Number of Children: N/A  . Years of Education: N/A   Social History Main Topics  . Smoking status: Never Smoker   . Smokeless tobacco: Never Used  . Alcohol Use: No  . Drug Use: No  . Sexual Activity: None   Other Topics Concern  . None   Social History Narrative   Past Surgical History  Procedure Laterality Date  . Cardiac surgery  2003    stent  . Trigger finger release    . Implantation vagal nerve stimulator    . Vagal nerve stimulator removal    . Tumor removal   2002    S1 tumor removal by Dr. Annette Stable   Past Medical History  Diagnosis Date  . High blood pressure   . Diabetes mellitus   . Arthritis   . Plantar fasciitis    BP 135/46 mmHg  Pulse 70  Resp 14  SpO2 96%  Opioid Risk Score:   Fall Risk Score: Low Fall Risk (0-5 points) (pt declined pamphlet) Review of Systems  Gastrointestinal:       Bowel control problems  Psychiatric/Behavioral: Positive for dysphoric mood.  All other systems reviewed and are negative.      Objective:   Physical Exam  Constitutional: He is oriented to person, place, and time. He appears well-developed and well-nourished.  HENT:  Head: Normocephalic and atraumatic.  Neck: Normal range of motion. Neck supple.  Cardiovascular: Normal rate and regular rhythm.   Pulmonary/Chest: Effort normal and breath sounds normal.  Musculoskeletal:  Normal Muscle Bulk and Muscle Testing Reveals: Upper Extremities: Full ROM and Muscle Strengthen 5/5 Lower Extremities: Full ROM and Muscle strength 5/5 Arises from chair with ease Narrow based Gait  Neurological: He is alert and oriented to person, place, and time.  Skin: Skin is warm and dry.  Psychiatric: He has a normal mood and affect.  Nursing note and vitals reviewed.         Assessment & Plan:  1. 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  2. Diabetic peripheral neuropathy affecting both feet:  Endocrinologist Following   15 minutes of face to face patient care time was spent during this visit. All questions were encouraged and answered.   F/U in 1 month

## 2014-02-11 LAB — PMP ALCOHOL METABOLITE (ETG): Ethyl Glucuronide (EtG): NEGATIVE ng/mL

## 2014-02-16 LAB — OPIATES/OPIOIDS (LC/MS-MS)
Codeine Urine: NEGATIVE ng/mL (ref <50)
HYDROCODONE: NEGATIVE ng/mL (ref <50)
HYDROMORPHONE: NEGATIVE ng/mL (ref <50)
Morphine Urine: NEGATIVE ng/mL (ref <50)
Norhydrocodone, Ur: NEGATIVE ng/mL (ref <50)
Noroxycodone, Ur: 3233 ng/mL (ref <50)
Oxycodone, ur: 2693 ng/mL (ref <50)
Oxymorphone: 1521 ng/mL (ref <50)

## 2014-02-16 LAB — OXYCODONE, URINE (LC/MS-MS)
NOROXYCODONE, UR: 3233 ng/mL (ref <50)
Oxycodone, ur: 2693 ng/mL (ref <50)
Oxymorphone: 1521 ng/mL (ref <50)

## 2014-02-16 LAB — MDMA (ECSTASY), URINE
MDA GC/MS confirm: NEGATIVE ng/mL (ref <200)
MDMA GC/MS Conf: NEGATIVE ng/mL (ref <200)

## 2014-02-18 LAB — PRESCRIPTION MONITORING PROFILE (SOLSTAS)
Amphetamine/Meth: NEGATIVE ng/mL
Barbiturate Screen, Urine: NEGATIVE ng/mL
Benzodiazepine Screen, Urine: NEGATIVE ng/mL
Buprenorphine, Urine: NEGATIVE ng/mL
Cannabinoid Scrn, Ur: NEGATIVE ng/mL
Carisoprodol, Urine: NEGATIVE ng/mL
Cocaine Metabolites: NEGATIVE ng/mL
Creatinine, Urine: 114.39 mg/dL (ref >20.0)
Fentanyl, Ur: NEGATIVE ng/mL
MEPERIDINE UR: NEGATIVE ng/mL
Methadone Screen, Urine: NEGATIVE ng/mL
Nitrites, Initial: NEGATIVE ug/mL
PH URINE, INITIAL: 5.5 pH (ref 4.5–8.9)
Propoxyphene: NEGATIVE ng/mL
TRAMADOL UR: NEGATIVE ng/mL
Tapentadol, urine: NEGATIVE ng/mL
Zolpidem, Urine: NEGATIVE ng/mL

## 2014-02-25 NOTE — Progress Notes (Signed)
Urine drug screen for this encounter is consistent for prescribed medication 

## 2014-03-17 ENCOUNTER — Encounter: Payer: Self-pay | Admitting: Registered Nurse

## 2014-03-17 ENCOUNTER — Encounter: Payer: Medicare Other | Attending: Physical Medicine & Rehabilitation | Admitting: Registered Nurse

## 2014-03-17 VITALS — BP 142/57 | HR 72 | Resp 18

## 2014-03-17 DIAGNOSIS — E1140 Type 2 diabetes mellitus with diabetic neuropathy, unspecified: Secondary | ICD-10-CM

## 2014-03-17 DIAGNOSIS — Z5181 Encounter for therapeutic drug level monitoring: Secondary | ICD-10-CM

## 2014-03-17 DIAGNOSIS — E1149 Type 2 diabetes mellitus with other diabetic neurological complication: Secondary | ICD-10-CM

## 2014-03-17 DIAGNOSIS — G894 Chronic pain syndrome: Secondary | ICD-10-CM

## 2014-03-17 DIAGNOSIS — Z760 Encounter for issue of repeat prescription: Secondary | ICD-10-CM | POA: Diagnosis not present

## 2014-03-17 DIAGNOSIS — Z79899 Other long term (current) drug therapy: Secondary | ICD-10-CM

## 2014-03-17 MED ORDER — OXYCODONE HCL 15 MG PO TABS: 15.0000 mg | ORAL_TABLET | Freq: Three times a day (TID) | ORAL | Status: DC | PRN

## 2014-03-17 NOTE — Progress Notes (Signed)
Subjective:    Patient ID: Troy [name], male    DOB: 1945/06/12, 69 y.o.   MRN: [id number]  HPI: Troy [name] is a 69 year old male who returns for follow up for chronic pain and medication refill. He says his pain is located in his right foot. He rates his pain 7. His current exercise regime is shoveling, walking and household chores.  Pain Inventory Average Pain 7 Pain Right Now 7 My pain is aching  In the last 24 hours, has pain interfered with the following? General activity 7 Relation with others 8 Enjoyment of life 8 What TIME of day is your pain at its worst? evening Sleep (in general) Good  Pain is worse with: bending and some activites Pain improves with: rest and medication Relief from Meds: 8  Mobility walk without assistance how many minutes can you walk? 20 ability to climb steps?  yes do you drive?  yes Do you have any goals in this area?  yes  Function retired Do you have any goals in this area?  yes  Neuro/Psych depression  Prior Studies Any changes since last visit?  no  Physicians involved in your care Any changes since last visit?  no   Family History  Problem Relation Age of Onset  . Hypertension Mother   . Diabetes Mother   . Stroke Mother     multiple  . Hypertension Father   . Stroke Father   . Stroke Sister   . Stroke Daughter    History   Social History  . Marital Status: Married    Spouse Name: N/A    Number of Children: N/A  . Years of Education: N/A   Social History Main Topics  . Smoking status: Never Smoker   . Smokeless tobacco: Never Used  . Alcohol Use: No  . Drug Use: No  . Sexual Activity: None   Other Topics Concern  . None   Social History Narrative   Past Surgical History  Procedure Laterality Date  . Cardiac surgery  2003    stent  . Trigger finger release    . Implantation vagal nerve stimulator    . Vagal nerve stimulator removal    . Tumor removal  2002    S1 tumor removal by Dr. Annette Stable     Past Medical History  Diagnosis Date  . High blood pressure   . Diabetes mellitus   . Arthritis   . Plantar fasciitis    BP 142/57 mmHg  Pulse 72  Resp 18  SpO2 95%  Opioid Risk Score:   Fall Risk Score: Low Fall Risk (0-5 points)  Review of Systems  Respiratory: Positive for apnea.   Endocrine:       High blood sugar  Psychiatric/Behavioral: Positive for dysphoric mood.  All other systems reviewed and are negative.      Objective:   Physical Exam  Constitutional: He is oriented to person, place, and time. He appears well-developed and well-nourished.  HENT:  Head: Normocephalic and atraumatic.  Neck: Normal range of motion. Neck supple.  Cardiovascular: Normal rate and regular rhythm.   Pulmonary/Chest: Effort normal and breath sounds normal.  Musculoskeletal:  Normal Muscle Bulk and Muscle Testing Reveals: Upper Extremities: Full ROM and Muscle strength 5/5 Lower Extremities: Full ROM and Muscle strength 5/5 Arises from chair with ease Narrow Based gait  Neurological: He is alert and oriented to person, place, and time.  Skin: Skin is warm and dry.  Psychiatric: He has a normal mood and affect.  Nursing note and vitals reviewed.         Assessment & Plan:  1. 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  2. Diabetic peripheral neuropathy affecting both feet:  Continue Gabapentin. Endocrinologist Following   15 minutes of face to face patient care time was spent during this visit. All questions were encouraged and answered.   F/U in 1 month

## 2014-04-13 ENCOUNTER — Encounter: Payer: Self-pay | Admitting: Registered Nurse

## 2014-04-13 ENCOUNTER — Encounter: Payer: Medicare Other | Attending: Physical Medicine & Rehabilitation | Admitting: Registered Nurse

## 2014-04-13 VITALS — BP 114/57 | HR 63 | Resp 14

## 2014-04-13 DIAGNOSIS — G894 Chronic pain syndrome: Secondary | ICD-10-CM

## 2014-04-13 DIAGNOSIS — Z5181 Encounter for therapeutic drug level monitoring: Secondary | ICD-10-CM

## 2014-04-13 DIAGNOSIS — Z760 Encounter for issue of repeat prescription: Secondary | ICD-10-CM | POA: Diagnosis not present

## 2014-04-13 DIAGNOSIS — E1140 Type 2 diabetes mellitus with diabetic neuropathy, unspecified: Secondary | ICD-10-CM

## 2014-04-13 DIAGNOSIS — E1149 Type 2 diabetes mellitus with other diabetic neurological complication: Secondary | ICD-10-CM

## 2014-04-13 DIAGNOSIS — Z79899 Other long term (current) drug therapy: Secondary | ICD-10-CM

## 2014-04-13 MED ORDER — OXYCODONE HCL 15 MG PO TABS: 15.0000 mg | ORAL_TABLET | Freq: Three times a day (TID) | ORAL | Status: DC | PRN

## 2014-04-13 NOTE — Progress Notes (Signed)
Subjective:    Patient ID: Troy [name], male    DOB: [birth year], 69 y.o.   MRN: [id number]  HPI: [name] is a 69 year old male who returns for follow up for chronic pain and medication refill. He says his pain is located in his right foot. He rates his pain 7. His current exercise regime is walking.  Pain Inventory Average Pain 7 Pain Right Now 7 My pain is aching  In the last 24 hours, has pain interfered with the following? General activity 7 Relation with others 8 Enjoyment of life 8 What TIME of day is your pain at its worst? evening Sleep (in general) Good  Pain is worse with: bending Pain improves with: rest and medication Relief from Meds: 7  Mobility walk without assistance how many minutes can you walk? 20 ability to climb steps?  yes do you drive?  yes Do you have any goals in this area?  yes  Function retired Do you have any goals in this area?  yes  Neuro/Psych bladder control problems depression  Prior Studies Any changes since last visit?  no  Physicians involved in your care Any changes since last visit?  no   Family History  Problem Relation Age of Onset  . Hypertension Mother   . Diabetes Mother   . Stroke Mother     multiple  . Hypertension Father   . Stroke Father   . Stroke Sister   . Stroke Daughter    History   Social History  . Marital Status: Married    Spouse Name: N/A  . Number of Children: N/A  . Years of Education: N/A   Social History Main Topics  . Smoking status: Never Smoker   . Smokeless tobacco: Never Used  . Alcohol Use: No  . Drug Use: No  . Sexual Activity: Not on file   Other Topics Concern  . None   Social History Narrative   Past Surgical History  Procedure Laterality Date  . Cardiac surgery  2003    stent  . Trigger finger release    . Implantation vagal nerve stimulator    . Vagal nerve stimulator removal    . Tumor removal  2002    S1 tumor removal by Dr. Annette Stable   Past Medical  History  Diagnosis Date  . High blood pressure   . Diabetes mellitus   . Arthritis   . Plantar fasciitis    BP 114/57 mmHg  Pulse 63  Resp 14  SpO2 96%  Opioid Risk Score:   Fall Risk Score: Low Fall Risk (0-5 points)  Review of Systems  Genitourinary: Positive for dysuria.  Psychiatric/Behavioral: Positive for dysphoric mood.  All other systems reviewed and are negative.      Objective:   Physical Exam  Constitutional: He is oriented to person, place, and time. He appears well-developed and well-nourished.  HENT:  Head: Normocephalic and atraumatic.  Neck: Normal range of motion.  Cardiovascular: Normal rate and regular rhythm.   Pulmonary/Chest: Effort normal and breath sounds normal.  Musculoskeletal:  Normal Muscle Bulk and Muscle Testing Reveals: Upper Extremities: Full ROM and Muscle strength 5/5 Lower Extremities: Full ROM and Muscle strength 5/5 Arises from chair with ease Narrow Based Gait  Neurological: He is alert and oriented to person, place, and time.  Skin: Skin is warm and dry.  Psychiatric: He has a normal mood and affect.  Nursing note and vitals reviewed.  Assessment & Plan:  1. 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  2. Diabetic peripheral neuropathy affecting both feet: Continue Gabapentin. Endocrinologist Following   15 minutes of face to face patient care time was spent during this visit. All questions were encouraged and answered.   F/U in 1 month

## 2014-05-12 ENCOUNTER — Encounter: Payer: Self-pay | Admitting: Registered Nurse

## 2014-05-12 ENCOUNTER — Encounter: Payer: Medicare Other | Attending: Physical Medicine & Rehabilitation | Admitting: Registered Nurse

## 2014-05-12 VITALS — BP 123/44 | HR 62 | Resp 14

## 2014-05-12 DIAGNOSIS — E1149 Type 2 diabetes mellitus with other diabetic neurological complication: Secondary | ICD-10-CM

## 2014-05-12 DIAGNOSIS — Z760 Encounter for issue of repeat prescription: Secondary | ICD-10-CM | POA: Insufficient documentation

## 2014-05-12 DIAGNOSIS — Z79899 Other long term (current) drug therapy: Secondary | ICD-10-CM

## 2014-05-12 DIAGNOSIS — Z5181 Encounter for therapeutic drug level monitoring: Secondary | ICD-10-CM

## 2014-05-12 DIAGNOSIS — G894 Chronic pain syndrome: Secondary | ICD-10-CM

## 2014-05-12 DIAGNOSIS — E1140 Type 2 diabetes mellitus with diabetic neuropathy, unspecified: Secondary | ICD-10-CM

## 2014-05-12 MED ORDER — OXYCODONE HCL 15 MG PO TABS: 15.0000 mg | ORAL_TABLET | Freq: Three times a day (TID) | ORAL | Status: DC | PRN

## 2014-05-12 NOTE — Progress Notes (Signed)
Subjective:    Patient ID: Troy [name], male    DOB: 02-Jun-1945, 69 y.o.   MRN: [id number]  HPI:  [name] is a 69 year old male who returns for follow up for chronic pain and medication refill. He says his pain is located in his right foot. He rates his pain 8. His current exercise regime is walking and yard work when weather permits.  Pain Inventory Average Pain 7 Pain Right Now 8 My pain is aching  In the last 24 hours, has pain interfered with the following? General activity 7 Relation with others 6 Enjoyment of life 8 What TIME of day is your pain at its worst? evening Sleep (in general) Fair  Pain is worse with: bending and inactivity Pain improves with: rest and medication Relief from Meds: 8  Mobility walk without assistance how many minutes can you walk? 20 ability to climb steps?  yes do you drive?  yes Do you have any goals in this area?  yes  Function retired  Neuro/Psych depression  Prior Studies Any changes since last visit?  no  Physicians involved in your care Any changes since last visit?  no   Family History  Problem Relation Age of Onset  . Hypertension Mother   . Diabetes Mother   . Stroke Mother     multiple  . Hypertension Father   . Stroke Father   . Stroke Sister   . Stroke Daughter    History   Social History  . Marital Status: Married    Spouse Name: N/A  . Number of Children: N/A  . Years of Education: N/A   Social History Main Topics  . Smoking status: Never Smoker   . Smokeless tobacco: Never Used  . Alcohol Use: No  . Drug Use: No  . Sexual Activity: Not on file   Other Topics Concern  . None   Social History Narrative   Past Surgical History  Procedure Laterality Date  . Cardiac surgery  2003    stent  . Trigger finger release    . Implantation vagal nerve stimulator    . Vagal nerve stimulator removal    . Tumor removal  2002    S1 tumor removal by Dr. Annette [name]   Past Medical History    Diagnosis Date  . High blood pressure   . Diabetes mellitus   . Arthritis   . Plantar fasciitis    There were no vitals taken for this visit.  Opioid Risk Score:   Fall Risk Score: Low Fall Risk (0-5 points)`1  Depression screen PHQ 2/9  Depression screen PHQ 2/9 05/12/2014  Decreased Interest 0  Down, Depressed, Hopeless 0  PHQ - 2 Score 0  Altered sleeping 0  Tired, decreased energy 0  Change in appetite 0  Feeling bad or failure about yourself  0  Trouble concentrating 0  Moving slowly or fidgety/restless 0  Suicidal thoughts 0  PHQ-9 Score 0     Review of Systems  Respiratory: Positive for apnea.   Endocrine:       High blood sugar  Psychiatric/Behavioral: Positive for dysphoric mood.  All other systems reviewed and are negative.      Objective:   Physical Exam  Constitutional: He is oriented to person, place, and time. He appears well-developed and well-nourished.  HENT:  Head: Normocephalic and atraumatic.  Neck: Normal range of motion. Neck supple.  Cardiovascular: Normal rate and regular rhythm.   Pulmonary/Chest: Effort  normal and breath sounds normal.  Musculoskeletal:  Normal Muscle Bulk and Muscle Testing Reveals: Upper Extremities: Full ROM and Muscle Strength 5/5 Lower Extremities: Full ROM and Muscle Strength 5/5 Arises from chair with ease Narrow Based Gait    Neurological: He is alert and oriented to person, place, and time.  Skin: Skin is warm and dry.  Psychiatric: He has a normal mood and affect.  Nursing note and vitals reviewed.         Assessment & Plan:  1. 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  2. Diabetic peripheral neuropathy affecting both feet: Continue Gabapentin. Endocrinologist Following   15 minutes of face to face patient care time was spent during this visit. All questions were encouraged and answered.   F/U in 1 month

## 2014-06-09 ENCOUNTER — Other Ambulatory Visit: Payer: Self-pay | Admitting: Physical Medicine & Rehabilitation

## 2014-06-09 ENCOUNTER — Encounter: Payer: Self-pay | Admitting: Registered Nurse

## 2014-06-09 ENCOUNTER — Encounter: Payer: Medicare Other | Attending: Physical Medicine & Rehabilitation | Admitting: Registered Nurse

## 2014-06-09 VITALS — BP 161/59 | HR 63 | Resp 16

## 2014-06-09 DIAGNOSIS — E1140 Type 2 diabetes mellitus with diabetic neuropathy, unspecified: Secondary | ICD-10-CM

## 2014-06-09 DIAGNOSIS — Z760 Encounter for issue of repeat prescription: Secondary | ICD-10-CM | POA: Insufficient documentation

## 2014-06-09 DIAGNOSIS — G894 Chronic pain syndrome: Secondary | ICD-10-CM | POA: Diagnosis not present

## 2014-06-09 DIAGNOSIS — Z79899 Other long term (current) drug therapy: Secondary | ICD-10-CM

## 2014-06-09 DIAGNOSIS — E1149 Type 2 diabetes mellitus with other diabetic neurological complication: Secondary | ICD-10-CM

## 2014-06-09 DIAGNOSIS — Z5181 Encounter for therapeutic drug level monitoring: Secondary | ICD-10-CM

## 2014-06-09 MED ORDER — OXYCODONE HCL 15 MG PO TABS: 15.0000 mg | ORAL_TABLET | Freq: Three times a day (TID) | ORAL | Status: DC | PRN

## 2014-06-09 NOTE — Progress Notes (Signed)
Subjective:    Patient ID: Troy [name], male    DOB: 05-14-1945, 69 y.o.   MRN: [id number]  HPI: [name] is a 69 year old male who returns for follow up for chronic pain and medication refill. He says his pain is located in his right foot. He rates his pain 7. His current exercise regime is walking, house work and yard work. Arrived with elevated blood pressure 161/59 and blood pressure re-checked 156/41.  Pain Inventory Average Pain 7 Pain Right Now 7 My pain is aching  In the last 24 hours, has pain interfered with the following? General activity 7 Relation with others 6 Enjoyment of life 8 What TIME of day is your pain at its worst? evening Sleep (in general) Good  Pain is worse with: bending and some activites Pain improves with: rest and medication Relief from Meds: 8  Mobility how many minutes can you walk? 20 ability to climb steps?  yes do you drive?  yes  Function retired Do you have any goals in this area?  yes  Neuro/Psych bladder control problems bowel control problems depression  Prior Studies Any changes since last visit?  no  Physicians involved in your care Any changes since last visit?  no   Family History  Problem Relation Age of Onset  . Hypertension Mother   . Diabetes Mother   . Stroke Mother     multiple  . Hypertension Father   . Stroke Father   . Stroke Sister   . Stroke Daughter    History   Social History  . Marital Status: Married    Spouse Name: N/A  . Number of Children: N/A  . Years of Education: N/A   Social History Main Topics  . Smoking status: Never Smoker   . Smokeless tobacco: Never Used  . Alcohol Use: No  . Drug Use: No  . Sexual Activity: Not on file   Other Topics Concern  . None   Social History Narrative   Past Surgical History  Procedure Laterality Date  . Cardiac surgery  2003    stent  . Trigger finger release    . Implantation vagal nerve stimulator    . Vagal nerve stimulator  removal    . Tumor removal  2002    S1 tumor removal by Dr. Annette Stable   Past Medical History  Diagnosis Date  . High blood pressure   . Diabetes mellitus   . Arthritis   . Plantar fasciitis    BP 161/59 mmHg  Pulse 63  Resp 16  SpO2 95%  Opioid Risk Score:   Fall Risk Score: Moderate Fall Risk (6-13 points) (previously educated and given handout)`1  Depression screen PHQ 2/9  Depression screen [facility] 2/9 05/12/2014  Decreased Interest 0  Down, Depressed, Hopeless 0  PHQ - 2 Score 0  Altered sleeping 0  Tired, decreased energy 0  Change in appetite 0  Feeling bad or failure about yourself  0  Trouble concentrating 0  Moving slowly or fidgety/restless 0  Suicidal thoughts 0  PHQ-9 Score 0    Review of Systems  Respiratory: Positive for apnea.   Gastrointestinal:       Bowel Control Problems  Endocrine:       High blood sugar  Genitourinary:       Bladder control problems  Psychiatric/Behavioral: Positive for dysphoric mood.  All other systems reviewed and are negative.      Objective:   Physical  Exam  Constitutional: He is oriented to person, place, and time. He appears well-developed and well-nourished.  HENT:  Head: Normocephalic and atraumatic.  Neck: Normal range of motion. Neck supple.  Cardiovascular: Normal rate and regular rhythm.   Pulmonary/Chest: Effort normal and breath sounds normal.  Musculoskeletal:  Normal Muscle Bulk and Muscle Testing Reveals: Upper Extremities: Full ROM and Muscle Strength 5/5 Back without spinal or paraspinal tenderness Lower Extremities: Full ROM and Muscle Strength 5/5 Arises from chair with ease Narrow Based Gait  Neurological: He is alert and oriented to person, place, and time.  Skin: Skin is warm and dry.  Psychiatric: He has a normal mood and affect.  Nursing note and vitals reviewed.         Assessment & Plan:  1. 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  2. Diabetic peripheral neuropathy affecting both feet: Continue Gabapentin. Endocrinologist Following   15 minutes of face to face patient care time was spent during this visit. All questions were encouraged and answered.   F/U in 1 month

## 2014-06-10 LAB — PMP ALCOHOL METABOLITE (ETG): ETGU: NEGATIVE ng/mL

## 2014-06-12 LAB — OPIATES/OPIOIDS (LC/MS-MS)
CODEINE URINE: NEGATIVE ng/mL (ref <50)
Hydrocodone: NEGATIVE ng/mL (ref <50)
Hydromorphone: NEGATIVE ng/mL (ref <50)
Morphine Urine: NEGATIVE ng/mL (ref <50)
Norhydrocodone, Ur: NEGATIVE ng/mL (ref <50)
Noroxycodone, Ur: 2581 ng/mL (ref <50)
Oxycodone, ur: 1989 ng/mL (ref <50)
Oxymorphone: 1493 ng/mL (ref <50)

## 2014-06-12 LAB — MDMA (ECSTASY), URINE
MDA GC/MS confirm: NEGATIVE ng/mL (ref <200)
MDMA GC/MS Conf: NEGATIVE ng/mL (ref <200)

## 2014-06-12 LAB — OXYCODONE, URINE (LC/MS-MS)
NOROXYCODONE, UR: 2581 ng/mL (ref <50)
OXYCODONE, UR: 1989 ng/mL (ref <50)
OXYMORPHONE, URINE: 1493 ng/mL (ref <50)

## 2014-06-13 LAB — PRESCRIPTION MONITORING PROFILE (SOLSTAS)
Amphetamine/Meth: NEGATIVE ng/mL
BENZODIAZEPINE SCREEN, URINE: NEGATIVE ng/mL
BUPRENORPHINE, URINE: NEGATIVE ng/mL
Barbiturate Screen, Urine: NEGATIVE ng/mL
Cannabinoid Scrn, Ur: NEGATIVE ng/mL
Carisoprodol, Urine: NEGATIVE ng/mL
Cocaine Metabolites: NEGATIVE ng/mL
Creatinine, Urine: 93.92 mg/dL (ref >20.0)
FENTANYL URINE: NEGATIVE ng/mL
MEPERIDINE UR: NEGATIVE ng/mL
Methadone Screen, Urine: NEGATIVE ng/mL
Nitrites, Initial: NEGATIVE ug/mL
PROPOXYPHENE: NEGATIVE ng/mL
Tapentadol, urine: NEGATIVE ng/mL
Tramadol Scrn, Ur: NEGATIVE ng/mL
ZOLPIDEM, URINE: NEGATIVE ng/mL
pH, Initial: 5.2 pH (ref 4.5–8.9)

## 2014-06-25 NOTE — Progress Notes (Signed)
Urine drug screen for this encounter is consistent for prescribed medication 

## 2014-07-07 ENCOUNTER — Encounter: Payer: Self-pay | Admitting: Physical Medicine & Rehabilitation

## 2014-07-07 ENCOUNTER — Ambulatory Visit (HOSPITAL_BASED_OUTPATIENT_CLINIC_OR_DEPARTMENT_OTHER): Payer: Medicare Other | Admitting: Physical Medicine & Rehabilitation

## 2014-07-07 ENCOUNTER — Encounter: Payer: Medicare Other | Attending: Physical Medicine & Rehabilitation

## 2014-07-07 VITALS — BP 134/66 | HR 68 | Resp 14

## 2014-07-07 DIAGNOSIS — E1149 Type 2 diabetes mellitus with other diabetic neurological complication: Secondary | ICD-10-CM

## 2014-07-07 DIAGNOSIS — M5416 Radiculopathy, lumbar region: Secondary | ICD-10-CM

## 2014-07-07 DIAGNOSIS — E1140 Type 2 diabetes mellitus with diabetic neuropathy, unspecified: Secondary | ICD-10-CM | POA: Diagnosis not present

## 2014-07-07 DIAGNOSIS — Z760 Encounter for issue of repeat prescription: Secondary | ICD-10-CM | POA: Insufficient documentation

## 2014-07-07 MED ORDER — OXYCODONE HCL 15 MG PO TABS: 15.0000 mg | ORAL_TABLET | Freq: Three times a day (TID) | ORAL | Status: DC | PRN

## 2014-07-07 MED ORDER — ACETAMINOPHEN-CODEINE #4 300-60 MG PO TABS: 1.0000 | ORAL_TABLET | Freq: Three times a day (TID) | ORAL | Status: DC | PRN

## 2014-07-07 NOTE — Progress Notes (Signed)
Subjective:    Patient ID: Troy [name], male    DOB: [birth year], 68 y.o.   MRN: [id number]  HPI Seen by PCP, Hgb A1C down from 13 to 11, now 9.1 Patient remains active, doing yard work, no longer getting on ladders after our discussion last year  Drives,, independent with all self-care and mobility Pain control is adequate for his usual ADLs  Pain Inventory Average Pain 7 Pain Right Now 7 My pain is constant and aching  In the last 24 hours, has pain interfered with the following? General activity 7 Relation with others 7 Enjoyment of life 8 What TIME of day is your pain at its worst? daytime and evening Sleep (in general) Good  Pain is worse with: bending and sitting Pain improves with: rest and medication Relief from Meds: 7  Mobility walk without assistance how many minutes can you walk? 20 ability to climb steps?  yes do you drive?  yes  Function retired  Neuro/Psych bladder control problems depression  Prior Studies Any changes since last visit?  no  Physicians involved in your care Any changes since last visit?  no   Family History  Problem Relation Age of Onset  . Hypertension Mother   . Diabetes Mother   . Stroke Mother     multiple  . Hypertension Father   . Stroke Father   . Stroke Sister   . Stroke Daughter    History   Social History  . Marital Status: Married    Spouse Name: N/A  . Number of Children: N/A  . Years of Education: N/A   Social History Main Topics  . Smoking status: Never Smoker   . Smokeless tobacco: Never Used  . Alcohol Use: No  . Drug Use: No  . Sexual Activity: Not on file   Other Topics Concern  . None   Social History Narrative   Past Surgical History  Procedure Laterality Date  . Cardiac surgery  2003    stent  . Trigger finger release    . Implantation vagal nerve stimulator    . Vagal nerve stimulator removal    . Tumor removal  2002    S1 tumor removal by Dr. Annette Stable   Past Medical History    Diagnosis Date  . High blood pressure   . Diabetes mellitus   . Arthritis   . Plantar fasciitis    BP 134/66 mmHg  Pulse 68  Resp 14  SpO2 97%  Opioid Risk Score:   Fall Risk Score: Low Fall Risk (0-5 points)`1  Depression screen PHQ 2/9  Depression screen PHQ 2/9 05/12/2014  Decreased Interest 0  Down, Depressed, Hopeless 0  PHQ - 2 Score 0  Altered sleeping 0  Tired, decreased energy 0  Change in appetite 0  Feeling bad or failure about yourself  0  Trouble concentrating 0  Moving slowly or fidgety/restless 0  Suicidal thoughts 0  PHQ-9 Score 0     Review of Systems  Constitutional:       High blood sugar - 188 07/07/14  HENT: Negative.   Eyes: Negative.   Respiratory: Negative.        Sleep apnea with CPAP  Cardiovascular: Negative.   Gastrointestinal: Negative.   Endocrine: Negative.   Genitourinary: Positive for dysuria.       Bladder control problems  Musculoskeletal: Positive for arthralgias.  Skin: Negative.   Allergic/Immunologic: Negative.   Neurological: Negative.   Hematological: Negative.   Psychiatric/Behavioral: Positive  for dysphoric mood.       Objective:   Physical Exam  Constitutional: He is oriented to person, place, and time. He appears well-developed and well-nourished.  HENT:  Head: Normocephalic and atraumatic.  Neurological: He is alert and oriented to person, place, and time. He has normal strength. A sensory deficit is present. Gait normal.  Creased proprioception left great toe Decreased pinprick sensation right great toe right little toe as well as left great toe. Intact proprioception and pinprick at the ankle  Foot intrinsic atrophy bilaterally     Psychiatric: He has a normal mood and affect.  Nursing note and vitals reviewed.   Gait without toe drag or knee instability      Assessment & Plan:  1. 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   2. Diabetic peripheral neuropathy affecting both feet and  affecting balance.   Continue current medications oxycodone 15 mg 3 times a day this is being prescribed by our office   We discussed other treatment options especially since he drives from [place] on a monthly basis. We will trial Tylenol No. 4 one to 2 tablets 3 times per day for a couple days to see how that compares to his oxycodone. We'll discuss next month. Determine whether he can be switched Continue gabapentin 400 mg 4 times per day , prescribed by primary care

## 2014-07-07 NOTE — Patient Instructions (Signed)
Try Tylenol No. 4 with codeine in place of oxycodone for 3 days Take 2 of the Tylenol No. 4 with codeine 3 times per day

## 2014-08-11 ENCOUNTER — Encounter: Payer: Self-pay | Admitting: Physical Medicine & Rehabilitation

## 2014-08-11 ENCOUNTER — Ambulatory Visit (HOSPITAL_BASED_OUTPATIENT_CLINIC_OR_DEPARTMENT_OTHER): Payer: Medicare Other | Admitting: Physical Medicine & Rehabilitation

## 2014-08-11 ENCOUNTER — Encounter: Payer: Medicare Other | Attending: Physical Medicine & Rehabilitation

## 2014-08-11 VITALS — BP 140/70 | HR 68 | Resp 14

## 2014-08-11 DIAGNOSIS — Z760 Encounter for issue of repeat prescription: Secondary | ICD-10-CM | POA: Insufficient documentation

## 2014-08-11 DIAGNOSIS — E1140 Type 2 diabetes mellitus with diabetic neuropathy, unspecified: Secondary | ICD-10-CM | POA: Diagnosis not present

## 2014-08-11 DIAGNOSIS — E1149 Type 2 diabetes mellitus with other diabetic neurological complication: Principal | ICD-10-CM

## 2014-08-11 DIAGNOSIS — M5417 Radiculopathy, lumbosacral region: Secondary | ICD-10-CM

## 2014-08-11 MED ORDER — OXYCODONE HCL 15 MG PO TABS: 15.0000 mg | ORAL_TABLET | Freq: Three times a day (TID) | ORAL | Status: DC | PRN

## 2014-08-11 NOTE — Progress Notes (Signed)
Subjective:    Patient ID: Troy [name], male    DOB: Jun 11, 1945, 69 y.o.   MRN: [id number]  HPI Diabetic management still an issue will see his primary care physician to follow up some blood work on July 13. Independent with all his self-care and mobility. Doing some work around the house washing his car. Tried Tylenol No. 4 in place of oxycodone, this was not effective. Pain Inventory Average Pain 7 Pain Right Now 8 My pain is intermittent, dull and aching  In the last 24 hours, has pain interfered with the following? General activity 7 Relation with others 6 Enjoyment of life 8 What TIME of day is your pain at its worst? daytime Sleep (in general) Fair  Pain is worse with: bending and some activites Pain improves with: medication Relief from Meds: 5  Mobility walk without assistance how many minutes can you walk? 20-25 ability to climb steps?  yes do you drive?  yes  Function disabled: date disabled 04/2009  Neuro/Psych bladder control problems bowel control problems depression  Prior Studies Any changes since last visit?  no  Physicians involved in your care Any changes since last visit?  no   Family History  Problem Relation Age of Onset  . Hypertension Mother   . Diabetes Mother   . Stroke Mother     multiple  . Hypertension Father   . Stroke Father   . Stroke Sister   . Stroke Daughter    History   Social History  . Marital Status: Married    Spouse Name: N/A  . Number of Children: N/A  . Years of Education: N/A   Social History Main Topics  . Smoking status: Never Smoker   . Smokeless tobacco: Never Used  . Alcohol Use: No  . Drug Use: No  . Sexual Activity: Not on file   Other Topics Concern  . None   Social History Narrative   Past Surgical History  Procedure Laterality Date  . Cardiac surgery  2003    stent  . Trigger finger release    . Implantation vagal nerve stimulator    . Vagal nerve stimulator removal    . Tumor  removal  2002    S1 tumor removal by Dr. Annette Stable   Past Medical History  Diagnosis Date  . High blood pressure   . Diabetes mellitus   . Arthritis   . Plantar fasciitis    BP 140/70 mmHg  Pulse 68  Resp 14  SpO2 97%  Opioid Risk Score:   Fall Risk Score: Low Fall Risk (0-5 points)`1  Depression screen PHQ 2/9  Depression screen PHQ 2/9 05/12/2014  Decreased Interest 0  Down, Depressed, Hopeless 0  PHQ - 2 Score 0  Altered sleeping 0  Tired, decreased energy 0  Change in appetite 0  Feeling bad or failure about yourself  0  Trouble concentrating 0  Moving slowly or fidgety/restless 0  Suicidal thoughts 0  PHQ-9 Score 0     Review of Systems  Constitutional: Negative.        High blood sugar - 171 this am  HENT: Negative.   Eyes: Negative.   Respiratory:       Sleep apnea w/CPAP  Cardiovascular: Negative.   Gastrointestinal: Negative.        Bowel control problems  Endocrine: Negative.   Genitourinary: Positive for dysuria.       Bladder control problems  Musculoskeletal: Positive for myalgias, back pain and arthralgias.  Skin: Negative.   Allergic/Immunologic: Negative.   Neurological: Negative.   Hematological: Negative.   Psychiatric/Behavioral: Positive for dysphoric mood.       Objective:   Physical Exam  Constitutional: He is oriented to person, place, and time. He appears well-developed and well-nourished.  HENT:  Head: Normocephalic and atraumatic.  Eyes: Conjunctivae are normal. Pupils are equal, round, and reactive to light.  Hard of hearing  Neurological: He is alert and oriented to person, place, and time.  Psychiatric: Thought content normal. His affect is blunt. His speech is delayed. He is slowed.  Nursing note and vitals reviewed.   Intact sensation to pinprick in both upper and lower extremities. Decreased light touch sensation as well as proprioception below the knees in bilateral lower extremities Mild intrinsic atrophy bilateral  feet No evidence of skin breakdown in the feet Decreased single leg toe raise on the right side compared to the left side      Assessment & Plan:  1. 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   2. Diabetic peripheral neuropathy affecting both feet and affecting balance.  No evidence of skin breakdown, sharp touch sensation still intact but proprioception reduced as well as light touch. Continue current medications oxycodone 15 mg 3 times a day this is being prescribed by our office   \We  trialed Tylenol No. 4 , This was not effective, Other alternatives may include buprenorphine patch Although the dose of oxycodone is likely far in excess of opioid equivalence compared to Bu trans-patch even at the highest dose Continue gabapentin 400 mg 2 tabs 4 times per day , prescribed by primary care

## 2014-08-17 ENCOUNTER — Ambulatory Visit: Payer: Medicare Other | Admitting: Registered Nurse

## 2014-09-11 ENCOUNTER — Encounter: Payer: Medicare Other | Attending: Physical Medicine & Rehabilitation | Admitting: Registered Nurse

## 2014-09-11 ENCOUNTER — Encounter: Payer: Self-pay | Admitting: Registered Nurse

## 2014-09-11 VITALS — BP 133/57 | HR 64 | Resp 14

## 2014-09-11 DIAGNOSIS — Z823 Family history of stroke: Secondary | ICD-10-CM | POA: Insufficient documentation

## 2014-09-11 DIAGNOSIS — G894 Chronic pain syndrome: Secondary | ICD-10-CM | POA: Insufficient documentation

## 2014-09-11 DIAGNOSIS — Z760 Encounter for issue of repeat prescription: Secondary | ICD-10-CM | POA: Diagnosis not present

## 2014-09-11 DIAGNOSIS — E1149 Type 2 diabetes mellitus with other diabetic neurological complication: Secondary | ICD-10-CM

## 2014-09-11 DIAGNOSIS — M1990 Unspecified osteoarthritis, unspecified site: Secondary | ICD-10-CM | POA: Insufficient documentation

## 2014-09-11 DIAGNOSIS — Z5181 Encounter for therapeutic drug level monitoring: Secondary | ICD-10-CM

## 2014-09-11 DIAGNOSIS — Z833 Family history of diabetes mellitus: Secondary | ICD-10-CM | POA: Insufficient documentation

## 2014-09-11 DIAGNOSIS — Z8249 Family history of ischemic heart disease and other diseases of the circulatory system: Secondary | ICD-10-CM | POA: Insufficient documentation

## 2014-09-11 DIAGNOSIS — E1140 Type 2 diabetes mellitus with diabetic neuropathy, unspecified: Secondary | ICD-10-CM | POA: Insufficient documentation

## 2014-09-11 DIAGNOSIS — D3610 Benign neoplasm of peripheral nerves and autonomic nervous system, unspecified: Secondary | ICD-10-CM | POA: Insufficient documentation

## 2014-09-11 DIAGNOSIS — M79671 Pain in right foot: Secondary | ICD-10-CM | POA: Insufficient documentation

## 2014-09-11 DIAGNOSIS — Z79899 Other long term (current) drug therapy: Secondary | ICD-10-CM | POA: Diagnosis not present

## 2014-09-11 DIAGNOSIS — I10 Essential (primary) hypertension: Secondary | ICD-10-CM | POA: Insufficient documentation

## 2014-09-11 DIAGNOSIS — M5410 Radiculopathy, site unspecified: Secondary | ICD-10-CM | POA: Insufficient documentation

## 2014-09-11 MED ORDER — OXYCODONE HCL 15 MG PO TABS: 15.0000 mg | ORAL_TABLET | Freq: Three times a day (TID) | ORAL | Status: DC | PRN

## 2014-09-11 NOTE — Progress Notes (Signed)
Subjective:    Patient ID: Troy [name], male    DOB: 1945-06-01, 69 y.o.   MRN: [id number]  HPI: [name] is a 69 year old male who returns for follow up for chronic pain and medication refill. He says his pain is located in his right foot. He rates his pain 7. His current exercise regime is walking and yard work.   Pain Inventory Average Pain 7 Pain Right Now 7 My pain is aching  In the last 24 hours, has pain interfered with the following? General activity 8 Relation with others 7 Enjoyment of life 8 What TIME of day is your pain at its worst? daytime and evening Sleep (in general) Fair  Pain is worse with: bending Pain improves with: rest and medication Relief from Meds: 7  Mobility walk without assistance how many minutes can you walk? 20 ability to climb steps?  yes do you drive?  yes  Function retired  Neuro/Psych bladder control problems depression  Prior Studies Any changes since last visit?  no  Physicians involved in your care Any changes since last visit?  no   Family History  Problem Relation Age of Onset  . Hypertension Mother   . Diabetes Mother   . Stroke Mother     multiple  . Hypertension Father   . Stroke Father   . Stroke Sister   . Stroke Daughter    History   Social History  . Marital Status: Married    Spouse Name: N/A  . Number of Children: N/A  . Years of Education: N/A   Social History Main Topics  . Smoking status: Never Smoker   . Smokeless tobacco: Never Used  . Alcohol Use: No  . Drug Use: No  . Sexual Activity: Not on file   Other Topics Concern  . None   Social History Narrative   Past Surgical History  Procedure Laterality Date  . Cardiac surgery  2003    stent  . Trigger finger release    . Implantation vagal nerve stimulator    . Vagal nerve stimulator removal    . Tumor removal  2002    S1 tumor removal by Dr. Annette Stable   Past Medical History  Diagnosis Date  . High blood pressure   .  Diabetes mellitus   . Arthritis   . Plantar fasciitis    BP 133/57 mmHg  Pulse 64  Resp 14  SpO2 97%  Opioid Risk Score:   Fall Risk Score:  `1  Depression screen PHQ 2/9  Depression screen [facility] 2/9 09/11/2014 05/12/2014  Decreased Interest 0 0  Down, Depressed, Hopeless 0 0  PHQ - 2 Score 0 0  Altered sleeping - 0  Tired, decreased energy - 0  Change in appetite - 0  Feeling bad or failure about yourself  - 0  Trouble concentrating - 0  Moving slowly or fidgety/restless - 0  Suicidal thoughts - 0  PHQ-9 Score - 0    Review of Systems  Respiratory: Positive for apnea.   Endocrine:       High blood sugars  Genitourinary: Positive for difficulty urinating.  Psychiatric/Behavioral: Positive for dysphoric mood.  All other systems reviewed and are negative.      Objective:   Physical Exam  Constitutional: He is oriented to person, place, and time. He appears well-developed and well-nourished.  HENT:  Head: Normocephalic and atraumatic.  Neck: Normal range of motion. Neck supple.  Cardiovascular: Normal rate  and regular rhythm.   Pulmonary/Chest: Effort normal and breath sounds normal.  Musculoskeletal:  Normal Muscle Bulk and Muscle Testing Reveals: Upper Extremities: Full ROM and Muscle Strength 5/5 Lower extremities: Full ROM and muscle Strength 5/5 Arises from chair with ease Narrow based Gait  Neurological: He is alert and oriented to person, place, and time.  Skin: Skin is warm and dry.  Psychiatric: He has a normal mood and affect.  Nursing note and vitals reviewed.         Assessment & Plan:  1. 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  2. Diabetic peripheral neuropathy affecting both feet: Continue Gabapentin. Endocrinologist Following   15 minutes of face to face patient care time was spent during this visit. All questions were encouraged and answered.   F/U in 1  month

## 2014-10-13 ENCOUNTER — Encounter: Payer: Medicare Other | Attending: Physical Medicine & Rehabilitation | Admitting: Registered Nurse

## 2014-10-13 ENCOUNTER — Encounter: Payer: Self-pay | Admitting: Registered Nurse

## 2014-10-13 VITALS — BP 140/63 | HR 79

## 2014-10-13 DIAGNOSIS — Z760 Encounter for issue of repeat prescription: Secondary | ICD-10-CM | POA: Diagnosis not present

## 2014-10-13 DIAGNOSIS — Z79899 Other long term (current) drug therapy: Secondary | ICD-10-CM

## 2014-10-13 DIAGNOSIS — Z5181 Encounter for therapeutic drug level monitoring: Secondary | ICD-10-CM | POA: Diagnosis not present

## 2014-10-13 DIAGNOSIS — E1140 Type 2 diabetes mellitus with diabetic neuropathy, unspecified: Secondary | ICD-10-CM

## 2014-10-13 DIAGNOSIS — G894 Chronic pain syndrome: Secondary | ICD-10-CM

## 2014-10-13 DIAGNOSIS — E1149 Type 2 diabetes mellitus with other diabetic neurological complication: Secondary | ICD-10-CM

## 2014-10-13 MED ORDER — OXYCODONE HCL 15 MG PO TABS: 15.0000 mg | ORAL_TABLET | Freq: Three times a day (TID) | ORAL | Status: DC | PRN

## 2014-10-13 NOTE — Progress Notes (Signed)
Subjective:    Patient ID: Troy [name], male    DOB: 06-14-1945, 69 y.o.   MRN: [id number]  HPI: [name] is a 69 year old male who returns for follow up for chronic pain and medication refill. He says his pain is located in his right foot. He rates his pain 7. His current exercise regime is walking and yard work. Also states his PCP placed him on Seroquel for insomnia noticed increase dryness of the mouth encouraged to follow up with his PCP he verbalizes understanding.  Pain Inventory Average Pain 8 Pain Right Now 7 My pain is aching and other  In the last 24 hours, has pain interfered with the following? General activity 7 Relation with others 7 Enjoyment of life 8 What TIME of day is your pain at its worst? na Sleep (in general) NA  Pain is worse with: na Pain improves with: na Relief from Meds: 7  Mobility ability to climb steps?  yes do you drive?  yes Do you have any goals in this area?  yes  Function retired Do you have any goals in this area?  yes  Neuro/Psych bladder control problems depression  Prior Studies na  Physicians involved in your care na   Family History  Problem Relation Age of Onset  . Hypertension Mother   . Diabetes Mother   . Stroke Mother     multiple  . Hypertension Father   . Stroke Father   . Stroke Sister   . Stroke Daughter    Social History   Social History  . Marital Status: Married    Spouse Name: N/A  . Number of Children: N/A  . Years of Education: N/A   Social History Main Topics  . Smoking status: Never Smoker   . Smokeless tobacco: Never Used  . Alcohol Use: No  . Drug Use: No  . Sexual Activity: Not Asked   Other Topics Concern  . None   Social History Narrative   Past Surgical History  Procedure Laterality Date  . Cardiac surgery  2003    stent  . Trigger finger release    . Implantation vagal nerve stimulator    . Vagal nerve stimulator removal    . Tumor removal  2002    S1 tumor  removal by Dr. Annette Stable   Past Medical History  Diagnosis Date  . High blood pressure   . Diabetes mellitus   . Arthritis   . Plantar fasciitis    BP 140/63 mmHg  Pulse 79  SpO2 97%  Opioid Risk Score:   Fall Risk Score:  `1  Depression screen PHQ 2/9  Depression screen [facility] 2/9 10/13/2014 09/11/2014 05/12/2014  Decreased Interest 0 0 0  Down, Depressed, Hopeless 0 0 0  PHQ - 2 Score 0 0 0  Altered sleeping - - 0  Tired, decreased energy - - 0  Change in appetite - - 0  Feeling bad or failure about yourself  - - 0  Trouble concentrating - - 0  Moving slowly or fidgety/restless - - 0  Suicidal thoughts - - 0  PHQ-9 Score - - 0     Review of Systems  HENT:       High blood sugar  Respiratory: Positive for apnea.   All other systems reviewed and are negative.      Objective:   Physical Exam  Constitutional: He is oriented to person, place, and time. He appears well-developed and well-nourished.  HENT:  Head: Normocephalic and atraumatic.  Neck: Normal range of motion. Neck supple.  Cardiovascular: Normal rate and regular rhythm.   Pulmonary/Chest: Effort normal and breath sounds normal.  Musculoskeletal:  Normal Muscle Bulk and Muscle Testing Reveals: Upper Extremities: Full ROM and Muscle Strength 5/5 Lower Extremities: Full ROM and Muscle Strength 5/5 Arises from chair with ease Narrow Based Gait  Neurological: He is alert and oriented to person, place, and time.  Skin: Skin is warm and dry.  Psychiatric: He has a normal mood and affect.  Nursing note and vitals reviewed.         Assessment & Plan:  1. 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  2. Diabetic peripheral neuropathy affecting both feet: Continue Gabapentin. Endocrinologist Following   15 minutes of face to face patient care time was spent during this visit. All questions were encouraged and answered.   F/U in 1  month

## 2014-11-10 ENCOUNTER — Encounter: Payer: Self-pay | Admitting: Registered Nurse

## 2014-11-10 ENCOUNTER — Encounter: Payer: Medicare Other | Attending: Physical Medicine & Rehabilitation | Admitting: Registered Nurse

## 2014-11-10 VITALS — BP 125/59 | HR 75

## 2014-11-10 DIAGNOSIS — Z5181 Encounter for therapeutic drug level monitoring: Secondary | ICD-10-CM | POA: Diagnosis not present

## 2014-11-10 DIAGNOSIS — E1140 Type 2 diabetes mellitus with diabetic neuropathy, unspecified: Secondary | ICD-10-CM | POA: Diagnosis not present

## 2014-11-10 DIAGNOSIS — G894 Chronic pain syndrome: Secondary | ICD-10-CM

## 2014-11-10 DIAGNOSIS — Z760 Encounter for issue of repeat prescription: Secondary | ICD-10-CM | POA: Diagnosis not present

## 2014-11-10 DIAGNOSIS — E1149 Type 2 diabetes mellitus with other diabetic neurological complication: Secondary | ICD-10-CM

## 2014-11-10 DIAGNOSIS — Z79899 Other long term (current) drug therapy: Secondary | ICD-10-CM | POA: Diagnosis not present

## 2014-11-10 MED ORDER — OXYCODONE HCL 15 MG PO TABS: 15.0000 mg | ORAL_TABLET | Freq: Three times a day (TID) | ORAL | Status: DC | PRN

## 2014-11-10 NOTE — Progress Notes (Signed)
Subjective:    Patient ID: Troy [name], male    DOB: August 31, 1945, 69 y.o.   MRN: [id number]  HPI: [name] is a 69 year old male who returns for follow up for chronic pain and medication refill. He says his pain is located in his right foot. He rates his pain 8. His current exercise regime is walking and yard work.   Pain Inventory Average Pain 7 Pain Right Now 8 My pain is aching and other  In the last 24 hours, has pain interfered with the following? General activity 7 Relation with others 8 Enjoyment of life 7 What TIME of day is your pain at its worst? daytime, evening Sleep (in general) Fair  Pain is worse with: bending Pain improves with: rest and medication Relief from Meds: 7  Mobility how many minutes can you walk? 20 ability to climb steps?  yes do you drive?  yes Do you have any goals in this area?  yes  Function not employed: date last employed 2/31/11 retired Do you have any goals in this area?  yes  Neuro/Psych bladder control problems depression  Prior Studies na  Physicians involved in your care na   Family History  Problem Relation Age of Onset  . Hypertension Mother   . Diabetes Mother   . Stroke Mother     multiple  . Hypertension Father   . Stroke Father   . Stroke Sister   . Stroke Daughter    Social History   Social History  . Marital Status: Married    Spouse Name: N/A  . Number of Children: N/A  . Years of Education: N/A   Social History Main Topics  . Smoking status: Never Smoker   . Smokeless tobacco: Never Used  . Alcohol Use: No  . Drug Use: No  . Sexual Activity: Not Asked   Other Topics Concern  . None   Social History Narrative   Past Surgical History  Procedure Laterality Date  . Cardiac surgery  2003    stent  . Trigger finger release    . Implantation vagal nerve stimulator    . Vagal nerve stimulator removal    . Tumor removal  2002    S1 tumor removal by Dr. Annette Stable   Past Medical History   Diagnosis Date  . High blood pressure   . Diabetes mellitus   . Arthritis   . Plantar fasciitis    BP 125/59 mmHg  Pulse 75  SpO2 96%  Opioid Risk Score:   Fall Risk Score:  `1  Depression screen PHQ 2/9  Depression screen [facility] 2/9 11/10/2014 10/13/2014 09/11/2014 05/12/2014  Decreased Interest 0 0 0 0  Down, Depressed, Hopeless 0 0 0 0  PHQ - 2 Score 0 0 0 0  Altered sleeping - - - 0  Tired, decreased energy - - - 0  Change in appetite - - - 0  Feeling bad or failure about yourself  - - - 0  Trouble concentrating - - - 0  Moving slowly or fidgety/restless - - - 0  Suicidal thoughts - - - 0  PHQ-9 Score - - - 0     Review of Systems  HENT:       High blood sugar  Respiratory: Positive for apnea.   All other systems reviewed and are negative.      Objective:   Physical Exam  Constitutional: He is oriented to person, place, and time. He appears  well-developed and well-nourished.  HENT:  Head: Normocephalic and atraumatic.  Neck: Normal range of motion. Neck supple.  Cardiovascular: Normal rate and regular rhythm.   Pulmonary/Chest: Effort normal and breath sounds normal.  Musculoskeletal:  Normal Muscle Bulk and Muscle Testing Reveals: Upper Extremities: Full ROM and Muscle Strength 5/5 Lower Extremities: Full ROM and Muscle Strength 5/5 Arises from chair with ease Narrow Based Gait  Neurological: He is alert and oriented to person, place, and time.  Skin: Skin is warm and dry.  Psychiatric: He has a normal mood and affect.  Nursing note and vitals reviewed.         Assessment & Plan:  1. 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  2. Diabetic peripheral neuropathy affecting both feet: Continue Gabapentin. Endocrinologist Following   15 minutes of face to face patient care time was spent during this visit. All questions were encouraged and answered.   F/U in 1 month

## 2014-12-08 ENCOUNTER — Encounter: Payer: Medicare Other | Attending: Physical Medicine & Rehabilitation | Admitting: Registered Nurse

## 2014-12-08 ENCOUNTER — Encounter: Payer: Self-pay | Admitting: Registered Nurse

## 2014-12-08 VITALS — BP 122/64 | HR 78 | Resp 15

## 2014-12-08 DIAGNOSIS — Z760 Encounter for issue of repeat prescription: Secondary | ICD-10-CM | POA: Insufficient documentation

## 2014-12-08 DIAGNOSIS — E1140 Type 2 diabetes mellitus with diabetic neuropathy, unspecified: Secondary | ICD-10-CM

## 2014-12-08 DIAGNOSIS — G894 Chronic pain syndrome: Secondary | ICD-10-CM | POA: Diagnosis not present

## 2014-12-08 DIAGNOSIS — Z5181 Encounter for therapeutic drug level monitoring: Secondary | ICD-10-CM | POA: Diagnosis not present

## 2014-12-08 DIAGNOSIS — E1149 Type 2 diabetes mellitus with other diabetic neurological complication: Secondary | ICD-10-CM

## 2014-12-08 DIAGNOSIS — Z79899 Other long term (current) drug therapy: Secondary | ICD-10-CM

## 2014-12-08 MED ORDER — OXYCODONE HCL 15 MG PO TABS: 15.0000 mg | ORAL_TABLET | Freq: Three times a day (TID) | ORAL | Status: DC | PRN

## 2014-12-08 NOTE — Progress Notes (Signed)
Subjective:    Patient ID: Troy [name], male    DOB: 20-Oct-1945, 69 y.o.   MRN: [id number]  HPI: [name] is a 69 year old male who returns for follow up for chronic pain and medication refill. He says his pain is located in his right foot. He rates his pain 7. His current exercise regime is walking and yard work.   Pain Inventory Average Pain 7 Pain Right Now 7 My pain is dull and aching  In the last 24 hours, has pain interfered with the following? General activity 7 Relation with others 7 Enjoyment of life 8 What TIME of day is your pain at its worst? Daytime and Evening Sleep (in general) Fair  Pain is worse with: bending Pain improves with: rest and medication Relief from Meds: 7  Mobility how many minutes can you walk? 20 ability to climb steps?  yes do you drive?  yes Do you have any goals in this area?  yes  Function retired Do you have any goals in this area?  yes  Neuro/Psych bladder control problems depression  Prior Studies Any changes since last visit?  no  Physicians involved in your care Any changes since last visit?  no   Family History  Problem Relation Age of Onset  . Hypertension Mother   . Diabetes Mother   . Stroke Mother     multiple  . Hypertension Father   . Stroke Father   . Stroke Sister   . Stroke Daughter    Social History   Social History  . Marital Status: Married    Spouse Name: N/A  . Number of Children: N/A  . Years of Education: N/A   Social History Main Topics  . Smoking status: Never Smoker   . Smokeless tobacco: Never Used  . Alcohol Use: No  . Drug Use: No  . Sexual Activity: Not Asked   Other Topics Concern  . None   Social History Narrative   Past Surgical History  Procedure Laterality Date  . Cardiac surgery  2003    stent  . Trigger finger release    . Implantation vagal nerve stimulator    . Vagal nerve stimulator removal    . Tumor removal  2002    S1 tumor removal by Dr. Annette Stable     Past Medical History  Diagnosis Date  . High blood pressure   . Diabetes mellitus   . Arthritis   . Plantar fasciitis    BP 99/53 mmHg  Pulse 78  Resp 15  SpO2 98%  Opioid Risk Score:   Fall Risk Score:  `1  Depression screen PHQ 2/9  Depression screen [facility] 2/9 11/10/2014 10/13/2014 09/11/2014 05/12/2014  Decreased Interest 0 0 0 0  Down, Depressed, Hopeless 0 0 0 0  PHQ - 2 Score 0 0 0 0  Altered sleeping - - - 0  Tired, decreased energy - - - 0  Change in appetite - - - 0  Feeling bad or failure about yourself  - - - 0  Trouble concentrating - - - 0  Moving slowly or fidgety/restless - - - 0  Suicidal thoughts - - - 0  PHQ-9 Score - - - 0     Review of Systems  Respiratory: Positive for apnea.   Endocrine:       Diabetes  Genitourinary:       Bladder Control Problems  Psychiatric/Behavioral:       Depression  All other systems reviewed and are negative.      Objective:   Physical Exam  Constitutional: He is oriented to person, place, and time. He appears well-developed and well-nourished.  HENT:  Head: Normocephalic and atraumatic.  Neck: Normal range of motion. Neck supple.  Cardiovascular: Normal rate and regular rhythm.   Pulmonary/Chest: Effort normal and breath sounds normal.  Musculoskeletal:  Normal Muscle Bulk and Muscle Testing Reveals: Upper Extremities: Full ROM and Muscle Strength 5/5 Lower Extremities: Full ROM and Muscle Strength 5/5 Arises from chair with ease Narrow Based gait  Neurological: He is alert and oriented to person, place, and time.  Skin: Skin is warm and dry.  Psychiatric: He has a normal mood and affect.  Nursing note and vitals reviewed.         Assessment & Plan:  1. 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  2. Diabetic peripheral neuropathy affecting both feet: Continue Gabapentin. Endocrinologist Following   15 minutes of face  to face patient care time was spent during this visit. All questions were encouraged and answered.   F/U in 1 month

## 2015-01-05 ENCOUNTER — Other Ambulatory Visit: Payer: Self-pay | Admitting: Registered Nurse

## 2015-01-05 ENCOUNTER — Encounter: Payer: Medicare Other | Attending: Physical Medicine & Rehabilitation | Admitting: Registered Nurse

## 2015-01-05 ENCOUNTER — Encounter: Payer: Self-pay | Admitting: Registered Nurse

## 2015-01-05 VITALS — BP 116/52 | HR 65

## 2015-01-05 DIAGNOSIS — M5417 Radiculopathy, lumbosacral region: Secondary | ICD-10-CM | POA: Diagnosis not present

## 2015-01-05 DIAGNOSIS — G894 Chronic pain syndrome: Secondary | ICD-10-CM | POA: Diagnosis not present

## 2015-01-05 DIAGNOSIS — Z5181 Encounter for therapeutic drug level monitoring: Secondary | ICD-10-CM | POA: Diagnosis not present

## 2015-01-05 DIAGNOSIS — E1140 Type 2 diabetes mellitus with diabetic neuropathy, unspecified: Secondary | ICD-10-CM | POA: Diagnosis not present

## 2015-01-05 DIAGNOSIS — E1149 Type 2 diabetes mellitus with other diabetic neurological complication: Secondary | ICD-10-CM

## 2015-01-05 DIAGNOSIS — Z760 Encounter for issue of repeat prescription: Secondary | ICD-10-CM | POA: Insufficient documentation

## 2015-01-05 DIAGNOSIS — Z79899 Other long term (current) drug therapy: Secondary | ICD-10-CM

## 2015-01-05 MED ORDER — OXYCODONE HCL 15 MG PO TABS: 15.0000 mg | ORAL_TABLET | Freq: Three times a day (TID) | ORAL | Status: DC | PRN

## 2015-01-05 NOTE — Progress Notes (Signed)
Subjective:    Patient ID: Troy [name], male    DOB: 1945-03-08, 69 y.o.   MRN: [id number]  HPI: Mr. Troy SLEPPY is a 69 year old male who returns for follow up for chronic pain and medication refill. He says his pain is located in his right foot. He rates his pain 7. His current exercise regime is walking in the mall 10 minutes three times a week and yard work.   Pain Inventory Average Pain 7 Pain Right Now 7 My pain is intermittent and aching  In the last 24 hours, has pain interfered with the following? General activity 7 Relation with others 7 Enjoyment of life 8 What TIME of day is your pain at its worst? Morning and Evening Sleep (in general) NA  Pain is worse with: bending Pain improves with: medication Relief from Meds: 7  Mobility walk without assistance how many minutes can you walk? 20 ability to climb steps?  yes do you drive?  yes Do you have any goals in this area?  yes  Function retired Do you have any goals in this area?  yes  Neuro/Psych No problems in this area  Prior Studies Any changes since last visit?  no  Physicians involved in your care Any changes since last visit?  no   Family History  Problem Relation Age of Onset  . Hypertension Mother   . Diabetes Mother   . Stroke Mother     multiple  . Hypertension Father   . Stroke Father   . Stroke Sister   . Stroke Daughter    Social History   Social History  . Marital Status: Married    Spouse Name: N/A  . Number of Children: N/A  . Years of Education: N/A   Social History Main Topics  . Smoking status: Never Smoker   . Smokeless tobacco: Never Used  . Alcohol Use: No  . Drug Use: No  . Sexual Activity: Not Asked   Other Topics Concern  . None   Social History Narrative   Past Surgical History  Procedure Laterality Date  . Cardiac surgery  2003    stent  . Trigger finger release    . Implantation vagal nerve stimulator    . Vagal nerve stimulator removal    .  Tumor removal  2002    S1 tumor removal by Dr. Annette Stable   Past Medical History  Diagnosis Date  . High blood pressure   . Diabetes mellitus   . Arthritis   . Plantar fasciitis    BP 116/52 mmHg  Pulse 65  SpO2 97%  Opioid Risk Score:   Fall Risk Score:  `1  Depression screen PHQ 2/9  Depression screen [facility] 2/9 11/10/2014 10/13/2014 09/11/2014 05/12/2014  Decreased Interest 0 0 0 0  Down, Depressed, Hopeless 0 0 0 0  PHQ - 2 Score 0 0 0 0  Altered sleeping - - - 0  Tired, decreased energy - - - 0  Change in appetite - - - 0  Feeling bad or failure about yourself  - - - 0  Trouble concentrating - - - 0  Moving slowly or fidgety/restless - - - 0  Suicidal thoughts - - - 0  PHQ-9 Score - - - 0     Review of Systems  Respiratory: Positive for apnea.   Endocrine:       High Blood Sugar  Psychiatric/Behavioral:       Depression  All other  systems reviewed and are negative.      Objective:   Physical Exam  Constitutional: He is oriented to person, place, and time. He appears well-developed and well-nourished.  HENT:  Head: Normocephalic and atraumatic.  Neck: Normal range of motion. Neck supple.  Cardiovascular: Normal rate and regular rhythm.   Pulmonary/Chest: Effort normal and breath sounds normal.  Musculoskeletal:  Normal Muscle Bulk and Muscle Testing Reveals: Upper Extremities: Full ROM and Muscle Strength 5/5 Lower Extremities: Full ROM and Muscle Strength 5/5 Arises from chair with ease Narrow Based gait  Neurological: He is alert and oriented to person, place, and time.  Skin: Skin is warm and dry.  Psychiatric: He has a normal mood and affect.  Nursing note and vitals reviewed.         Assessment & Plan:  1. 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  2. Diabetic peripheral neuropathy affecting both feet: Continue Gabapentin. Endocrinologist Following   15 minutes of  face to face patient care time was spent during this visit. All questions were encouraged and answered.   F/U in 1 month

## 2015-01-06 LAB — PMP ALCOHOL METABOLITE (ETG): ETGU: NEGATIVE ng/mL

## 2015-01-09 LAB — OPIATES/OPIOIDS (LC/MS-MS)
CODEINE URINE: NEGATIVE ng/mL (ref <50)
HYDROCODONE: NEGATIVE ng/mL (ref <50)
Hydromorphone: NEGATIVE ng/mL (ref <50)
MORPHINE: NEGATIVE ng/mL (ref <50)
NORHYDROCODONE, UR: NEGATIVE ng/mL (ref <50)
NOROXYCODONE, UR: 8466 ng/mL (ref <50)
Oxycodone, ur: 6122 ng/mL (ref <50)
Oxymorphone: 3829 ng/mL (ref <50)

## 2015-01-09 LAB — MDMA (ECSTASY), URINE
MDA GC/MS confirm: NEGATIVE ng/mL (ref <200)
MDMA GC/MS Conf: NEGATIVE ng/mL (ref <200)

## 2015-01-09 LAB — OXYCODONE, URINE (LC/MS-MS)
Noroxycodone, Ur: 8466 ng/mL (ref <50)
OXYCODONE, UR: 6122 ng/mL (ref <50)
OXYMORPHONE, URINE: 3829 ng/mL (ref <50)

## 2015-01-12 LAB — PRESCRIPTION MONITORING PROFILE (SOLSTAS)
AMPHETAMINE/METH: NEGATIVE ng/mL
BENZODIAZEPINE SCREEN, URINE: NEGATIVE ng/mL
BUPRENORPHINE, URINE: NEGATIVE ng/mL
Barbiturate Screen, Urine: NEGATIVE ng/mL
CANNABINOID SCRN UR: NEGATIVE ng/mL
CREATININE, URINE: 185.83 mg/dL (ref >20.0)
Carisoprodol, Urine: NEGATIVE ng/mL
Cocaine Metabolites: NEGATIVE ng/mL
Fentanyl, Ur: NEGATIVE ng/mL
MEPERIDINE UR: NEGATIVE ng/mL
Methadone Screen, Urine: NEGATIVE ng/mL
NITRITES URINE, INITIAL: NEGATIVE ug/mL
PH URINE, INITIAL: 4.7 pH (ref 4.5–8.9)
Propoxyphene: NEGATIVE ng/mL
TAPENTADOLUR: NEGATIVE ng/mL
Tramadol Scrn, Ur: NEGATIVE ng/mL
ZOLPIDEM, URINE: NEGATIVE ng/mL

## 2015-02-09 ENCOUNTER — Encounter: Payer: Medicare Other | Attending: Physical Medicine & Rehabilitation | Admitting: Registered Nurse

## 2015-02-09 ENCOUNTER — Encounter: Payer: Self-pay | Admitting: Registered Nurse

## 2015-02-09 VITALS — BP 128/61 | HR 80 | Resp 14

## 2015-02-09 DIAGNOSIS — Z79899 Other long term (current) drug therapy: Secondary | ICD-10-CM

## 2015-02-09 DIAGNOSIS — Z760 Encounter for issue of repeat prescription: Secondary | ICD-10-CM | POA: Insufficient documentation

## 2015-02-09 DIAGNOSIS — G894 Chronic pain syndrome: Secondary | ICD-10-CM | POA: Diagnosis not present

## 2015-02-09 DIAGNOSIS — Z5181 Encounter for therapeutic drug level monitoring: Secondary | ICD-10-CM | POA: Diagnosis not present

## 2015-02-09 DIAGNOSIS — E1142 Type 2 diabetes mellitus with diabetic polyneuropathy: Secondary | ICD-10-CM

## 2015-02-09 DIAGNOSIS — M5417 Radiculopathy, lumbosacral region: Secondary | ICD-10-CM

## 2015-02-09 MED ORDER — OXYCODONE HCL 15 MG PO TABS: 15.0000 mg | ORAL_TABLET | Freq: Three times a day (TID) | ORAL | Status: DC | PRN

## 2015-02-09 NOTE — Progress Notes (Signed)
Subjective:    Patient ID: Troy [name], male    DOB: [birth year], 69 y.o.   MRN: [id number]  HPI: Troy [name] is a 69 year old male who returns for follow up for chronic pain and medication refill. He says his pain is located in his right foot. He rates his pain 7. His current exercise regime is walking in the mall 10 minutes three times a week.  Pain Inventory Average Pain 8 Pain Right Now 7 My pain is aching  In the last 24 hours, has pain interfered with the following? General activity 8 Relation with others 8 Enjoyment of life 8 What TIME of day is your pain at its worst? morning, evening Sleep (in general) Fair  Pain is worse with: walking and bending Pain improves with: rest and medication Relief from Meds: 8  Mobility walk without assistance how many minutes can you walk? 20 ability to climb steps?  yes do you drive?  yes Do you have any goals in this area?  yes  Function retired Do you have any goals in this area?  yes  Neuro/Psych depression  Prior Studies Any changes since last visit?  no  Physicians involved in your care Any changes since last visit?  no   Family History  Problem Relation Age of Onset  . Hypertension Mother   . Diabetes Mother   . Stroke Mother     multiple  . Hypertension Father   . Stroke Father   . Stroke Sister   . Stroke Daughter    Social History   Social History  . Marital Status: Married    Spouse Name: N/A  . Number of Children: N/A  . Years of Education: N/A   Social History Main Topics  . Smoking status: Never Smoker   . Smokeless tobacco: Never Used  . Alcohol Use: No  . Drug Use: No  . Sexual Activity: Not Asked   Other Topics Concern  . None   Social History Narrative   Past Surgical History  Procedure Laterality Date  . Cardiac surgery  2003    stent  . Trigger finger release    . Implantation vagal nerve stimulator    . Vagal nerve stimulator removal    . Tumor removal  2002    S1  tumor removal by Dr. Annette Stable   Past Medical History  Diagnosis Date  . High blood pressure   . Diabetes mellitus   . Arthritis   . Plantar fasciitis    BP 128/61 mmHg  Pulse 80  Resp 14  SpO2 94%  Opioid Risk Score:   Fall Risk Score:  `1  Depression screen PHQ 2/9  Depression screen [facility] 2/9 11/10/2014 10/13/2014 09/11/2014 05/12/2014  Decreased Interest 0 0 0 0  Down, Depressed, Hopeless 0 0 0 0  PHQ - 2 Score 0 0 0 0  Altered sleeping - - - 0  Tired, decreased energy - - - 0  Change in appetite - - - 0  Feeling bad or failure about yourself  - - - 0  Trouble concentrating - - - 0  Moving slowly or fidgety/restless - - - 0  Suicidal thoughts - - - 0  PHQ-9 Score - - - 0     Review of Systems  Respiratory: Positive for apnea.   Endocrine:       High blood sugar  Psychiatric/Behavioral: Positive for dysphoric mood.  All other systems reviewed and are negative.  Objective:   Physical Exam  Constitutional: He is oriented to person, place, and time. He appears well-developed and well-nourished.  HENT:  Head: Normocephalic and atraumatic.  Neck: Normal range of motion. Neck supple.  Cardiovascular: Normal rate and regular rhythm.   Pulmonary/Chest: Effort normal and breath sounds normal.  Musculoskeletal:  Normal Muscle Bulk and Muscle Testing Reveals: Upper Extremities: Full ROM and Muscle Strength 5/5 Lower Extremities: Full ROM and Muscle Strength 5/5 Arises from chair with ease Narrow Based Gait  Neurological: He is alert and oriented to person, place, and time.  Skin: Skin is warm and dry.  Psychiatric: He has a normal mood and affect.  Nursing note and vitals reviewed.         Assessment & Plan:  1. 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  2. Diabetic peripheral neuropathy affecting both feet: Continue Gabapentin. Endocrinologist Following   15 minutes of face to  face patient care time was spent during this visit. All questions were encouraged and answered.   F/U in 1 month

## 2015-03-09 ENCOUNTER — Ambulatory Visit (HOSPITAL_BASED_OUTPATIENT_CLINIC_OR_DEPARTMENT_OTHER): Payer: Medicare Other | Admitting: Physical Medicine & Rehabilitation

## 2015-03-09 ENCOUNTER — Encounter: Payer: Medicare Other | Attending: Physical Medicine & Rehabilitation

## 2015-03-09 ENCOUNTER — Encounter: Payer: Self-pay | Admitting: Physical Medicine & Rehabilitation

## 2015-03-09 VITALS — BP 119/62 | HR 68 | Resp 16

## 2015-03-09 DIAGNOSIS — M5417 Radiculopathy, lumbosacral region: Secondary | ICD-10-CM | POA: Diagnosis not present

## 2015-03-09 DIAGNOSIS — Z760 Encounter for issue of repeat prescription: Secondary | ICD-10-CM | POA: Insufficient documentation

## 2015-03-09 MED ORDER — OXYCODONE HCL 15 MG PO TABS: 15.0000 mg | ORAL_TABLET | Freq: Three times a day (TID) | ORAL | Status: DC | PRN

## 2015-03-09 NOTE — Progress Notes (Signed)
Subjective:    Patient ID: Troy [name], male    DOB: 12-06-1945, 70 y.o.   MRN: [id number]  HPI 70 year old male with chronic right lower extremity pain related to Right S1 nerve root schwannoma. Patient has tried nonnarcotic medications but did not have good relief. Has been well managed on oxycodone 15 mg 3 times per day. In addition patient has diabetic neuropathy and occasionally has left foot pain.  Interval medical history, patient has undergone cardiac catheterization in December 2016 which was reported as not showing any Blocked arteries. Do not have actual reports.This was done at the Hospital in [place]  Pain Inventory Average Pain 8 Pain Right Now 8 My pain is aching  In the last 24 hours, has pain interfered with the following? General activity 8 Relation with others 7 Enjoyment of life 8 What TIME of day is your pain at its worst? morning Sleep (in general) Good  Pain is worse with: walking and bending Pain improves with: rest and medication Relief from Meds: 7  Mobility walk without assistance how many minutes can you walk? 20 ability to climb steps?  yes do you drive?  yes Do you have any goals in this area?  yes  Function retired Do you have any goals in this area?  yes  Neuro/Psych depression  Prior Studies Any changes since last visit?  no  Physicians involved in your care Any changes since last visit?  no   Family History  Problem Relation Age of Onset  . Hypertension Mother   . Diabetes Mother   . Stroke Mother     multiple  . Hypertension Father   . Stroke Father   . Stroke Sister   . Stroke Daughter    Social History   Social History  . Marital Status: Married    Spouse Name: N/A  . Number of Children: N/A  . Years of Education: N/A   Social History Main Topics  . Smoking status: Never Smoker   . Smokeless tobacco: Never Used  . Alcohol Use: No  . Drug Use: No  . Sexual Activity: Not Asked   Other Topics Concern  .  None   Social History Narrative   Past Surgical History  Procedure Laterality Date  . Cardiac surgery  2003    stent  . Trigger finger release    . Implantation vagal nerve stimulator    . Vagal nerve stimulator removal    . Tumor removal  2002    S1 tumor removal by Dr. Annette Stable   Past Medical History  Diagnosis Date  . High blood pressure   . Diabetes mellitus   . Arthritis   . Plantar fasciitis    BP 119/62 mmHg  Pulse 68  Resp 16  SpO2 96%  Opioid Risk Score:   Fall Risk Score:  `1  Depression screen PHQ 2/9  Depression screen [facility] 2/9 11/10/2014 10/13/2014 09/11/2014 05/12/2014  Decreased Interest 0 0 0 0  Down, Depressed, Hopeless 0 0 0 0  PHQ - 2 Score 0 0 0 0  Altered sleeping - - - 0  Tired, decreased energy - - - 0  Change in appetite - - - 0  Feeling bad or failure about yourself  - - - 0  Trouble concentrating - - - 0  Moving slowly or fidgety/restless - - - 0  Suicidal thoughts - - - 0  PHQ-9 Score - - - 0     Review of Systems  Respiratory:  Positive for apnea.   Endocrine:       High blood sugar  Psychiatric/Behavioral: Positive for dysphoric mood.  All other systems reviewed and are negative.      Objective:   Physical Exam  Constitutional: He is oriented to person, place, and time. He appears well-developed and well-nourished.  HENT:  Head: Normocephalic.  HOH  Eyes: Conjunctivae and EOM are normal. Pupils are equal, round, and reactive to light.  Neck: Normal range of motion.  Neurological: He is alert and oriented to person, place, and time. He displays no atrophy. A sensory deficit is present. He exhibits normal muscle tone.  Reflex Scores:      Patellar reflexes are 2+ on the right side and 2+ on the left side.      Achilles reflexes are 0 on the right side and 1+ on the left side. Sedation reduced to pinprick in the right L5 and right S1 distribution  Able to do toe raise on single leg bilaterally while holding onto table Motor  strength 5/5 in the lower extremities  Psychiatric: He has a normal mood and affect.  Nursing note and vitals reviewed.         Assessment & Plan:  1. Chronic right S1 radiculopathy responsive to narcotic analgesic medications in combination with gabapentin. We have considered other medication such as C3's, did not have good results with Tylenol No. 4. Because of history of liver disease would not use buprenorphine  Patient is already on a high dose of gabapentin.Discussed possibility of adding Lyrica and reducing oxycodone. Patient is quite satisfied with his current regimen and has been on a stable dose of oxycodone for number of years. No signs of abuse or misuse. Continue opioid monitoring program. This consists of regular clinic visits, examinations, urine drug screen, pill counts as well as use of [place] controlled substance reporting System. Last UDS in November 2016 was appropriate  Nurse practitioner visit on a monthly basis, M.D. Follow-up once or twice a year

## 2015-04-02 ENCOUNTER — Encounter: Payer: Medicare Other | Admitting: Registered Nurse

## 2015-04-02 ENCOUNTER — Encounter: Payer: Self-pay | Admitting: Registered Nurse

## 2015-04-02 ENCOUNTER — Encounter: Payer: Medicare Other | Attending: Physical Medicine & Rehabilitation | Admitting: Registered Nurse

## 2015-04-02 VITALS — BP 153/58 | HR 76

## 2015-04-02 DIAGNOSIS — M79671 Pain in right foot: Secondary | ICD-10-CM | POA: Diagnosis present

## 2015-04-02 DIAGNOSIS — Z760 Encounter for issue of repeat prescription: Secondary | ICD-10-CM | POA: Diagnosis not present

## 2015-04-02 DIAGNOSIS — D3610 Benign neoplasm of peripheral nerves and autonomic nervous system, unspecified: Secondary | ICD-10-CM | POA: Insufficient documentation

## 2015-04-02 DIAGNOSIS — M5417 Radiculopathy, lumbosacral region: Secondary | ICD-10-CM

## 2015-04-02 DIAGNOSIS — M5418 Radiculopathy, sacral and sacrococcygeal region: Secondary | ICD-10-CM | POA: Diagnosis not present

## 2015-04-02 DIAGNOSIS — G894 Chronic pain syndrome: Secondary | ICD-10-CM

## 2015-04-02 DIAGNOSIS — G8929 Other chronic pain: Secondary | ICD-10-CM | POA: Insufficient documentation

## 2015-04-02 DIAGNOSIS — E1142 Type 2 diabetes mellitus with diabetic polyneuropathy: Secondary | ICD-10-CM | POA: Diagnosis not present

## 2015-04-02 DIAGNOSIS — E1342 Other specified diabetes mellitus with diabetic polyneuropathy: Secondary | ICD-10-CM | POA: Insufficient documentation

## 2015-04-02 DIAGNOSIS — Z5181 Encounter for therapeutic drug level monitoring: Secondary | ICD-10-CM

## 2015-04-02 DIAGNOSIS — Z79899 Other long term (current) drug therapy: Secondary | ICD-10-CM

## 2015-04-02 MED ORDER — OXYCODONE HCL 15 MG PO TABS: 15.0000 mg | ORAL_TABLET | Freq: Three times a day (TID) | ORAL | Status: DC | PRN

## 2015-04-02 NOTE — Progress Notes (Signed)
Subjective:    Patient ID: Troy [name], male    DOB: 01-18-1946, 70 y.o.   MRN: [id number]  HPI: [name] is a 70 year old male who returns for follow up for chronic pain and medication refill. He says his pain is located in his right foot. He rates his pain 8. His current exercise regime is walking.  Pain Inventory Average Pain 8 Pain Right Now 8 My pain is aching  In the last 24 hours, has pain interfered with the following? General activity 7 Relation with others 7 Enjoyment of life 8 What TIME of day is your pain at its worst? daytime Sleep (in general) Fair  Pain is worse with: bending Pain improves with: rest and medication Relief from Meds: na  Mobility how many minutes can you walk? 20 ability to climb steps?  yes do you drive?  yes  Function retired  Neuro/Psych depression  Prior Studies Any changes since last visit?  no  Physicians involved in your care Any changes since last visit?  no   Family History  Problem Relation Age of Onset  . Hypertension Mother   . Diabetes Mother   . Stroke Mother     multiple  . Hypertension Father   . Stroke Father   . Stroke Sister   . Stroke Daughter    Social History   Social History  . Marital Status: Married    Spouse Name: N/A  . Number of Children: N/A  . Years of Education: N/A   Social History Main Topics  . Smoking status: Never Smoker   . Smokeless tobacco: Never Used  . Alcohol Use: No  . Drug Use: No  . Sexual Activity: Not Asked   Other Topics Concern  . None   Social History Narrative   Past Surgical History  Procedure Laterality Date  . Cardiac surgery  2003    stent  . Trigger finger release    . Implantation vagal nerve stimulator    . Vagal nerve stimulator removal    . Tumor removal  2002    S1 tumor removal by Dr. Annette Stable   Past Medical History  Diagnosis Date  . High blood pressure   . Diabetes mellitus   . Arthritis   . Plantar fasciitis    BP 153/58 mmHg   Pulse 76  SpO2 95%  Opioid Risk Score:   Fall Risk Score:  `1  Depression screen PHQ 2/9  Depression screen [facility] 2/9 04/02/2015 11/10/2014 10/13/2014 09/11/2014 05/12/2014  Decreased Interest 0 0 0 0 0  Down, Depressed, Hopeless 0 0 0 0 0  PHQ - 2 Score 0 0 0 0 0  Altered sleeping - - - - 0  Tired, decreased energy - - - - 0  Change in appetite - - - - 0  Feeling bad or failure about yourself  - - - - 0  Trouble concentrating - - - - 0  Moving slowly or fidgety/restless - - - - 0  Suicidal thoughts - - - - 0  PHQ-9 Score - - - - 0    Review of Systems  Respiratory: Positive for apnea.   Endocrine:       High blood sugar  All other systems reviewed and are negative.      Objective:   Physical Exam  Constitutional: He is oriented to person, place, and time. He appears well-developed and well-nourished.  HENT:  Head: Normocephalic and atraumatic.  Neck: Normal  range of motion. Neck supple.  Cardiovascular: Normal rate and regular rhythm.   Pulmonary/Chest: Effort normal and breath sounds normal.  Musculoskeletal:  Normal Muscle Bulk and Muscle Testing Reveals: Upper Extremities: Full ROM and Muscle Strength 5/5 Lower Extremities: Full ROM and Muscle Strength 5/5 Arises from chair with ease Narrow Based Gait  Neurological: He is alert and oriented to person, place, and time.  Skin: Skin is warm and dry.  Psychiatric: He has a normal mood and affect.  Nursing note and vitals reviewed.         Assessment & Plan:  1. 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  2. Diabetic peripheral neuropathy affecting both feet: Continue Gabapentin.  15 minutes of face to face patient care time was spent during this visit. All questions were encouraged and answered.   F/U in 1 month

## 2015-04-06 ENCOUNTER — Ambulatory Visit: Payer: Medicare Other | Admitting: Registered Nurse

## 2015-05-07 ENCOUNTER — Encounter: Payer: Self-pay | Admitting: Registered Nurse

## 2015-05-07 ENCOUNTER — Encounter: Payer: Medicare Other | Attending: Physical Medicine & Rehabilitation | Admitting: Registered Nurse

## 2015-05-07 VITALS — BP 158/62 | HR 68 | Resp 14

## 2015-05-07 DIAGNOSIS — G8929 Other chronic pain: Secondary | ICD-10-CM | POA: Diagnosis not present

## 2015-05-07 DIAGNOSIS — Z760 Encounter for issue of repeat prescription: Secondary | ICD-10-CM | POA: Insufficient documentation

## 2015-05-07 DIAGNOSIS — G894 Chronic pain syndrome: Secondary | ICD-10-CM

## 2015-05-07 DIAGNOSIS — D3610 Benign neoplasm of peripheral nerves and autonomic nervous system, unspecified: Secondary | ICD-10-CM | POA: Insufficient documentation

## 2015-05-07 DIAGNOSIS — M79671 Pain in right foot: Secondary | ICD-10-CM | POA: Diagnosis present

## 2015-05-07 DIAGNOSIS — Z79899 Other long term (current) drug therapy: Secondary | ICD-10-CM

## 2015-05-07 DIAGNOSIS — M5418 Radiculopathy, sacral and sacrococcygeal region: Secondary | ICD-10-CM | POA: Diagnosis not present

## 2015-05-07 DIAGNOSIS — M5417 Radiculopathy, lumbosacral region: Secondary | ICD-10-CM | POA: Diagnosis not present

## 2015-05-07 DIAGNOSIS — Z5181 Encounter for therapeutic drug level monitoring: Secondary | ICD-10-CM

## 2015-05-07 DIAGNOSIS — E1142 Type 2 diabetes mellitus with diabetic polyneuropathy: Secondary | ICD-10-CM

## 2015-05-07 DIAGNOSIS — E1342 Other specified diabetes mellitus with diabetic polyneuropathy: Secondary | ICD-10-CM | POA: Diagnosis not present

## 2015-05-07 MED ORDER — OXYCODONE HCL 15 MG PO TABS: 15.0000 mg | ORAL_TABLET | Freq: Three times a day (TID) | ORAL | Status: DC | PRN

## 2015-05-07 NOTE — Progress Notes (Signed)
Subjective:    Patient ID: Troy [name], male    DOB: 10-29-1945, 70 y.o.   MRN: [id number]  HPI: [name] is a 70 year old male who returns for follow up for chronic pain and medication refill. He states his pain is located in his right foot and occasionally lower back with over exertion. Encouraged to do household and yardwork in intervals, he verbalizes understanding.He rates his pain 8. His current exercise regime is walking.  Pain Inventory Average Pain 7 Pain Right Now 8 My pain is dull and aching  In the last 24 hours, has pain interfered with the following? General activity 7 Relation with others 8 Enjoyment of life 8 What TIME of day is your pain at its worst? morning, evening Sleep (in general) NA  Pain is worse with: bending Pain improves with: rest and medication Relief from Meds: 8  Mobility how many minutes can you walk? 20 ability to climb steps?  yes do you drive?  yes Do you have any goals in this area?  yes  Function retired Do you have any goals in this area?  yes  Neuro/Psych depression  Prior Studies Any changes since last visit?  no  Physicians involved in your care Any changes since last visit?  no   Family History  Problem Relation Age of Onset  . Hypertension Mother   . Diabetes Mother   . Stroke Mother     multiple  . Hypertension Father   . Stroke Father   . Stroke Sister   . Stroke Daughter    Social History   Social History  . Marital Status: Married    Spouse Name: N/A  . Number of Children: N/A  . Years of Education: N/A   Social History Main Topics  . Smoking status: Never Smoker   . Smokeless tobacco: Never Used  . Alcohol Use: No  . Drug Use: No  . Sexual Activity: Not Asked   Other Topics Concern  . None   Social History Narrative   Past Surgical History  Procedure Laterality Date  . Cardiac surgery  2003    stent  . Trigger finger release    . Implantation vagal nerve stimulator    . Vagal  nerve stimulator removal    . Tumor removal  2002    S1 tumor removal by Dr. Annette Stable   Past Medical History  Diagnosis Date  . High blood pressure   . Diabetes mellitus   . Arthritis   . Plantar fasciitis    BP 158/62 mmHg  Pulse 68  Resp 14  SpO2 95%  Opioid Risk Score:   Fall Risk Score:  `1  Depression screen PHQ 2/9  Depression screen [facility] 2/9 04/02/2015 11/10/2014 10/13/2014 09/11/2014 05/12/2014  Decreased Interest 0 0 0 0 0  Down, Depressed, Hopeless 0 0 0 0 0  PHQ - 2 Score 0 0 0 0 0  Altered sleeping - - - - 0  Tired, decreased energy - - - - 0  Change in appetite - - - - 0  Feeling bad or failure about yourself  - - - - 0  Trouble concentrating - - - - 0  Moving slowly or fidgety/restless - - - - 0  Suicidal thoughts - - - - 0  PHQ-9 Score - - - - 0       Review of Systems  Respiratory: Positive for apnea.   Endocrine:  High blood sugar   Psychiatric/Behavioral: Positive for dysphoric mood.  All other systems reviewed and are negative.      Objective:   Physical Exam  Constitutional: He is oriented to person, place, and time. He appears well-developed and well-nourished.  HENT:  Head: Normocephalic and atraumatic.  Neck: Normal range of motion. Neck supple.  Cardiovascular: Normal rate and regular rhythm.   Pulmonary/Chest: Effort normal and breath sounds normal.  Musculoskeletal:  Normal Muscle Bulk and Muscle Testing Reveals: Upper Extremities: Full ROM and Muscle Strength 5/5 Lower Extremities: Full ROM and Muscle Strength 5/5 Arises from chair with ease Narrow Based Gait  Neurological: He is alert and oriented to person, place, and time.  Skin: Skin is warm and dry.  Psychiatric: He has a normal mood and affect.  Nursing note and vitals reviewed.         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  2. Diabetic peripheral neuropathy  affecting both feet: Continue Gabapentin.  15 minutes of face to face patient care time was spent during this visit. All questions were encouraged and answered.   F/U in 1 month

## 2015-06-02 ENCOUNTER — Encounter: Payer: Medicare Other | Attending: Physical Medicine & Rehabilitation | Admitting: Registered Nurse

## 2015-06-02 ENCOUNTER — Encounter: Payer: Self-pay | Admitting: Registered Nurse

## 2015-06-02 VITALS — BP 135/54 | HR 60 | Resp 14

## 2015-06-02 DIAGNOSIS — Z5181 Encounter for therapeutic drug level monitoring: Secondary | ICD-10-CM | POA: Diagnosis not present

## 2015-06-02 DIAGNOSIS — Z760 Encounter for issue of repeat prescription: Secondary | ICD-10-CM | POA: Insufficient documentation

## 2015-06-02 DIAGNOSIS — E1142 Type 2 diabetes mellitus with diabetic polyneuropathy: Secondary | ICD-10-CM

## 2015-06-02 DIAGNOSIS — M5418 Radiculopathy, sacral and sacrococcygeal region: Secondary | ICD-10-CM | POA: Insufficient documentation

## 2015-06-02 DIAGNOSIS — G894 Chronic pain syndrome: Secondary | ICD-10-CM

## 2015-06-02 DIAGNOSIS — M5417 Radiculopathy, lumbosacral region: Secondary | ICD-10-CM | POA: Diagnosis not present

## 2015-06-02 DIAGNOSIS — M79671 Pain in right foot: Secondary | ICD-10-CM | POA: Diagnosis present

## 2015-06-02 DIAGNOSIS — E1342 Other specified diabetes mellitus with diabetic polyneuropathy: Secondary | ICD-10-CM | POA: Insufficient documentation

## 2015-06-02 DIAGNOSIS — G8929 Other chronic pain: Secondary | ICD-10-CM | POA: Insufficient documentation

## 2015-06-02 DIAGNOSIS — Z79899 Other long term (current) drug therapy: Secondary | ICD-10-CM | POA: Diagnosis not present

## 2015-06-02 DIAGNOSIS — D3610 Benign neoplasm of peripheral nerves and autonomic nervous system, unspecified: Secondary | ICD-10-CM | POA: Diagnosis not present

## 2015-06-02 MED ORDER — OXYCODONE HCL 15 MG PO TABS: 15.0000 mg | ORAL_TABLET | Freq: Three times a day (TID) | ORAL | Status: DC | PRN

## 2015-06-02 NOTE — Progress Notes (Signed)
Subjective:    Patient ID: Troy [name], male    DOB: [birth year], 70 y.o.   MRN: [id number]  HPI: Troy [name] is a 70 year old male who returns for follow up for chronic pain and medication refill. He states his pain is located in his right foot and occasionally lower back with activity. Current exercise regime is household chores and yardwork.He rates his pain 9. His current exercise regime is walking.  Pain Inventory Average Pain 9 Pain Right Now 9 My pain is constant and aching  In the last 24 hours, has pain interfered with the following? General activity 9 Relation with others 9 Enjoyment of life 9 What TIME of day is your pain at its worst? daytime Sleep (in general) Good  Pain is worse with: bending and some activites Pain improves with: rest and medication Relief from Meds: 8  Mobility walk without assistance how many minutes can you walk? 20 ability to climb steps?  yes do you drive?  yes Do you have any goals in this area?  yes  Function retired Do you have any goals in this area?  yes  Neuro/Psych numbness depression  Prior Studies Any changes since last visit?  no  Physicians involved in your care Any changes since last visit?  no   Family History  Problem Relation Age of Onset  . Hypertension Mother   . Diabetes Mother   . Stroke Mother     multiple  . Hypertension Father   . Stroke Father   . Stroke Sister   . Stroke Daughter    Social History   Social History  . Marital Status: Married    Spouse Name: N/A  . Number of Children: N/A  . Years of Education: N/A   Social History Main Topics  . Smoking status: Never Smoker   . Smokeless tobacco: Never Used  . Alcohol Use: No  . Drug Use: No  . Sexual Activity: Not Asked   Other Topics Concern  . None   Social History Narrative   Past Surgical History  Procedure Laterality Date  . Cardiac surgery  2003    stent  . Trigger finger release    . Implantation vagal nerve  stimulator    . Vagal nerve stimulator removal    . Tumor removal  2002    S1 tumor removal by Dr. Annette Stable   Past Medical History  Diagnosis Date  . High blood pressure   . Diabetes mellitus   . Arthritis   . Plantar fasciitis    BP 135/54 mmHg  Pulse 56  Resp 14  SpO2 96%  Opioid Risk Score:   Fall Risk Score:  `1  Depression screen PHQ 2/9  Depression screen [facility] 2/9 04/02/2015 11/10/2014 10/13/2014 09/11/2014 05/12/2014  Decreased Interest 0 0 0 0 0  Down, Depressed, Hopeless 0 0 0 0 0  PHQ - 2 Score 0 0 0 0 0  Altered sleeping - - - - 0  Tired, decreased energy - - - - 0  Change in appetite - - - - 0  Feeling bad or failure about yourself  - - - - 0  Trouble concentrating - - - - 0  Moving slowly or fidgety/restless - - - - 0  Suicidal thoughts - - - - 0  PHQ-9 Score - - - - 0     Review of Systems  Respiratory: Positive for apnea.   Endocrine:  High blood sugar  All other systems reviewed and are negative.      Objective:   Physical Exam  Constitutional: He is oriented to person, place, and time. He appears well-developed and well-nourished.  HENT:  Head: Normocephalic and atraumatic.  Neck: Normal range of motion. Neck supple.  Cardiovascular: Normal rate and regular rhythm.   Pulmonary/Chest: Effort normal and breath sounds normal.  Musculoskeletal:  Normal Muscle Bulk and Muscle Testing Reveals: Upper Extremities: Full ROM and Muscle Strength 5/5 Back without spinal tenderness Lower Extremities: Full ROM and Muscle Strength 5/5 Arises from chair with ease Narrow Based Gait  Neurological: He is alert and oriented to person, place, and time.  Skin: Skin is warm and dry.  Psychiatric: He has a normal mood and affect.  Nursing note and vitals reviewed.         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  We will continue the opioid monitoring  program, this consists of regular clinic visits, examinations, urine drug screen, pill counts as well as use of [place] Controlled Substance Reporting System. 2. Diabetic peripheral neuropathy affecting both feet: Continue Gabapentin.  15 minutes of face to face patient care time was spent during this visit. All questions were encouraged and answered.   F/U in 1 month

## 2015-06-08 LAB — TOXASSURE SELECT,+ANTIDEPR,UR: PDF: 0

## 2015-06-10 NOTE — Progress Notes (Signed)
Urine drug screen for this encounter is consistent for prescribed medication 

## 2015-07-07 ENCOUNTER — Encounter: Payer: Medicare Other | Attending: Physical Medicine & Rehabilitation | Admitting: Registered Nurse

## 2015-07-07 ENCOUNTER — Encounter: Payer: Self-pay | Admitting: Registered Nurse

## 2015-07-07 VITALS — BP 135/67 | HR 61 | Resp 16

## 2015-07-07 DIAGNOSIS — Z5181 Encounter for therapeutic drug level monitoring: Secondary | ICD-10-CM

## 2015-07-07 DIAGNOSIS — M5418 Radiculopathy, sacral and sacrococcygeal region: Secondary | ICD-10-CM | POA: Diagnosis not present

## 2015-07-07 DIAGNOSIS — D3610 Benign neoplasm of peripheral nerves and autonomic nervous system, unspecified: Secondary | ICD-10-CM | POA: Insufficient documentation

## 2015-07-07 DIAGNOSIS — E1142 Type 2 diabetes mellitus with diabetic polyneuropathy: Secondary | ICD-10-CM | POA: Diagnosis not present

## 2015-07-07 DIAGNOSIS — M5416 Radiculopathy, lumbar region: Secondary | ICD-10-CM | POA: Diagnosis not present

## 2015-07-07 DIAGNOSIS — M79671 Pain in right foot: Secondary | ICD-10-CM | POA: Diagnosis present

## 2015-07-07 DIAGNOSIS — E1342 Other specified diabetes mellitus with diabetic polyneuropathy: Secondary | ICD-10-CM | POA: Insufficient documentation

## 2015-07-07 DIAGNOSIS — Z760 Encounter for issue of repeat prescription: Secondary | ICD-10-CM | POA: Insufficient documentation

## 2015-07-07 DIAGNOSIS — G8929 Other chronic pain: Secondary | ICD-10-CM | POA: Diagnosis not present

## 2015-07-07 DIAGNOSIS — Z79899 Other long term (current) drug therapy: Secondary | ICD-10-CM

## 2015-07-07 DIAGNOSIS — G894 Chronic pain syndrome: Secondary | ICD-10-CM

## 2015-07-07 MED ORDER — OXYCODONE HCL 15 MG PO TABS: 15.0000 mg | ORAL_TABLET | Freq: Three times a day (TID) | ORAL | Status: DC | PRN

## 2015-07-07 NOTE — Progress Notes (Signed)
Subjective:    Patient ID: Troy [name], male    DOB: April 18, 1945, 70 y.o.   MRN: [id number]  HPI: [name] is a 70 year old male who returns for follow up for chronic pain and medication refill. He states his pain is located in his right foot and occasionally lower back with activity. Current exercise regime is household chores and yardwork.He rates his pain 9. His current exercise regime is walking. Also states yesterday he was sitting in a chair outside, when the chair gave way, he fell backward and hit his head against the fence. He has an abrasion on his scalp.   Pain Inventory Average Pain 9 Pain Right Now 9 My pain is aching  In the last 24 hours, has pain interfered with the following? General activity 8 Relation with others 9 Enjoyment of life 7 What TIME of day is your pain at its worst? Morning and Daytime Sleep (in general) Fair  Pain is worse with: bending and some activites Pain improves with: rest and medication Relief from Meds: NA  Mobility how many minutes can you walk? 15 ability to climb steps?  yes do you drive?  yes Do you have any goals in this area?  yes  Function not employed: date last employed 05/20/2009 retired Do you have any goals in this area?  yes  Neuro/Psych numbness depression  Prior Studies Any changes since last visit?  no  Physicians involved in your care Any changes since last visit?  no   Family History  Problem Relation Age of Onset  . Hypertension Mother   . Diabetes Mother   . Stroke Mother     multiple  . Hypertension Father   . Stroke Father   . Stroke Sister   . Stroke Daughter    Social History   Social History  . Marital Status: Married    Spouse Name: N/A  . Number of Children: N/A  . Years of Education: N/A   Social History Main Topics  . Smoking status: Never Smoker   . Smokeless tobacco: Never Used  . Alcohol Use: No  . Drug Use: No  . Sexual Activity: Not Asked   Other Topics Concern   . None   Social History Narrative   Past Surgical History  Procedure Laterality Date  . Cardiac surgery  2003    stent  . Trigger finger release    . Implantation vagal nerve stimulator    . Vagal nerve stimulator removal    . Tumor removal  2002    S1 tumor removal by Dr. Annette Stable   Past Medical History  Diagnosis Date  . High blood pressure   . Diabetes mellitus   . Arthritis   . Plantar fasciitis    BP 135/67 mmHg  Pulse 61  Resp 16  SpO2 95%  Opioid Risk Score:   Fall Risk Score:  `1  Depression screen PHQ 2/9  Depression screen [facility] 2/9 04/02/2015 11/10/2014 10/13/2014 09/11/2014 05/12/2014  Decreased Interest 0 0 0 0 0  Down, Depressed, Hopeless 0 0 0 0 0  PHQ - 2 Score 0 0 0 0 0  Altered sleeping - - - - 0  Tired, decreased energy - - - - 0  Change in appetite - - - - 0  Feeling bad or failure about yourself  - - - - 0  Trouble concentrating - - - - 0  Moving slowly or fidgety/restless - - - - 0  Suicidal thoughts - - - - 0  PHQ-9 Score - - - - 0      Review of Systems  Constitutional: Positive for unexpected weight change.  Respiratory: Positive for apnea.   Endocrine:       High Blood Sugar  Neurological: Positive for numbness.  Psychiatric/Behavioral:       Depression  All other systems reviewed and are negative.      Objective:   Physical Exam  Constitutional: He is oriented to person, place, and time. He appears well-developed and well-nourished.  HENT:  Head: Normocephalic and atraumatic.  Neck: Normal range of motion. Neck supple.  Cardiovascular: Normal rate and regular rhythm.   Pulmonary/Chest: Effort normal and breath sounds normal.  Musculoskeletal:  Normal Muscle Bulk and Muscle Testing Reveals: Upper Extremities: Full ROM and Muscle Strength 5/5 Back without tenderness noted Lower Extremities: Full ROM and Muscle Strength 5/5 Arises from chair with ease Narrow Based Gait   Neurological: He is alert and oriented to person, place,  and time.  Skin: Skin is warm and dry.  Psychiatric: He has a normal mood and affect.  Nursing note and vitals reviewed.         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  We will continue the opioid monitoring program, this consists of regular clinic visits, examinations, urine drug screen, pill counts as well as use of [place] Controlled Substance Reporting System. 2. Diabetic peripheral neuropathy affecting both feet: Continue Gabapentin.  15 minutes of face to face patient care time was spent during this visit. All questions were encouraged and answered.   F/U in 1 month

## 2015-08-10 ENCOUNTER — Ambulatory Visit: Payer: Medicare Other | Admitting: Registered Nurse

## 2015-08-11 ENCOUNTER — Encounter: Payer: Medicare Other | Attending: Physical Medicine & Rehabilitation | Admitting: Registered Nurse

## 2015-08-11 ENCOUNTER — Encounter: Payer: Self-pay | Admitting: Registered Nurse

## 2015-08-11 VITALS — BP 149/72 | HR 74

## 2015-08-11 DIAGNOSIS — E1142 Type 2 diabetes mellitus with diabetic polyneuropathy: Secondary | ICD-10-CM

## 2015-08-11 DIAGNOSIS — M5418 Radiculopathy, sacral and sacrococcygeal region: Secondary | ICD-10-CM | POA: Insufficient documentation

## 2015-08-11 DIAGNOSIS — M79671 Pain in right foot: Secondary | ICD-10-CM | POA: Insufficient documentation

## 2015-08-11 DIAGNOSIS — M5416 Radiculopathy, lumbar region: Secondary | ICD-10-CM | POA: Diagnosis not present

## 2015-08-11 DIAGNOSIS — G894 Chronic pain syndrome: Secondary | ICD-10-CM | POA: Diagnosis not present

## 2015-08-11 DIAGNOSIS — Z760 Encounter for issue of repeat prescription: Secondary | ICD-10-CM | POA: Diagnosis not present

## 2015-08-11 DIAGNOSIS — Z5181 Encounter for therapeutic drug level monitoring: Secondary | ICD-10-CM

## 2015-08-11 DIAGNOSIS — E1342 Other specified diabetes mellitus with diabetic polyneuropathy: Secondary | ICD-10-CM | POA: Insufficient documentation

## 2015-08-11 DIAGNOSIS — G8929 Other chronic pain: Secondary | ICD-10-CM | POA: Insufficient documentation

## 2015-08-11 DIAGNOSIS — Z79899 Other long term (current) drug therapy: Secondary | ICD-10-CM

## 2015-08-11 DIAGNOSIS — D3610 Benign neoplasm of peripheral nerves and autonomic nervous system, unspecified: Secondary | ICD-10-CM | POA: Diagnosis not present

## 2015-08-11 MED ORDER — OXYCODONE HCL 15 MG PO TABS: 15.0000 mg | ORAL_TABLET | Freq: Three times a day (TID) | ORAL | Status: DC | PRN

## 2015-08-11 NOTE — Progress Notes (Signed)
Subjective:    Patient ID: Troy [name], male    DOB: 1945-12-12, 70 y.o.   MRN: [id number]  HPI: [name] is a 70 year old male who returns for follow up for chronic pain and medication refill. He states his pain is located in his right foot and occasionally lower back with activity. Current exercise regime is household chores and yardwork.He rates his pain 9. His current exercise regime is walking.   Pain Inventory Average Pain 9 Pain Right Now 9 My pain is constant and aching  In the last 24 hours, has pain interfered with the following? General activity 8 Relation with others 9 Enjoyment of life 9 What TIME of day is your pain at its worst? morning, evening Sleep (in general) Fair  Pain is worse with: walking and bending Pain improves with: rest and medication Relief from Meds: 7  Mobility how many minutes can you walk? half an hour  Function retired Do you have any goals in this area?  yes  Neuro/Psych numbness trouble walking  Prior Studies .  Physicians involved in your care .   Family History  Problem Relation Age of Onset  . Hypertension Mother   . Diabetes Mother   . Stroke Mother     multiple  . Hypertension Father   . Stroke Father   . Stroke Sister   . Stroke Daughter    Social History   Social History  . Marital Status: Married    Spouse Name: N/A  . Number of Children: N/A  . Years of Education: N/A   Social History Main Topics  . Smoking status: Never Smoker   . Smokeless tobacco: Never Used  . Alcohol Use: No  . Drug Use: No  . Sexual Activity: Not Asked   Other Topics Concern  . None   Social History Narrative   Past Surgical History  Procedure Laterality Date  . Cardiac surgery  2003    stent  . Trigger finger release    . Implantation vagal nerve stimulator    . Vagal nerve stimulator removal    . Tumor removal  2002    S1 tumor removal by Dr. Annette Stable   Past Medical History  Diagnosis Date  . High blood  pressure   . Diabetes mellitus   . Arthritis   . Plantar fasciitis    BP 149/72 mmHg  Pulse 74  SpO2 95%  Opioid Risk Score:   Fall Risk Score:  `1  Depression screen PHQ 2/9  Depression screen [facility] 2/9 04/02/2015 11/10/2014 10/13/2014 09/11/2014 05/12/2014  Decreased Interest 0 0 0 0 0  Down, Depressed, Hopeless 0 0 0 0 0  PHQ - 2 Score 0 0 0 0 0  Altered sleeping - - - - 0  Tired, decreased energy - - - - 0  Change in appetite - - - - 0  Feeling bad or failure about yourself  - - - - 0  Trouble concentrating - - - - 0  Moving slowly or fidgety/restless - - - - 0  Suicidal thoughts - - - - 0  PHQ-9 Score - - - - 0    Review of Systems  Constitutional: Negative.   HENT: Negative.   Eyes: Negative.   Respiratory: Negative.   Cardiovascular: Negative.   Gastrointestinal: Negative.   Endocrine: Negative.   Genitourinary: Negative.   Musculoskeletal: Negative.   Skin: Negative.   Allergic/Immunologic: Negative.   Neurological: Negative.   Hematological:  Negative.   Psychiatric/Behavioral: Negative.   All other systems reviewed and are negative.      Objective:   Physical Exam  Constitutional: He is oriented to person, place, and time. He appears well-developed and well-nourished.  HENT:  Head: Normocephalic and atraumatic.  Neck: Normal range of motion. Neck supple.  Cardiovascular: Normal rate and regular rhythm.   Pulmonary/Chest: Effort normal and breath sounds normal.  Musculoskeletal:  Normal Muscle Bulk and Muscle Testing Reveals: Upper Extremities: Full ROM and Muscle Strength 5/5 Lower Extremities: Full ROM and Muscle Strength 5/5 Arises from chair with ease Narrow Based Gait  Neurological: He is alert and oriented to person, place, and time.  Skin: Skin is warm and dry.  Psychiatric: He has a normal mood and affect.  Nursing note and vitals reviewed.         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  We will continue the opioid monitoring program, this consists of regular clinic visits, examinations, urine drug screen, pill counts as well as use of [place] Controlled Substance Reporting System. 2. Diabetic peripheral neuropathy affecting both feet: Continue Gabapentin.  15 minutes of face to face patient care time was spent during this visit. All questions were encouraged and answered.

## 2015-09-09 ENCOUNTER — Encounter: Payer: Medicare Other | Attending: Physical Medicine & Rehabilitation | Admitting: Registered Nurse

## 2015-09-09 ENCOUNTER — Encounter: Payer: Self-pay | Admitting: Registered Nurse

## 2015-09-09 VITALS — BP 107/58 | HR 67 | Resp 14

## 2015-09-09 DIAGNOSIS — G894 Chronic pain syndrome: Secondary | ICD-10-CM | POA: Diagnosis not present

## 2015-09-09 DIAGNOSIS — E1142 Type 2 diabetes mellitus with diabetic polyneuropathy: Secondary | ICD-10-CM

## 2015-09-09 DIAGNOSIS — Z760 Encounter for issue of repeat prescription: Secondary | ICD-10-CM | POA: Diagnosis not present

## 2015-09-09 DIAGNOSIS — E1342 Other specified diabetes mellitus with diabetic polyneuropathy: Secondary | ICD-10-CM | POA: Insufficient documentation

## 2015-09-09 DIAGNOSIS — D3610 Benign neoplasm of peripheral nerves and autonomic nervous system, unspecified: Secondary | ICD-10-CM | POA: Diagnosis not present

## 2015-09-09 DIAGNOSIS — Z5181 Encounter for therapeutic drug level monitoring: Secondary | ICD-10-CM

## 2015-09-09 DIAGNOSIS — M5418 Radiculopathy, sacral and sacrococcygeal region: Secondary | ICD-10-CM | POA: Insufficient documentation

## 2015-09-09 DIAGNOSIS — Z79899 Other long term (current) drug therapy: Secondary | ICD-10-CM | POA: Diagnosis not present

## 2015-09-09 DIAGNOSIS — G8929 Other chronic pain: Secondary | ICD-10-CM | POA: Diagnosis not present

## 2015-09-09 DIAGNOSIS — M79671 Pain in right foot: Secondary | ICD-10-CM | POA: Diagnosis present

## 2015-09-09 MED ORDER — OXYCODONE HCL 15 MG PO TABS: 15.0000 mg | ORAL_TABLET | Freq: Three times a day (TID) | ORAL | Status: DC | PRN

## 2015-09-09 NOTE — Progress Notes (Signed)
Subjective:    Patient ID: Troy [name], male    DOB: [birth year], 70 y.o.   MRN: [id number]  HPI: Mr. ALWYN WIMP is a 70 year old male who returns for follow up for chronic pain and medication refill. He states his pain is located in his right foot and occasionally lower back with activity. Current exercise regime is household chores and yardwork.He rates his pain 9. His current exercise regime is walking.   Pain Inventory Average Pain 8 Pain Right Now 9 My pain is burning and aching  In the last 24 hours, has pain interfered with the following? General activity 8 Relation with others 8 Enjoyment of life 7 What TIME of day is your pain at its worst? daytime, evening, night Sleep (in general) Fair  Pain is worse with: bending Pain improves with: rest and medication Relief from Meds: 8  Mobility walk without assistance how many minutes can you walk? 20 ability to climb steps?  yes do you drive?  yes Do you have any goals in this area?  yes  Function retired Do you have any goals in this area?  yes  Neuro/Psych numbness depression  Prior Studies Any changes since last visit?  no  Physicians involved in your care Any changes since last visit?  no   Family History  Problem Relation Age of Onset  . Hypertension Mother   . Diabetes Mother   . Stroke Mother     multiple  . Hypertension Father   . Stroke Father   . Stroke Sister   . Stroke Daughter    Social History   Social History  . Marital Status: Married    Spouse Name: N/A  . Number of Children: N/A  . Years of Education: N/A   Social History Main Topics  . Smoking status: Never Smoker   . Smokeless tobacco: Never Used  . Alcohol Use: No  . Drug Use: No  . Sexual Activity: Not Asked   Other Topics Concern  . None   Social History Narrative   Past Surgical History  Procedure Laterality Date  . Cardiac surgery  2003    stent  . Trigger finger release    . Implantation vagal nerve  stimulator    . Vagal nerve stimulator removal    . Tumor removal  2002    S1 tumor removal by Dr. Annette Stable   Past Medical History  Diagnosis Date  . High blood pressure   . Diabetes mellitus   . Arthritis   . Plantar fasciitis    BP 93/57 mmHg  Pulse 68  Resp 14  SpO2 97%  Opioid Risk Score:   Fall Risk Score:  `1  Depression screen PHQ 2/9  Depression screen [facility] 2/9 04/02/2015 11/10/2014 10/13/2014 09/11/2014 05/12/2014  Decreased Interest 0 0 0 0 0  Down, Depressed, Hopeless 0 0 0 0 0  PHQ - 2 Score 0 0 0 0 0  Altered sleeping - - - - 0  Tired, decreased energy - - - - 0  Change in appetite - - - - 0  Feeling bad or failure about yourself  - - - - 0  Trouble concentrating - - - - 0  Moving slowly or fidgety/restless - - - - 0  Suicidal thoughts - - - - 0  PHQ-9 Score - - - - 0     Review of Systems  Constitutional: Negative.   HENT: Negative.   Eyes: Negative.   Respiratory:  Positive for apnea.   Cardiovascular: Negative.   Gastrointestinal: Negative.   Endocrine: Negative.        High blood sugar  Genitourinary: Negative.   Musculoskeletal: Negative.   Skin: Negative.   Allergic/Immunologic: Negative.   Neurological: Positive for numbness. Negative for tremors.  Hematological: Negative.   Psychiatric/Behavioral: Positive for dysphoric mood.  All other systems reviewed and are negative.      Objective:   Physical Exam  Constitutional: He is oriented to person, place, and time. He appears well-developed and well-nourished.  HENT:  Head: Normocephalic and atraumatic.  Neck: Normal range of motion. Neck supple.  Cardiovascular: Normal rate and regular rhythm.   Pulmonary/Chest: Effort normal and breath sounds normal.  Musculoskeletal:  Normal Muscle Bulk and Muscle Testing Reveals: Upper Extremities: Full ROM and Muscle Strength 5/5 Lower Extremities: Full ROM and Muscle Strength 5/5 Arises from Table with ease Narrow Based Gait  Neurological: He is alert  and oriented to person, place, and time.  Skin: Skin is warm and dry.  Psychiatric: He has a normal mood and affect.  Nursing note and vitals reviewed.         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  We will continue the opioid monitoring program, this consists of regular clinic visits, examinations, urine drug screen, pill counts as well as use of [place] Controlled Substance Reporting System. 2. Diabetic peripheral neuropathy affecting both feet: Continue Gabapentin.  15 minutes of face to face patient care time was spent during this visit. All questions were encouraged and answered.

## 2015-10-05 ENCOUNTER — Encounter: Payer: Self-pay | Admitting: Registered Nurse

## 2015-10-05 ENCOUNTER — Encounter: Payer: Medicare Other | Attending: Physical Medicine & Rehabilitation | Admitting: Registered Nurse

## 2015-10-05 VITALS — BP 110/69 | HR 82 | Resp 14

## 2015-10-05 DIAGNOSIS — E1142 Type 2 diabetes mellitus with diabetic polyneuropathy: Secondary | ICD-10-CM

## 2015-10-05 DIAGNOSIS — Z5181 Encounter for therapeutic drug level monitoring: Secondary | ICD-10-CM

## 2015-10-05 DIAGNOSIS — E1342 Other specified diabetes mellitus with diabetic polyneuropathy: Secondary | ICD-10-CM | POA: Insufficient documentation

## 2015-10-05 DIAGNOSIS — G894 Chronic pain syndrome: Secondary | ICD-10-CM

## 2015-10-05 DIAGNOSIS — M79671 Pain in right foot: Secondary | ICD-10-CM | POA: Diagnosis present

## 2015-10-05 DIAGNOSIS — Z760 Encounter for issue of repeat prescription: Secondary | ICD-10-CM | POA: Diagnosis not present

## 2015-10-05 DIAGNOSIS — Z79899 Other long term (current) drug therapy: Secondary | ICD-10-CM

## 2015-10-05 DIAGNOSIS — M5418 Radiculopathy, sacral and sacrococcygeal region: Secondary | ICD-10-CM | POA: Diagnosis not present

## 2015-10-05 DIAGNOSIS — D3610 Benign neoplasm of peripheral nerves and autonomic nervous system, unspecified: Secondary | ICD-10-CM | POA: Insufficient documentation

## 2015-10-05 DIAGNOSIS — G8929 Other chronic pain: Secondary | ICD-10-CM | POA: Insufficient documentation

## 2015-10-05 MED ORDER — OXYCODONE HCL 15 MG PO TABS: 15.0000 mg | ORAL_TABLET | Freq: Three times a day (TID) | ORAL | 0 refills | Status: DC | PRN

## 2015-10-05 NOTE — Progress Notes (Signed)
Subjective:    Patient ID: Troy [name], male    DOB: 11-04-1945, 70 y.o.   MRN: [id number]  HPI: Troy [name] is a 70 year old male who returns for follow up for chronic pain and medication refill. He states his pain is located in his right foot and occasionally lower back with activity. Current exercise regime is household chores and yardwork.He rates his pain 8. His current exercise regime is walking. Also states a few weeks ago he was at a ball game at the [facility] he lost his balanced and landed on his right side. Neighbor helped him up and he followed up with his PCP he states.    Pain Inventory Average Pain 8 Pain Right Now 8 My pain is constant and aching  In the last 24 hours, has pain interfered with the following? General activity 8 Relation with others 7 Enjoyment of life 7 What TIME of day is your pain at its worst? daytime, evening, night Sleep (in general) Fair  Pain is worse with: bending Pain improves with: rest and medication Relief from Meds: 8  Mobility walk without assistance ability to climb steps?  yes do you drive?  yes Do you have any goals in this area?  yes  Function retired Do you have any goals in this area?  yes  Neuro/Psych depression  Prior Studies Any changes since last visit?  no  Physicians involved in your care Any changes since last visit?  no   Family History  Problem Relation Age of Onset  . Hypertension Mother   . Diabetes Mother   . Stroke Mother     multiple  . Hypertension Father   . Stroke Father   . Stroke Sister   . Stroke Daughter    Social History   Social History  . Marital status: Married    Spouse name: N/A  . Number of children: N/A  . Years of education: N/A   Social History Main Topics  . Smoking status: Never Smoker  . Smokeless tobacco: Never Used  . Alcohol use No  . Drug use: No  . Sexual activity: Not Asked   Other Topics Concern  . None   Social History Narrative  .  None   Past Surgical History:  Procedure Laterality Date  . CARDIAC SURGERY  2003   stent  . IMPLANTATION VAGAL NERVE STIMULATOR    . TRIGGER FINGER RELEASE    . TUMOR REMOVAL  2002   S1 tumor removal by Dr. Annette Stable  . VAGAL NERVE STIMULATOR REMOVAL     Past Medical History:  Diagnosis Date  . Arthritis   . Diabetes mellitus   . High blood pressure   . Plantar fasciitis    BP 110/69 (BP Location: Left Arm, Patient Position: Sitting, Cuff Size: Normal)   Pulse 82   Resp 14   SpO2 97%   Opioid Risk Score:   Fall Risk Score:  `1  Depression screen PHQ 2/9  Depression screen [facility] 2/9 04/02/2015 11/10/2014 10/13/2014 09/11/2014 05/12/2014  Decreased Interest 0 0 0 0 0  Down, Depressed, Hopeless 0 0 0 0 0  PHQ - 2 Score 0 0 0 0 0  Altered sleeping - - - - 0  Tired, decreased energy - - - - 0  Change in appetite - - - - 0  Feeling bad or failure about yourself  - - - - 0  Trouble concentrating - - - - 0  Moving  slowly or fidgety/restless - - - - 0  Suicidal thoughts - - - - 0  PHQ-9 Score - - - - 0    Review of Systems  Constitutional: Negative.   HENT: Negative.   Eyes: Negative.   Respiratory: Positive for apnea.   Cardiovascular: Negative.   Gastrointestinal: Negative.   Endocrine: Negative.        High blood sugar  Genitourinary: Negative.   Musculoskeletal: Negative.   Skin: Negative.   Allergic/Immunologic: Negative.   Neurological: Negative.   Hematological: Negative.   Psychiatric/Behavioral: Positive for dysphoric mood.  All other systems reviewed and are negative.      Objective:   Physical Exam  Constitutional: He is oriented to person, place, and time. He appears well-developed and well-nourished.  HENT:  Head: Normocephalic and atraumatic.  Neck: Normal range of motion. Neck supple.  Cardiovascular: Normal rate and regular rhythm.   Pulmonary/Chest: Effort normal and breath sounds normal.  Musculoskeletal:  Normal Muscle Bulk and Muscle Testing  Reveals: Upper Extremities: Full ROM and Muscle Strength 5/5 Back without spinal tenderness Lower Extremities: Full ROM and Muscle Strength 5/5 Arises from table with ease Narrow based Gait  Neurological: He is alert and oriented to person, place, and time.  Skin: Skin is warm and dry.  Psychiatric: He has a normal mood and affect.  Nursing note and vitals reviewed.         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  We will continue the opioid monitoring program, this consists of regular clinic visits, examinations, urine drug screen, pill counts as well as use of [place] Controlled Substance Reporting System. 2. Diabetic peripheral neuropathy affecting both feet: Continue Gabapentin.  15 minutes of face to face patient care time was spent during this visit. All questions were encouraged and answered.

## 2015-10-11 ENCOUNTER — Ambulatory Visit: Payer: Medicare Other | Admitting: Registered Nurse

## 2015-11-08 ENCOUNTER — Encounter: Payer: Medicare Other | Attending: Physical Medicine & Rehabilitation | Admitting: Registered Nurse

## 2015-11-08 ENCOUNTER — Encounter: Payer: Self-pay | Admitting: Registered Nurse

## 2015-11-08 VITALS — BP 141/69 | HR 72

## 2015-11-08 DIAGNOSIS — E1142 Type 2 diabetes mellitus with diabetic polyneuropathy: Secondary | ICD-10-CM

## 2015-11-08 DIAGNOSIS — M5418 Radiculopathy, sacral and sacrococcygeal region: Secondary | ICD-10-CM | POA: Diagnosis not present

## 2015-11-08 DIAGNOSIS — G8929 Other chronic pain: Secondary | ICD-10-CM | POA: Diagnosis not present

## 2015-11-08 DIAGNOSIS — Z5181 Encounter for therapeutic drug level monitoring: Secondary | ICD-10-CM | POA: Diagnosis not present

## 2015-11-08 DIAGNOSIS — E1342 Other specified diabetes mellitus with diabetic polyneuropathy: Secondary | ICD-10-CM | POA: Insufficient documentation

## 2015-11-08 DIAGNOSIS — Z760 Encounter for issue of repeat prescription: Secondary | ICD-10-CM | POA: Diagnosis not present

## 2015-11-08 DIAGNOSIS — G894 Chronic pain syndrome: Secondary | ICD-10-CM

## 2015-11-08 DIAGNOSIS — Z79899 Other long term (current) drug therapy: Secondary | ICD-10-CM | POA: Diagnosis not present

## 2015-11-08 DIAGNOSIS — D3610 Benign neoplasm of peripheral nerves and autonomic nervous system, unspecified: Secondary | ICD-10-CM | POA: Insufficient documentation

## 2015-11-08 DIAGNOSIS — M79671 Pain in right foot: Secondary | ICD-10-CM | POA: Diagnosis present

## 2015-11-08 MED ORDER — OXYCODONE HCL 15 MG PO TABS: 15.0000 mg | ORAL_TABLET | Freq: Three times a day (TID) | ORAL | 0 refills | Status: DC | PRN

## 2015-11-08 NOTE — Progress Notes (Signed)
Subjective:    Patient ID: Troy [name], male    DOB: [birth year], 70 y.o.   MRN: [id number]  HPI: [name] is a 70 year old male who returns for follow up for chronic pain and medication refill. He states his pain is located in his right foot and occasionally lower back with activity. Current exercise regime is household chores, yardwork.He rates his pain 8. His current exercise regime is walking.  Pain Inventory Average Pain 9 Pain Right Now 8 My pain is aching  In the last 24 hours, has pain interfered with the following? General activity 8 Relation with others 7 Enjoyment of life 7 What TIME of day is your pain at its worst? evening, morning Sleep (in general) Fair  Pain is worse with: bending and some activites Pain improves with: rest and medication Relief from Meds: 8  Mobility walk without assistance how many minutes can you walk? 20 ability to climb steps?  yes do you drive?  yes Do you have any goals in this area?  yes  Function retired Do you have any goals in this area?  yes  Neuro/Psych numbness depression  Prior Studies .  Physicians involved in your care .   Family History  Problem Relation Age of Onset  . Hypertension Mother   . Diabetes Mother   . Stroke Mother     multiple  . Hypertension Father   . Stroke Father   . Stroke Sister   . Stroke Daughter    Social History   Social History  . Marital status: Married    Spouse name: N/A  . Number of children: N/A  . Years of education: N/A   Social History Main Topics  . Smoking status: Never Smoker  . Smokeless tobacco: Never Used  . Alcohol use No  . Drug use: No  . Sexual activity: Not Asked   Other Topics Concern  . None   Social History Narrative  . None   Past Surgical History:  Procedure Laterality Date  . CARDIAC SURGERY  2003   stent  . IMPLANTATION VAGAL NERVE STIMULATOR    . TRIGGER FINGER RELEASE    . TUMOR REMOVAL  2002   S1 tumor removal by Dr.  Annette [name]  . VAGAL NERVE STIMULATOR REMOVAL     Past Medical History:  Diagnosis Date  . Arthritis   . Diabetes mellitus   . High blood pressure   . Plantar fasciitis    BP (!) 141/69   Pulse 72   SpO2 98%   Opioid Risk Score:   Fall Risk Score:  `1  Depression screen PHQ 2/9  Depression screen [facility] 2/9 04/02/2015 11/10/2014 10/13/2014 09/11/2014 05/12/2014  Decreased Interest 0 0 0 0 0  Down, Depressed, Hopeless 0 0 0 0 0  PHQ - 2 Score 0 0 0 0 0  Altered sleeping - - - - 0  Tired, decreased energy - - - - 0  Change in appetite - - - - 0  Feeling bad or failure about yourself  - - - - 0  Trouble concentrating - - - - 0  Moving slowly or fidgety/restless - - - - 0  Suicidal thoughts - - - - 0  PHQ-9 Score - - - - 0    Review of Systems  Constitutional: Negative.   HENT: Negative.   Eyes: Negative.   Respiratory: Negative.   Cardiovascular: Negative.   Gastrointestinal: Negative.   Endocrine: Negative.  Genitourinary: Negative.   Musculoskeletal: Negative.   Skin: Negative.   Allergic/Immunologic: Negative.   Neurological: Positive for numbness.  Hematological: Negative.   Psychiatric/Behavioral: Positive for dysphoric mood.       Objective:   Physical Exam  Constitutional: He is oriented to person, place, and time. He appears well-developed and well-nourished.  HENT:  Head: Normocephalic and atraumatic.  Neck: Normal range of motion. Neck supple.  Cardiovascular: Normal rate and regular rhythm.   Pulmonary/Chest: Effort normal and breath sounds normal.  Musculoskeletal:  Normal Muscle Bulk and Muscle Testing Reveals:  Upper Extremities: Full ROM and Muscle Strength 5/5 Lower Extremities: Full ROM and Muscle Strength 5/5 Arises from table with ease Narrow Based Gait  Neurological: He is alert and oriented to person, place, and time.  Skin: Skin is warm and dry.  Psychiatric: He has a normal mood and affect.  Nursing note and vitals reviewed.           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  We will continue the opioid monitoring program, this consists of regular clinic visits, examinations, urine drug screen, pill counts as well as use of [place] Controlled Substance Reporting System. 2. Diabetic peripheral neuropathy affecting both feet: Continue Gabapentin.  15 minutes of face to face patient care time was spent during this visit. All questions were encouraged and answered.

## 2015-11-14 LAB — TOXASSURE SELECT,+ANTIDEPR,UR

## 2015-11-22 NOTE — Progress Notes (Signed)
Urine drug screen for this encounter is consistent for prescribed medication 

## 2015-12-06 ENCOUNTER — Encounter: Payer: Medicare Other | Attending: Physical Medicine & Rehabilitation

## 2015-12-06 ENCOUNTER — Encounter: Payer: Self-pay | Admitting: Physical Medicine & Rehabilitation

## 2015-12-06 ENCOUNTER — Ambulatory Visit (HOSPITAL_BASED_OUTPATIENT_CLINIC_OR_DEPARTMENT_OTHER): Payer: Medicare Other | Admitting: Physical Medicine & Rehabilitation

## 2015-12-06 VITALS — BP 121/72 | HR 70 | Resp 14

## 2015-12-06 DIAGNOSIS — D3610 Benign neoplasm of peripheral nerves and autonomic nervous system, unspecified: Secondary | ICD-10-CM | POA: Diagnosis not present

## 2015-12-06 DIAGNOSIS — M5418 Radiculopathy, sacral and sacrococcygeal region: Secondary | ICD-10-CM | POA: Insufficient documentation

## 2015-12-06 DIAGNOSIS — E1342 Other specified diabetes mellitus with diabetic polyneuropathy: Secondary | ICD-10-CM | POA: Diagnosis not present

## 2015-12-06 DIAGNOSIS — M5417 Radiculopathy, lumbosacral region: Secondary | ICD-10-CM | POA: Diagnosis not present

## 2015-12-06 DIAGNOSIS — M79671 Pain in right foot: Secondary | ICD-10-CM | POA: Diagnosis present

## 2015-12-06 DIAGNOSIS — G8929 Other chronic pain: Secondary | ICD-10-CM | POA: Diagnosis not present

## 2015-12-06 DIAGNOSIS — M792 Neuralgia and neuritis, unspecified: Secondary | ICD-10-CM

## 2015-12-06 DIAGNOSIS — Z760 Encounter for issue of repeat prescription: Secondary | ICD-10-CM | POA: Diagnosis not present

## 2015-12-06 DIAGNOSIS — G5791 Unspecified mononeuropathy of right lower limb: Secondary | ICD-10-CM

## 2015-12-06 MED ORDER — OXYCODONE HCL 15 MG PO TABS: 15.0000 mg | ORAL_TABLET | Freq: Three times a day (TID) | ORAL | 0 refills | Status: DC | PRN

## 2015-12-06 NOTE — Progress Notes (Signed)
Subjective:    Patient ID: Troy [name], male    DOB: 30-Dec-1945, 70 y.o.   MRN: [id number]  HPI   70 year old male with chronic right lower extremity pain, right S1 nerve root schwannoma. Previously tried Neurontin nonnarcotic pain medications without adequate relief but was well managed on oxycodone 15 mg a day. Denies any dizziness or constipation from the oxycodone.  Patient walks for exercise. Also does some yard work such as blowing his leaves Pain Inventory Average Pain 8 Pain Right Now 8 My pain is burning and aching  In the last 24 hours, has pain interfered with the following? General activity 8 Relation with others 8 Enjoyment of life 9 What TIME of day is your pain at its worst? Morning, evening Sleep (in general) Fair  Pain is worse with: bending and some activites Pain improves with: rest and medication Relief from Meds: 7  Mobility walk without assistance ability to climb steps?  yes do you drive?  yes Do you have any goals in this area?  yes  Function retired Do you have any goals in this area?  yes  Neuro/Psych depression  Prior Studies Any changes since last visit?  no  Physicians involved in your care Any changes since last visit?  no   Family History  Problem Relation Age of Onset  . Hypertension Mother   . Diabetes Mother   . Stroke Mother     multiple  . Hypertension Father   . Stroke Father   . Stroke Sister   . Stroke Daughter    Social History   Social History  . Marital status: Married    Spouse name: N/A  . Number of children: N/A  . Years of education: N/A   Social History Main Topics  . Smoking status: Never Smoker  . Smokeless tobacco: Never Used  . Alcohol use No  . Drug use: No  . Sexual activity: Not Asked   Other Topics Concern  . None   Social History Narrative  . None   Past Surgical History:  Procedure Laterality Date  . CARDIAC SURGERY  2003   stent  . IMPLANTATION VAGAL NERVE STIMULATOR    .  TRIGGER FINGER RELEASE    . TUMOR REMOVAL  2002   S1 tumor removal by Dr. Annette Stable  . VAGAL NERVE STIMULATOR REMOVAL     Past Medical History:  Diagnosis Date  . Arthritis   . Diabetes mellitus   . High blood pressure   . Plantar fasciitis    BP 121/72   Pulse 70   Resp 14   SpO2 96%   Opioid Risk Score:   Fall Risk Score:  `1  Depression screen PHQ 2/9  Depression screen [facility] 2/9 04/02/2015 11/10/2014 10/13/2014 09/11/2014 05/12/2014  Decreased Interest 0 0 0 0 0  Down, Depressed, Hopeless 0 0 0 0 0  PHQ - 2 Score 0 0 0 0 0  Altered sleeping - - - - 0  Tired, decreased energy - - - - 0  Change in appetite - - - - 0  Feeling bad or failure about yourself  - - - - 0  Trouble concentrating - - - - 0  Moving slowly or fidgety/restless - - - - 0  Suicidal thoughts - - - - 0  PHQ-9 Score - - - - 0      Review of Systems  Respiratory: Positive for apnea.   Endocrine:       High blood  sugar  All other systems reviewed and are negative.      Objective:   Physical Exam  Constitutional: He is oriented to person, place, and time. He appears well-developed and well-nourished.  HENT:  Head: Normocephalic and atraumatic.  Eyes: Conjunctivae and EOM are normal. Pupils are equal, round, and reactive to light.  Neck: Normal range of motion.  Neurological: He is alert and oriented to person, place, and time. He has normal strength. Gait normal.  Reflex Scores:      Patellar reflexes are 2+ on the right side and 2+ on the left side.      Achilles reflexes are 0 on the right side and 0 on the left side. Decreased sensation right L5 and right S1 dermatomal distribution.  Skin: No abrasion, no ecchymosis and no lesion noted. He is not diaphoretic. No cyanosis. No pallor.  No evidence of skin breakdown in bilateral feet.  Psychiatric: He has a normal mood and affect.  Nursing note and vitals reviewed.         Assessment & Plan:  1.  Chronic right lower extremity pain related to  schwannoma. Continue oxycodone 15 mg 3 times a day last urine drug screen September 2017 was appropriate. We'll continue current dosing. No evidence of side effects. Nurse practitioner. Follow-up in one month

## 2016-01-10 ENCOUNTER — Ambulatory Visit (HOSPITAL_BASED_OUTPATIENT_CLINIC_OR_DEPARTMENT_OTHER): Payer: Medicare Other | Admitting: Physical Medicine & Rehabilitation

## 2016-01-10 ENCOUNTER — Encounter: Payer: Self-pay | Admitting: Physical Medicine & Rehabilitation

## 2016-01-10 ENCOUNTER — Encounter: Payer: Medicare Other | Attending: Physical Medicine & Rehabilitation

## 2016-01-10 VITALS — BP 169/77 | HR 61

## 2016-01-10 DIAGNOSIS — D3610 Benign neoplasm of peripheral nerves and autonomic nervous system, unspecified: Secondary | ICD-10-CM | POA: Insufficient documentation

## 2016-01-10 DIAGNOSIS — M79671 Pain in right foot: Secondary | ICD-10-CM | POA: Diagnosis present

## 2016-01-10 DIAGNOSIS — E1142 Type 2 diabetes mellitus with diabetic polyneuropathy: Secondary | ICD-10-CM | POA: Diagnosis not present

## 2016-01-10 DIAGNOSIS — E1342 Other specified diabetes mellitus with diabetic polyneuropathy: Secondary | ICD-10-CM | POA: Insufficient documentation

## 2016-01-10 DIAGNOSIS — M5418 Radiculopathy, sacral and sacrococcygeal region: Secondary | ICD-10-CM | POA: Insufficient documentation

## 2016-01-10 DIAGNOSIS — Z760 Encounter for issue of repeat prescription: Secondary | ICD-10-CM | POA: Diagnosis not present

## 2016-01-10 DIAGNOSIS — M792 Neuralgia and neuritis, unspecified: Secondary | ICD-10-CM

## 2016-01-10 DIAGNOSIS — G8929 Other chronic pain: Secondary | ICD-10-CM | POA: Diagnosis not present

## 2016-01-10 DIAGNOSIS — G5791 Unspecified mononeuropathy of right lower limb: Secondary | ICD-10-CM

## 2016-01-10 MED ORDER — OXYCODONE HCL 15 MG PO TABS: 15.0000 mg | ORAL_TABLET | Freq: Three times a day (TID) | ORAL | 0 refills | Status: DC | PRN

## 2016-01-10 NOTE — Patient Instructions (Signed)
Continue walking would recommend 20-30 minutes per day
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
## 2016-01-31 ENCOUNTER — Encounter: Payer: Self-pay | Admitting: Registered Nurse

## 2016-01-31 ENCOUNTER — Encounter: Payer: Medicare Other | Attending: Physical Medicine & Rehabilitation | Admitting: Registered Nurse

## 2016-01-31 VITALS — BP 107/63 | HR 74 | Resp 14

## 2016-01-31 DIAGNOSIS — E1342 Other specified diabetes mellitus with diabetic polyneuropathy: Secondary | ICD-10-CM | POA: Insufficient documentation

## 2016-01-31 DIAGNOSIS — M5418 Radiculopathy, sacral and sacrococcygeal region: Secondary | ICD-10-CM | POA: Insufficient documentation

## 2016-01-31 DIAGNOSIS — G5791 Unspecified mononeuropathy of right lower limb: Secondary | ICD-10-CM | POA: Diagnosis not present

## 2016-01-31 DIAGNOSIS — G894 Chronic pain syndrome: Secondary | ICD-10-CM | POA: Diagnosis not present

## 2016-01-31 DIAGNOSIS — Z5181 Encounter for therapeutic drug level monitoring: Secondary | ICD-10-CM

## 2016-01-31 DIAGNOSIS — Z760 Encounter for issue of repeat prescription: Secondary | ICD-10-CM | POA: Insufficient documentation

## 2016-01-31 DIAGNOSIS — Z79899 Other long term (current) drug therapy: Secondary | ICD-10-CM

## 2016-01-31 DIAGNOSIS — G8929 Other chronic pain: Secondary | ICD-10-CM | POA: Insufficient documentation

## 2016-01-31 DIAGNOSIS — E1142 Type 2 diabetes mellitus with diabetic polyneuropathy: Secondary | ICD-10-CM | POA: Diagnosis not present

## 2016-01-31 DIAGNOSIS — M792 Neuralgia and neuritis, unspecified: Secondary | ICD-10-CM

## 2016-01-31 DIAGNOSIS — M79671 Pain in right foot: Secondary | ICD-10-CM | POA: Diagnosis present

## 2016-01-31 DIAGNOSIS — D3610 Benign neoplasm of peripheral nerves and autonomic nervous system, unspecified: Secondary | ICD-10-CM | POA: Diagnosis not present

## 2016-01-31 MED ORDER — OXYCODONE HCL 15 MG PO TABS: 15.0000 mg | ORAL_TABLET | Freq: Three times a day (TID) | ORAL | 0 refills | Status: DC | PRN

## 2016-01-31 NOTE — Progress Notes (Signed)
Subjective:    Patient ID: Troy [name], male    DOB: Dec 23, 1945, 70 y.o.   MRN: [id number]  HPI: [name] is a 70 year old male who returns for follow up for chronic pain and medication refill. He states his pain is located in his right foot and occasionally lower back with activity. He rates his pain 8. His current exercise regime is walking, household chores and yard work.   Pain Inventory Average Pain 8 Pain Right Now 8 My pain is aching  In the last 24 hours, has pain interfered with the following? General activity 8 Relation with others 9 Enjoyment of life 9 What TIME of day is your pain at its worst? morning, daytime Sleep (in general) Fair  Pain is worse with: bending Pain improves with: rest and medication Relief from Meds: 8  Mobility how many minutes can you walk? 30 ability to climb steps?  yes do you drive?  yes Do you have any goals in this area?  yes  Function retired Do you have any goals in this area?  yes  Neuro/Psych numbness depression  Prior Studies Any changes since last visit?  no  Physicians involved in your care Any changes since last visit?  no   Family History  Problem Relation Age of Onset  . Hypertension Mother   . Diabetes Mother   . Stroke Mother     multiple  . Hypertension Father   . Stroke Father   . Stroke Sister   . Stroke Daughter    Social History   Social History  . Marital status: Married    Spouse name: N/A  . Number of children: N/A  . Years of education: N/A   Social History Main Topics  . Smoking status: Never Smoker  . Smokeless tobacco: Never Used  . Alcohol use No  . Drug use: No  . Sexual activity: Not Asked   Other Topics Concern  . None   Social History Narrative  . None   Past Surgical History:  Procedure Laterality Date  . CARDIAC SURGERY  2003   stent  . IMPLANTATION VAGAL NERVE STIMULATOR    . TRIGGER FINGER RELEASE    . TUMOR REMOVAL  2002   S1 tumor removal by Dr.  Annette Stable  . VAGAL NERVE STIMULATOR REMOVAL     Past Medical History:  Diagnosis Date  . Arthritis   . Diabetes mellitus   . High blood pressure   . Plantar fasciitis    BP 107/63 (BP Location: Left Arm, Patient Position: Sitting, Cuff Size: Normal)   Pulse 74   Resp 14   SpO2 94%   Opioid Risk Score:   Fall Risk Score:  `1  Depression screen PHQ 2/9  Depression screen [facility] 2/9 04/02/2015 11/10/2014 10/13/2014 09/11/2014 05/12/2014  Decreased Interest 0 0 0 0 0  Down, Depressed, Hopeless 0 0 0 0 0  PHQ - 2 Score 0 0 0 0 0  Altered sleeping - - - - 0  Tired, decreased energy - - - - 0  Change in appetite - - - - 0  Feeling bad or failure about yourself  - - - - 0  Trouble concentrating - - - - 0  Moving slowly or fidgety/restless - - - - 0  Suicidal thoughts - - - - 0  PHQ-9 Score - - - - 0    Review of Systems  Constitutional: Negative.   HENT: Positive for hearing loss.  Eyes: Negative.   Respiratory: Positive for apnea.   Cardiovascular: Negative.   Gastrointestinal: Negative.   Endocrine:       High blood sugar  Genitourinary: Negative.   Musculoskeletal: Negative.   Skin: Negative.   Allergic/Immunologic: Negative.   Neurological: Positive for numbness.  Hematological: Negative.   Psychiatric/Behavioral: Positive for dysphoric mood.  All other systems reviewed and are negative.      Objective:   Physical Exam  Constitutional: He is oriented to person, place, and time. He appears well-developed and well-nourished.  HENT:  Head: Normocephalic and atraumatic.  Neck: Normal range of motion. Neck supple.  Cardiovascular: Normal rate and regular rhythm.   Pulmonary/Chest: Effort normal and breath sounds normal.  Musculoskeletal:  Normal Muscle Bulk and Muscle Testing Reveals: Upper Extremities: Full ROM and Muscle Strength 5/5 Back without spinal tenderness Lower Extremities: Full ROM and Muscle Strength 5/5 Arises from Table with ease Narrow Based Gait     Neurological: He is alert and oriented to person, place, and time.  Skin: Skin is warm and dry.  Psychiatric: He has a normal mood and affect.  Nursing note and vitals reviewed.         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  We will continue the opioid monitoring program, this consists of regular clinic visits, examinations, urine drug screen, pill counts as well as use of [place] Controlled Substance Reporting System. 2. Diabetic peripheral neuropathy affecting both feet: Continue Gabapentin.  15 minutes of face to face patient care time was spent during this visit. All questions were encouraged and answered.

## 2016-03-06 ENCOUNTER — Encounter: Payer: Medicare Other | Attending: Physical Medicine & Rehabilitation | Admitting: Registered Nurse

## 2016-03-06 ENCOUNTER — Encounter: Payer: Self-pay | Admitting: Registered Nurse

## 2016-03-06 VITALS — BP 146/72 | HR 60

## 2016-03-06 DIAGNOSIS — G5791 Unspecified mononeuropathy of right lower limb: Secondary | ICD-10-CM

## 2016-03-06 DIAGNOSIS — M79671 Pain in right foot: Secondary | ICD-10-CM | POA: Diagnosis present

## 2016-03-06 DIAGNOSIS — D3610 Benign neoplasm of peripheral nerves and autonomic nervous system, unspecified: Secondary | ICD-10-CM | POA: Diagnosis not present

## 2016-03-06 DIAGNOSIS — Z5181 Encounter for therapeutic drug level monitoring: Secondary | ICD-10-CM | POA: Diagnosis not present

## 2016-03-06 DIAGNOSIS — M5418 Radiculopathy, sacral and sacrococcygeal region: Secondary | ICD-10-CM | POA: Diagnosis not present

## 2016-03-06 DIAGNOSIS — Z79899 Other long term (current) drug therapy: Secondary | ICD-10-CM

## 2016-03-06 DIAGNOSIS — E1142 Type 2 diabetes mellitus with diabetic polyneuropathy: Secondary | ICD-10-CM | POA: Diagnosis not present

## 2016-03-06 DIAGNOSIS — Z760 Encounter for issue of repeat prescription: Secondary | ICD-10-CM | POA: Diagnosis not present

## 2016-03-06 DIAGNOSIS — G8929 Other chronic pain: Secondary | ICD-10-CM | POA: Insufficient documentation

## 2016-03-06 DIAGNOSIS — M792 Neuralgia and neuritis, unspecified: Secondary | ICD-10-CM

## 2016-03-06 DIAGNOSIS — E1342 Other specified diabetes mellitus with diabetic polyneuropathy: Secondary | ICD-10-CM | POA: Insufficient documentation

## 2016-03-06 DIAGNOSIS — G894 Chronic pain syndrome: Secondary | ICD-10-CM | POA: Diagnosis not present

## 2016-03-06 MED ORDER — OXYCODONE HCL 15 MG PO TABS: 15.0000 mg | ORAL_TABLET | Freq: Three times a day (TID) | ORAL | 0 refills | Status: DC | PRN

## 2016-03-06 NOTE — Addendum Note (Signed)
Addended by: [name] D on: 03/06/2016 01:19 PM   Modules accepted: Orders

## 2016-03-06 NOTE — Progress Notes (Signed)
Subjective:    Patient ID: Troy [name], male    DOB: 1945-07-27, 71 y.o.   MRN: [id number]  HPI:  [name] is a 71 year old male who returns for follow up appointment for chronic pain and medication refill. He states his pain is located in his right foot and occasionally lower back with activity. He rates his pain 8. His current exercise regime is walking, performing stretching exercises and household chores..  Pain Inventory Average Pain 7 Pain Right Now 8 My pain is sharp and aching  In the last 24 hours, has pain interfered with the following? General activity 7 Relation with others 7 Enjoyment of life 8 What TIME of day is your pain at its worst? daytime, evening Sleep (in general) Fair  Pain is worse with: bending Pain improves with: rest and medication Relief from Meds: 7  Mobility walk without assistance how many minutes can you walk? 20 ability to climb steps?  yes do you drive?  yes Do you have any goals in this area?  yes  Function retired Do you have any goals in this area?  yes  Neuro/Psych numbness depression  Prior Studies Any changes since last visit?  no  Physicians involved in your care Any changes since last visit?  no   Family History  Problem Relation Age of Onset  . Hypertension Mother   . Diabetes Mother   . Stroke Mother     multiple  . Hypertension Father   . Stroke Father   . Stroke Sister   . Stroke Daughter    Social History   Social History  . Marital status: Married    Spouse name: N/A  . Number of children: N/A  . Years of education: N/A   Social History Main Topics  . Smoking status: Never Smoker  . Smokeless tobacco: Never Used  . Alcohol use No  . Drug use: No  . Sexual activity: Not Asked   Other Topics Concern  . None   Social History Narrative  . None   Past Surgical History:  Procedure Laterality Date  . CARDIAC SURGERY  2003   stent  . IMPLANTATION VAGAL NERVE STIMULATOR    . TRIGGER  FINGER RELEASE    . TUMOR REMOVAL  2002   S1 tumor removal by Dr. Annette Stable  . VAGAL NERVE STIMULATOR REMOVAL     Past Medical History:  Diagnosis Date  . Arthritis   . Diabetes mellitus   . High blood pressure   . Plantar fasciitis    BP (!) 146/72   Pulse 60   SpO2 97%   Opioid Risk Score:   Fall Risk Score:  `1  Depression screen PHQ 2/9  Depression screen [facility] 2/9 04/02/2015 11/10/2014 10/13/2014 09/11/2014 05/12/2014  Decreased Interest 0 0 0 0 0  Down, Depressed, Hopeless 0 0 0 0 0  PHQ - 2 Score 0 0 0 0 0  Altered sleeping - - - - 0  Tired, decreased energy - - - - 0  Change in appetite - - - - 0  Feeling bad or failure about yourself  - - - - 0  Trouble concentrating - - - - 0  Moving slowly or fidgety/restless - - - - 0  Suicidal thoughts - - - - 0  PHQ-9 Score - - - - 0    Review of Systems  Constitutional: Negative.   HENT: Negative.   Eyes: Negative.   Respiratory: Positive for  apnea.   Cardiovascular: Negative.   Gastrointestinal: Negative.   Endocrine: Negative.        High blood sugar  Musculoskeletal: Negative.   Skin: Negative.   Allergic/Immunologic: Negative.   Neurological: Positive for numbness.  Hematological: Negative.   Psychiatric/Behavioral: Positive for dysphoric mood.  All other systems reviewed and are negative.      Objective:   Physical Exam  Constitutional: He is oriented to person, place, and time. He appears well-developed and well-nourished.  HENT:  Head: Normocephalic and atraumatic.  Neck: Normal range of motion. Neck supple.  Cardiovascular: Normal rate and regular rhythm.   Pulmonary/Chest: Effort normal and breath sounds normal.  Musculoskeletal:  Normal Muscle Bulk and Muscle Testing Reveals: Upper Extremities: Full ROM and Muscle Strength 5/5 Lower Extremities: Full ROM and Muscle Strength 5/5 Arises from Table with Ease  Narrow Based Gait  Neurological: He is alert and oriented to person, place, and time.  Skin: Skin  is warm and dry.  Psychiatric: He has a normal mood and affect.  Nursing note and vitals reviewed.         Assessment & Plan:  Refilled: Oxycodone 15 mg # 90 pills---use one pill every 8 hours prn.  We will continue the opioid monitoring program, this consists of regular clinic visits, examinations, urine drug screen, pill counts as well as use of [place] Controlled Substance Reporting System. 2. Diabetic peripheral neuropathy affecting both feet: Continue Gabapentin.  15 minutes of face to face patient care time was spent during this visit. All questions were encouraged and answered.

## 2016-03-12 LAB — TOXASSURE SELECT,+ANTIDEPR,UR

## 2016-03-14 NOTE — Progress Notes (Signed)
Urine drug screen for this encounter is consistent for prescribed medication 

## 2016-04-03 ENCOUNTER — Encounter: Payer: Self-pay | Admitting: Registered Nurse

## 2016-04-03 ENCOUNTER — Telehealth: Payer: Self-pay | Admitting: Registered Nurse

## 2016-04-03 ENCOUNTER — Encounter: Payer: Medicare Other | Attending: Physical Medicine & Rehabilitation | Admitting: Registered Nurse

## 2016-04-03 VITALS — BP 128/75 | HR 63

## 2016-04-03 DIAGNOSIS — M545 Low back pain, unspecified: Secondary | ICD-10-CM

## 2016-04-03 DIAGNOSIS — D3610 Benign neoplasm of peripheral nerves and autonomic nervous system, unspecified: Secondary | ICD-10-CM | POA: Diagnosis not present

## 2016-04-03 DIAGNOSIS — G894 Chronic pain syndrome: Secondary | ICD-10-CM | POA: Diagnosis not present

## 2016-04-03 DIAGNOSIS — E1142 Type 2 diabetes mellitus with diabetic polyneuropathy: Secondary | ICD-10-CM

## 2016-04-03 DIAGNOSIS — M79671 Pain in right foot: Secondary | ICD-10-CM | POA: Insufficient documentation

## 2016-04-03 DIAGNOSIS — M5418 Radiculopathy, sacral and sacrococcygeal region: Secondary | ICD-10-CM | POA: Diagnosis not present

## 2016-04-03 DIAGNOSIS — Z760 Encounter for issue of repeat prescription: Secondary | ICD-10-CM | POA: Insufficient documentation

## 2016-04-03 DIAGNOSIS — G8929 Other chronic pain: Secondary | ICD-10-CM | POA: Insufficient documentation

## 2016-04-03 DIAGNOSIS — G5791 Unspecified mononeuropathy of right lower limb: Secondary | ICD-10-CM

## 2016-04-03 DIAGNOSIS — Z79899 Other long term (current) drug therapy: Secondary | ICD-10-CM

## 2016-04-03 DIAGNOSIS — E1342 Other specified diabetes mellitus with diabetic polyneuropathy: Secondary | ICD-10-CM | POA: Insufficient documentation

## 2016-04-03 DIAGNOSIS — M792 Neuralgia and neuritis, unspecified: Secondary | ICD-10-CM

## 2016-04-03 DIAGNOSIS — Z5181 Encounter for therapeutic drug level monitoring: Secondary | ICD-10-CM

## 2016-04-03 MED ORDER — OXYCODONE HCL 15 MG PO TABS: 15.0000 mg | ORAL_TABLET | Freq: Three times a day (TID) | ORAL | 0 refills | Status: DC | PRN

## 2016-04-03 NOTE — Progress Notes (Signed)
Subjective:    Patient ID: Troy [name], male    DOB: 1945-02-28, 71 y.o.   MRN: [id number]  HPI: [name] is a 71 year old male who returns for follow up appointment for chronic pain and medication refill. He states his pain is located in his right foot and occasionally lower back with activity. He rates his pain 8. His current exercise regime is walking, performing stretching exercises and household chores.  Pain Inventory Average Pain 7 Pain Right Now 8 My pain is aching  In the last 24 hours, has pain interfered with the following? General activity 7 Relation with others 7 Enjoyment of life 7 What TIME of day is your pain at its worst? daytime Sleep (in general) .  Pain is worse with: . Pain improves with: . Relief from Meds: .  Mobility walk without assistance ability to climb steps?  yes do you drive?  yes  Function retired  Neuro/Psych depression  Prior Studies Any changes since last visit?  no  Physicians involved in your care Any changes since last visit?  no   Family History  Problem Relation Age of Onset  . Hypertension Mother   . Diabetes Mother   . Stroke Mother     multiple  . Hypertension Father   . Stroke Father   . Stroke Sister   . Stroke Daughter    Social History   Social History  . Marital status: Married    Spouse name: N/A  . Number of children: N/A  . Years of education: N/A   Social History Main Topics  . Smoking status: Never Smoker  . Smokeless tobacco: Never Used  . Alcohol use No  . Drug use: No  . Sexual activity: Not on file   Other Topics Concern  . Not on file   Social History Narrative  . No narrative on file   Past Surgical History:  Procedure Laterality Date  . CARDIAC SURGERY  2003   stent  . IMPLANTATION VAGAL NERVE STIMULATOR    . TRIGGER FINGER RELEASE    . TUMOR REMOVAL  2002   S1 tumor removal by Dr. Annette Stable  . VAGAL NERVE STIMULATOR REMOVAL     Past Medical History:  Diagnosis Date    . Arthritis   . Diabetes mellitus   . High blood pressure   . Plantar fasciitis    There were no vitals taken for this visit.  Opioid Risk Score:   Fall Risk Score:  `1  Depression screen PHQ 2/9  Depression screen [facility] 2/9 04/02/2015 11/10/2014 10/13/2014 09/11/2014 05/12/2014  Decreased Interest 0 0 0 0 0  Down, Depressed, Hopeless 0 0 0 0 0  PHQ - 2 Score 0 0 0 0 0  Altered sleeping - - - - 0  Tired, decreased energy - - - - 0  Change in appetite - - - - 0  Feeling bad or failure about yourself  - - - - 0  Trouble concentrating - - - - 0  Moving slowly or fidgety/restless - - - - 0  Suicidal thoughts - - - - 0  PHQ-9 Score - - - - 0   Review of Systems  Constitutional: Negative.   HENT: Negative.   Eyes: Negative.   Respiratory: Negative.   Cardiovascular: Negative.   Gastrointestinal: Negative.   Endocrine: Negative.   Genitourinary: Negative.   Musculoskeletal: Negative.   Skin: Negative.   Allergic/Immunologic: Negative.   Neurological: Negative.  Hematological: Negative.   Psychiatric/Behavioral: Positive for dysphoric mood.       Objective:   Physical Exam  Constitutional: He is oriented to person, place, and time. He appears well-developed and well-nourished.  HENT:  Head: Normocephalic and atraumatic.  Neck: Normal range of motion. Neck supple.  Cardiovascular: Normal rate and regular rhythm.   Pulmonary/Chest: Effort normal and breath sounds normal.  Musculoskeletal:  Normal Muscle Bulk and Muscle Testing Reveals: Upper Extremities: Full ROM and Muscle Strength 5/5 Back without spinal Tenderness Noted Lower Extremities: Full ROM and Muscle Strength 5/5 Arises from Table with ease Narrow Based Gait   Neurological: He is alert and oriented to person, place, and time.  Skin: Skin is warm and dry.  Psychiatric: He has a normal mood and affect.  Nursing note and vitals reviewed.         Assessment & Plan:  1. .Chronic left S1 radiculopathy due  to schwannoma: 04/03/2016 Refilled: Oxycodone 15 mg # 90 pills---use one pill every 8 hours prn.  We will continue the opioid monitoring program, this consists of regular clinic visits, examinations, urine drug screen, pill counts as well as use of [place] Controlled Substance Reporting System. 2. Diabetic peripheral neuropathy affecting both feet: Continue Gabapentin.  15 minutes of face to face patient care time was spent during this visit. All questions were encouraged and answered.  F/U in 1 month

## 2016-04-03 NOTE — Telephone Encounter (Signed)
On 04/03/2016 the [place] was reviewed no conflict was seen on the [place] with multiple prescribers. Mr. Billey has a signed narcotic contract with our office. If there were any discrepancies this would have been reported to his physician.

## 2016-05-01 ENCOUNTER — Encounter: Payer: Self-pay | Admitting: Registered Nurse

## 2016-05-01 ENCOUNTER — Encounter: Payer: Medicare Other | Attending: Physical Medicine & Rehabilitation | Admitting: Registered Nurse

## 2016-05-01 VITALS — BP 123/59 | HR 65

## 2016-05-01 DIAGNOSIS — E1342 Other specified diabetes mellitus with diabetic polyneuropathy: Secondary | ICD-10-CM | POA: Diagnosis not present

## 2016-05-01 DIAGNOSIS — M5418 Radiculopathy, sacral and sacrococcygeal region: Secondary | ICD-10-CM | POA: Diagnosis not present

## 2016-05-01 DIAGNOSIS — D3610 Benign neoplasm of peripheral nerves and autonomic nervous system, unspecified: Secondary | ICD-10-CM | POA: Insufficient documentation

## 2016-05-01 DIAGNOSIS — E1142 Type 2 diabetes mellitus with diabetic polyneuropathy: Secondary | ICD-10-CM | POA: Diagnosis not present

## 2016-05-01 DIAGNOSIS — Z760 Encounter for issue of repeat prescription: Secondary | ICD-10-CM | POA: Diagnosis not present

## 2016-05-01 DIAGNOSIS — Z5181 Encounter for therapeutic drug level monitoring: Secondary | ICD-10-CM

## 2016-05-01 DIAGNOSIS — M79671 Pain in right foot: Secondary | ICD-10-CM | POA: Diagnosis present

## 2016-05-01 DIAGNOSIS — Z79899 Other long term (current) drug therapy: Secondary | ICD-10-CM | POA: Diagnosis not present

## 2016-05-01 DIAGNOSIS — M545 Low back pain, unspecified: Secondary | ICD-10-CM

## 2016-05-01 DIAGNOSIS — G894 Chronic pain syndrome: Secondary | ICD-10-CM

## 2016-05-01 DIAGNOSIS — G8929 Other chronic pain: Secondary | ICD-10-CM | POA: Insufficient documentation

## 2016-05-01 MED ORDER — OXYCODONE HCL 15 MG PO TABS: 15.0000 mg | ORAL_TABLET | Freq: Three times a day (TID) | ORAL | 0 refills | Status: DC | PRN

## 2016-05-01 NOTE — Progress Notes (Signed)
Subjective:    Patient ID: Troy [name], male    DOB: Apr 10, 1945, 71 y.o.   MRN: [id number]    HPI: [name] is a 71 year old male who returns for follow up appointmentfor chronic pain and medication refill. He states his pain is located in his right foot and occasionally lower back with activity. He rates his pain 8. His current exercise regime is walking, performing stretching exercises and household chores.  Pain Inventory Average Pain 8 Pain Right Now 8 My pain is aching  In the last 24 hours, has pain interfered with the following? General activity 8 Relation with others 7 Enjoyment of life 8 What TIME of day is your pain at its worst? morning, daytime, evening Sleep (in general) Fair  Pain is worse with: bending and some activites Pain improves with: rest and medication Relief from Meds: 8  Mobility ability to climb steps?  yes do you drive?  yes Do you have any goals in this area?  yes  Function retired Do you have any goals in this area?  yes  Neuro/Psych depression  Prior Studies Any changes since last visit?  no  Physicians involved in your care Any changes since last visit?  no   Family History  Problem Relation Age of Onset  . Hypertension Mother   . Diabetes Mother   . Stroke Mother     multiple  . Hypertension Father   . Stroke Father   . Stroke Sister   . Stroke Daughter    Social History   Social History  . Marital status: Married    Spouse name: N/A  . Number of children: N/A  . Years of education: N/A   Social History Main Topics  . Smoking status: Never Smoker  . Smokeless tobacco: Never Used  . Alcohol use No  . Drug use: No  . Sexual activity: Not Asked   Other Topics Concern  . None   Social History Narrative  . None   Past Surgical History:  Procedure Laterality Date  . CARDIAC SURGERY  2003   stent  . IMPLANTATION VAGAL NERVE STIMULATOR    . TRIGGER FINGER RELEASE    . TUMOR REMOVAL  2002   S1 tumor  removal by Dr. Annette Stable  . VAGAL NERVE STIMULATOR REMOVAL     Past Medical History:  Diagnosis Date  . Arthritis   . Diabetes mellitus   . High blood pressure   . Plantar fasciitis    BP (!) 123/59   Pulse 65   SpO2 96%   Opioid Risk Score:   Fall Risk Score:  `1  Depression screen PHQ 2/9  Depression screen [facility] 2/9 04/02/2015 11/10/2014 10/13/2014 09/11/2014 05/12/2014  Decreased Interest 0 0 0 0 0  Down, Depressed, Hopeless 0 0 0 0 0  PHQ - 2 Score 0 0 0 0 0  Altered sleeping - - - - 0  Tired, decreased energy - - - - 0  Change in appetite - - - - 0  Feeling bad or failure about yourself  - - - - 0  Trouble concentrating - - - - 0  Moving slowly or fidgety/restless - - - - 0  Suicidal thoughts - - - - 0  PHQ-9 Score - - - - 0    Review of Systems  Constitutional: Negative.   HENT: Negative.   Eyes: Negative.   Respiratory: Positive for apnea.   Cardiovascular: Negative.   Gastrointestinal: Negative.  Endocrine: Negative.   Genitourinary: Negative.   Musculoskeletal: Negative.   Skin: Negative.   Allergic/Immunologic: Negative.   Neurological: Negative.   Hematological: Negative.   Psychiatric/Behavioral: Positive for dysphoric mood.       Objective:   Physical Exam  Constitutional: He is oriented to person, place, and time. He appears well-developed and well-nourished.  HENT:  Head: Normocephalic and atraumatic.  Neck: Normal range of motion. Neck supple.  Cardiovascular: Normal rate and regular rhythm.   Pulmonary/Chest: Effort normal and breath sounds normal.  Musculoskeletal:  Normal Muscle Bulk and Muscle Testing Reveals: Upper Extremities: Full ROM and Muscle Strength 5/5 Back without Spinal Tenderness Lower Extremities: Full ROM and Muscle Strength 5/5 Arises from table with ease Narrow Based Gait   Neurological: He is alert and oriented to person, place, and time.  Skin: Skin is warm and dry.  Psychiatric: He has a normal mood and affect.    Nursing note and vitals reviewed.         Assessment & Plan:  1. 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  We will continue the opioid monitoring program, this consists of regular clinic visits, examinations, urine drug screen, pill counts as well as use of [place] Controlled Substance Reporting System. 2. Diabetic peripheral neuropathy affecting both feet: Continue Gabapentin.  15 minutes of face to face patient care time was spent during this visit. All questions were encouraged and answered.   F/U in 1 month

## 2016-06-01 ENCOUNTER — Encounter: Payer: Medicare Other | Attending: Physical Medicine & Rehabilitation | Admitting: Registered Nurse

## 2016-06-01 ENCOUNTER — Encounter: Payer: Self-pay | Admitting: Registered Nurse

## 2016-06-01 VITALS — BP 149/75 | HR 70

## 2016-06-01 DIAGNOSIS — M5418 Radiculopathy, sacral and sacrococcygeal region: Secondary | ICD-10-CM | POA: Diagnosis not present

## 2016-06-01 DIAGNOSIS — E1342 Other specified diabetes mellitus with diabetic polyneuropathy: Secondary | ICD-10-CM | POA: Diagnosis not present

## 2016-06-01 DIAGNOSIS — Z760 Encounter for issue of repeat prescription: Secondary | ICD-10-CM | POA: Diagnosis not present

## 2016-06-01 DIAGNOSIS — G8929 Other chronic pain: Secondary | ICD-10-CM | POA: Diagnosis present

## 2016-06-01 DIAGNOSIS — M545 Low back pain, unspecified: Secondary | ICD-10-CM

## 2016-06-01 DIAGNOSIS — Z5181 Encounter for therapeutic drug level monitoring: Secondary | ICD-10-CM

## 2016-06-01 DIAGNOSIS — E1142 Type 2 diabetes mellitus with diabetic polyneuropathy: Secondary | ICD-10-CM | POA: Diagnosis not present

## 2016-06-01 DIAGNOSIS — M79671 Pain in right foot: Secondary | ICD-10-CM | POA: Diagnosis present

## 2016-06-01 DIAGNOSIS — G894 Chronic pain syndrome: Secondary | ICD-10-CM | POA: Diagnosis not present

## 2016-06-01 DIAGNOSIS — D3610 Benign neoplasm of peripheral nerves and autonomic nervous system, unspecified: Secondary | ICD-10-CM | POA: Insufficient documentation

## 2016-06-01 DIAGNOSIS — Z79899 Other long term (current) drug therapy: Secondary | ICD-10-CM | POA: Diagnosis not present

## 2016-06-01 MED ORDER — OXYCODONE HCL 15 MG PO TABS: 15.0000 mg | ORAL_TABLET | Freq: Three times a day (TID) | ORAL | 0 refills | Status: DC | PRN

## 2016-06-01 NOTE — Progress Notes (Signed)
Subjective:    Patient ID: Troy [name], male    DOB: 1945-06-29, 71 y.o.   MRN: [id number]  HPI:  Troy [name] is a 71year old male who returns for follow up appointmentfor chronic pain and medication refill. He states his pain is located in his right foot and occasionally lower back with activity. He rates his pain 9. His current exercise regime is walking, performing stretching exercises and household chores.  Communicated with Troy [name] via grease board, his hearing aid not functioning.   Troy [name] last UDS was on 03/06/2016 was consistent.  Pain Inventory Average Pain 9 Pain Right Now 9 My pain is sharp and aching  In the last 24 hours, has pain interfered with the following? General activity 8 Relation with others 8 Enjoyment of life 9 What TIME of day is your pain at its worst? daytime evening and night Sleep (in general) Fair  Pain is worse with: bending and some activites Pain improves with: medication Relief from Meds: 7  Mobility how many minutes can you walk? 20 ability to climb steps?  yes do you drive?  yes  Function retired  Neuro/Psych bladder control problems numbness depression  Prior Studies Any changes since last visit?  no  Physicians involved in your care Any changes since last visit?  no   Family History  Problem Relation Age of Onset  . Hypertension Mother   . Diabetes Mother   . Stroke Mother     multiple  . Hypertension Father   . Stroke Father   . Stroke Sister   . Stroke Daughter    Social History   Social History  . Marital status: Married    Spouse name: N/A  . Number of children: N/A  . Years of education: N/A   Social History Main Topics  . Smoking status: Never Smoker  . Smokeless tobacco: Never Used  . Alcohol use No  . Drug use: No  . Sexual activity: Not Asked   Other Topics Concern  . None   Social History Narrative  . None   Past Surgical History:  Procedure Laterality Date  . CARDIAC  SURGERY  2003   stent  . IMPLANTATION VAGAL NERVE STIMULATOR    . TRIGGER FINGER RELEASE    . TUMOR REMOVAL  2002   S1 tumor removal by Dr. Annette Stable  . VAGAL NERVE STIMULATOR REMOVAL     Past Medical History:  Diagnosis Date  . Arthritis   . Diabetes mellitus   . High blood pressure   . Plantar fasciitis    BP (!) 149/75   Pulse 70   SpO2 95%   Opioid Risk Score:   Fall Risk Score:  `1  Depression screen PHQ 2/9  Depression screen [facility] 2/9 06/01/2016 04/02/2015 11/10/2014 10/13/2014 09/11/2014 05/12/2014  Decreased Interest 0 0 0 0 0 0  Down, Depressed, Hopeless 0 0 0 0 0 0  PHQ - 2 Score 0 0 0 0 0 0  Altered sleeping - - - - - 0  Tired, decreased energy - - - - - 0  Change in appetite - - - - - 0  Feeling bad or failure about yourself  - - - - - 0  Trouble concentrating - - - - - 0  Moving slowly or fidgety/restless - - - - - 0  Suicidal thoughts - - - - - 0  PHQ-9 Score - - - - - 0    Review of  Systems  Constitutional: Negative.   HENT: Negative.   Eyes: Negative.   Respiratory: Negative.   Cardiovascular: Negative.   Gastrointestinal: Negative.   Endocrine:       High blood sugar  Genitourinary: Negative.   Musculoskeletal: Negative.   Skin: Negative.   Allergic/Immunologic: Negative.   Neurological: Negative.   Hematological: Negative.   Psychiatric/Behavioral: Negative.   All other systems reviewed and are negative.      Objective:   Physical Exam  Constitutional: He is oriented to person, place, and time. He appears well-developed and well-nourished.  HENT:  Head: Normocephalic and atraumatic.  Neck: Normal range of motion. Neck supple.  Cardiovascular: Normal rate and regular rhythm.   Pulmonary/Chest: Effort normal and breath sounds normal.  Musculoskeletal:  Normal Muscle Bulk and Muscle Testing Reveals: Upper Extremities: Full ROM and Muscle Strength 5/5 Lower Extremities: Full ROM and Muscle Strength 5/5 Right Lower Extremity Flexion Produces Pain  into Right Foot Arises from Table with ease Narrow Based Gait  Neurological: He is alert and oriented to person, place, and time.  Skin: Skin is warm and dry.  Psychiatric: He has a normal mood and affect.  Nursing note and vitals reviewed.         Assessment & Plan:  1. 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  We will continue the opioid monitoring program, this consists of regular clinic visits, examinations, urine drug screen, pill counts as well as use of [place] Controlled Substance Reporting System. 2. Diabetic peripheral neuropathy affecting both feet: Continue Gabapentin. 06/01/2016  15 minutes of face to face patient care time was spent during this visit. All questions were encouraged and answered.   F/U in 1 month

## 2016-07-04 ENCOUNTER — Encounter: Payer: Medicare Other | Attending: Physical Medicine & Rehabilitation | Admitting: Registered Nurse

## 2016-07-04 ENCOUNTER — Telehealth: Payer: Self-pay | Admitting: Registered Nurse

## 2016-07-04 ENCOUNTER — Encounter: Payer: Self-pay | Admitting: Registered Nurse

## 2016-07-04 VITALS — BP 115/64 | HR 61

## 2016-07-04 DIAGNOSIS — D3610 Benign neoplasm of peripheral nerves and autonomic nervous system, unspecified: Secondary | ICD-10-CM | POA: Insufficient documentation

## 2016-07-04 DIAGNOSIS — E1142 Type 2 diabetes mellitus with diabetic polyneuropathy: Secondary | ICD-10-CM

## 2016-07-04 DIAGNOSIS — G894 Chronic pain syndrome: Secondary | ICD-10-CM | POA: Diagnosis not present

## 2016-07-04 DIAGNOSIS — M545 Low back pain, unspecified: Secondary | ICD-10-CM

## 2016-07-04 DIAGNOSIS — E1342 Other specified diabetes mellitus with diabetic polyneuropathy: Secondary | ICD-10-CM | POA: Insufficient documentation

## 2016-07-04 DIAGNOSIS — M5418 Radiculopathy, sacral and sacrococcygeal region: Secondary | ICD-10-CM | POA: Diagnosis not present

## 2016-07-04 DIAGNOSIS — G8929 Other chronic pain: Secondary | ICD-10-CM | POA: Insufficient documentation

## 2016-07-04 DIAGNOSIS — Z79899 Other long term (current) drug therapy: Secondary | ICD-10-CM | POA: Diagnosis not present

## 2016-07-04 DIAGNOSIS — M79671 Pain in right foot: Secondary | ICD-10-CM | POA: Diagnosis present

## 2016-07-04 DIAGNOSIS — Z5181 Encounter for therapeutic drug level monitoring: Secondary | ICD-10-CM | POA: Diagnosis not present

## 2016-07-04 DIAGNOSIS — Z760 Encounter for issue of repeat prescription: Secondary | ICD-10-CM | POA: Diagnosis not present

## 2016-07-04 MED ORDER — OXYCODONE HCL 15 MG PO TABS: 15.0000 mg | ORAL_TABLET | Freq: Three times a day (TID) | ORAL | 0 refills | Status: DC | PRN

## 2016-07-04 NOTE — Progress Notes (Signed)
Subjective:    Patient ID: Troy [name], male    DOB: [birth year], 71 y.o.   MRN: [id number]  HPI: Troy [name] is a 71year old male who returns for follow up appointmentfor chronic pain and medication refill. He states his pain is located in his right foot and occasionally lower back with activity. He rates his pain 8. His current exercise regime is walking, performing stretching exercises, yard work and household chores.  Troy [name] last UDS was on 03/06/2016 was consistent. UDS ordered today.   Pain Inventory Average Pain 8 Pain Right Now 8 My pain is n/a  In the last 24 hours, has pain interfered with the following? General activity 8 Relation with others 7 Enjoyment of life 8 What TIME of day is your pain at its worst? daytime and evening Sleep (in general) Fair  Pain is worse with: walking, bending and some activites Pain improves with: rest and medication Relief from Meds: 8  Mobility how many minutes can you walk? 20 ability to climb steps?  yes do you drive?  yes  Function retired  Neuro/Psych numbness depression  Prior Studies Any changes since last visit?  no  Physicians involved in your care Any changes since last visit?  no   Family History  Problem Relation Age of Onset  . Hypertension Mother   . Diabetes Mother   . Stroke Mother        multiple  . Hypertension Father   . Stroke Father   . Stroke Sister   . Stroke Daughter    Social History   Social History  . Marital status: Married    Spouse name: N/A  . Number of children: N/A  . Years of education: N/A   Social History Main Topics  . Smoking status: Never Smoker  . Smokeless tobacco: Never Used  . Alcohol use No  . Drug use: No  . Sexual activity: Not Asked   Other Topics Concern  . None   Social History Narrative  . None   Past Surgical History:  Procedure Laterality Date  . CARDIAC SURGERY  2003   stent  . IMPLANTATION VAGAL NERVE STIMULATOR    . TRIGGER  FINGER RELEASE    . TUMOR REMOVAL  2002   S1 tumor removal by Dr. Annette Stable  . VAGAL NERVE STIMULATOR REMOVAL     Past Medical History:  Diagnosis Date  . Arthritis   . Diabetes mellitus   . High blood pressure   . Plantar fasciitis    BP 115/64   Pulse 61   SpO2 97%   Opioid Risk Score:  1 Fall Risk Score:  `1  Depression screen PHQ 2/9  Depression screen [facility] 2/9 07/04/2016 06/01/2016 04/02/2015 11/10/2014 10/13/2014 09/11/2014 05/12/2014  Decreased Interest 0 0 0 0 0 0 0  Down, Depressed, Hopeless 0 0 0 0 0 0 0  PHQ - 2 Score 0 0 0 0 0 0 0  Altered sleeping - - - - - - 0  Tired, decreased energy - - - - - - 0  Change in appetite - - - - - - 0  Feeling bad or failure about yourself  - - - - - - 0  Trouble concentrating - - - - - - 0  Moving slowly or fidgety/restless - - - - - - 0  Suicidal thoughts - - - - - - 0  PHQ-9 Score - - - - - - 0  Review of Systems  Constitutional: Negative.   HENT: Negative.   Eyes: Negative.   Respiratory: Negative.   Cardiovascular: Negative.   Gastrointestinal: Negative.   Endocrine: Negative.   Genitourinary: Negative.   Musculoskeletal: Negative.   Skin: Negative.   Allergic/Immunologic: Negative.   Neurological: Positive for numbness.  Hematological: Negative.   Psychiatric/Behavioral: Positive for dysphoric mood.  All other systems reviewed and are negative.      Objective:   Physical Exam  Constitutional: He is oriented to person, place, and time. He appears well-developed and well-nourished.  HENT:  Head: Normocephalic and atraumatic.  Neck: Normal range of motion. Neck supple.  Cardiovascular: Normal rate and regular rhythm.   Pulmonary/Chest: Effort normal and breath sounds normal.  Musculoskeletal:  Normal Muscle Bulk and Muscle Testing Reveals: Upper Extremities: Full ROM and Muscle Strength 5/5  Back without spinal tenderness Lower Extremities: Full ROM and Muscle Strength 5/5 Arises from Chair with ease Narrow Based  Gait  Neurological: He is alert and oriented to person, place, and time.  Skin: Skin is warm and dry.  Psychiatric: He has a normal mood and affect.  Nursing note and vitals reviewed.         Assessment & Plan:  1. 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  We will continue the opioid monitoring program, this consists of regular clinic visits, examinations, urine drug screen, pill counts as well as use of [place] Controlled Substance Reporting System. 2. Diabetic peripheral neuropathy affecting both feet: Continue Gabapentin. 07/04/2016  15 minutes of face to face patient care time was spent during this visit. All questions were encouraged and answered.   F/U in 1 month

## 2016-07-04 NOTE — Telephone Encounter (Signed)
On 07/04/2016 the [place] was reviewed no conflict was seen on the [place] with multiple prescribers.  Troy [name] has a signed narcotic contract with our office. If there were any discrepancies this would have been reported to his physician.

## 2016-07-08 LAB — TOXASSURE SELECT,+ANTIDEPR,UR

## 2016-07-10 ENCOUNTER — Telehealth: Payer: Self-pay | Admitting: *Deleted

## 2016-07-10 NOTE — Telephone Encounter (Signed)
Urine drug screen for this encounter is consistent for prescribed medication. We are not prescribing venlafaxine.

## 2016-08-02 ENCOUNTER — Encounter: Payer: Self-pay | Admitting: Registered Nurse

## 2016-08-02 ENCOUNTER — Encounter: Payer: Medicare Other | Attending: Physical Medicine & Rehabilitation | Admitting: Registered Nurse

## 2016-08-02 VITALS — BP 113/64 | HR 60

## 2016-08-02 DIAGNOSIS — M79671 Pain in right foot: Secondary | ICD-10-CM | POA: Insufficient documentation

## 2016-08-02 DIAGNOSIS — Z79899 Other long term (current) drug therapy: Secondary | ICD-10-CM

## 2016-08-02 DIAGNOSIS — Z5181 Encounter for therapeutic drug level monitoring: Secondary | ICD-10-CM | POA: Diagnosis not present

## 2016-08-02 DIAGNOSIS — G894 Chronic pain syndrome: Secondary | ICD-10-CM | POA: Diagnosis not present

## 2016-08-02 DIAGNOSIS — M545 Low back pain, unspecified: Secondary | ICD-10-CM

## 2016-08-02 DIAGNOSIS — E1342 Other specified diabetes mellitus with diabetic polyneuropathy: Secondary | ICD-10-CM | POA: Diagnosis not present

## 2016-08-02 DIAGNOSIS — M5418 Radiculopathy, sacral and sacrococcygeal region: Secondary | ICD-10-CM | POA: Insufficient documentation

## 2016-08-02 DIAGNOSIS — Z760 Encounter for issue of repeat prescription: Secondary | ICD-10-CM | POA: Insufficient documentation

## 2016-08-02 DIAGNOSIS — G8929 Other chronic pain: Secondary | ICD-10-CM | POA: Insufficient documentation

## 2016-08-02 DIAGNOSIS — E1142 Type 2 diabetes mellitus with diabetic polyneuropathy: Secondary | ICD-10-CM | POA: Diagnosis not present

## 2016-08-02 DIAGNOSIS — D3610 Benign neoplasm of peripheral nerves and autonomic nervous system, unspecified: Secondary | ICD-10-CM | POA: Insufficient documentation

## 2016-08-02 MED ORDER — OXYCODONE HCL 15 MG PO TABS: 15.0000 mg | ORAL_TABLET | Freq: Three times a day (TID) | ORAL | 0 refills | Status: DC | PRN

## 2016-08-02 NOTE — Progress Notes (Signed)
Subjective:    Patient ID: Troy [name], male    DOB: 15-May-1945, 71 y.o.   MRN: [id number]  HPI: [name] is a 71year old male who returns for follow up appointmentfor chronic pain and medication refill. He states his pain is located in his right foot and occasionally lower back with activity. He rates his pain 8. His current exercise regime is walking, performing stretching exercises, yard work and household chores.  [name] last UDS was on 07/04/2016 was consistent.    Pain Inventory Average Pain 8 Pain Right Now 8 My pain is aching  In the last 24 hours, has pain interfered with the following? General activity 7 Relation with others 8 Enjoyment of life 8 What TIME of day is your pain at its worst? daytime, evening Sleep (in general) Fair  Pain is worse with: bending and some activites Pain improves with: rest and medication Relief from Meds: 8  Mobility walk without assistance how many minutes can you walk? 20 ability to climb steps?  yes do you drive?  yes Do you have any goals in this area?  yes  Function retired  Neuro/Psych bladder control problems numbness depression  Prior Studies Any changes since last visit?  no  Physicians involved in your care Any changes since last visit?  no   Family History  Problem Relation Age of Onset  . Hypertension Mother   . Diabetes Mother   . Stroke Mother        multiple  . Hypertension Father   . Stroke Father   . Stroke Sister   . Stroke Daughter    Social History   Social History  . Marital status: Married    Spouse name: N/A  . Number of children: N/A  . Years of education: N/A   Social History Main Topics  . Smoking status: Never Smoker  . Smokeless tobacco: Never Used  . Alcohol use No  . Drug use: No  . Sexual activity: Not Asked   Other Topics Concern  . None   Social History Narrative  . None   Past Surgical History:  Procedure Laterality Date  . CARDIAC SURGERY  2003    stent  . IMPLANTATION VAGAL NERVE STIMULATOR    . TRIGGER FINGER RELEASE    . TUMOR REMOVAL  2002   S1 tumor removal by Dr. Annette Stable  . VAGAL NERVE STIMULATOR REMOVAL     Past Medical History:  Diagnosis Date  . Arthritis   . Diabetes mellitus   . High blood pressure   . Plantar fasciitis    BP 113/64 (BP Location: Right Arm, Patient Position: Sitting, Cuff Size: Normal)   Pulse 60   SpO2 98%   Opioid Risk Score:   Fall Risk Score:  `1  Depression screen PHQ 2/9  Depression screen [facility] 2/9 07/04/2016 06/01/2016 04/02/2015 11/10/2014 10/13/2014 09/11/2014 05/12/2014  Decreased Interest 0 0 0 0 0 0 0  Down, Depressed, Hopeless 1 0 0 0 0 0 0  PHQ - 2 Score 1 0 0 0 0 0 0  Altered sleeping - - - - - - 0  Tired, decreased energy - - - - - - 0  Change in appetite - - - - - - 0  Feeling bad or failure about yourself  - - - - - - 0  Trouble concentrating - - - - - - 0  Moving slowly or fidgety/restless - - - - - - 0  Suicidal thoughts - - - - - - 0  PHQ-9 Score - - - - - - 0    Review of Systems  Constitutional: Negative.   HENT: Negative.   Eyes: Negative.   Respiratory: Positive for apnea.   Cardiovascular: Negative.   Gastrointestinal: Negative.   Endocrine:       High blood sugar  Genitourinary: Positive for difficulty urinating.  Musculoskeletal: Negative.   Skin: Negative.   Allergic/Immunologic: Negative.   Neurological: Positive for numbness.       Neuropathy  Hematological: Negative.   Psychiatric/Behavioral: Positive for dysphoric mood.  All other systems reviewed and are negative.      Objective:   Physical Exam  Constitutional: He is oriented to person, place, and time. He appears well-developed and well-nourished.  HENT:  Head: Normocephalic and atraumatic.  Neck: Normal range of motion. Neck supple.  Cardiovascular: Normal rate and regular rhythm.   Pulmonary/Chest: Effort normal and breath sounds normal.  Musculoskeletal:  Normal Muscle Bulk and Muscle  Testing Reveals: Upper Extremities: Full ROM and Muscle Strength 5/5 Back without spinal tenderness Lower Extremities: Full ROM and Muscle Strength 5/5 Arises from Table with ease Narrow Based Gait  Neurological: He is alert and oriented to person, place, and time.  Skin: Skin is warm and dry.  Psychiatric: He has a normal mood and affect.  Nursing note and vitals reviewed.         Assessment & Plan:  1. 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  We will continue the opioid monitoring program, this consists of regular clinic visits, examinations, urine drug screen, pill counts as well as use of [place] Controlled Substance Reporting System. 2. Diabetic peripheral neuropathy affecting both feet: Continue Gabapentin. 08/02/2016  20  minutes of face to face patient care time was spent during this visit. All questions were encouraged and answered.  F/U in 1 month

## 2016-08-31 ENCOUNTER — Ambulatory Visit (HOSPITAL_BASED_OUTPATIENT_CLINIC_OR_DEPARTMENT_OTHER): Payer: Medicare Other | Admitting: Physical Medicine & Rehabilitation

## 2016-08-31 ENCOUNTER — Encounter: Payer: Self-pay | Admitting: Physical Medicine & Rehabilitation

## 2016-08-31 ENCOUNTER — Encounter: Payer: Medicare Other | Attending: Physical Medicine & Rehabilitation

## 2016-08-31 VITALS — BP 114/64 | HR 63 | Resp 14

## 2016-08-31 DIAGNOSIS — M79671 Pain in right foot: Secondary | ICD-10-CM | POA: Diagnosis present

## 2016-08-31 DIAGNOSIS — M5418 Radiculopathy, sacral and sacrococcygeal region: Secondary | ICD-10-CM | POA: Diagnosis not present

## 2016-08-31 DIAGNOSIS — E1342 Other specified diabetes mellitus with diabetic polyneuropathy: Secondary | ICD-10-CM | POA: Diagnosis not present

## 2016-08-31 DIAGNOSIS — D3610 Benign neoplasm of peripheral nerves and autonomic nervous system, unspecified: Secondary | ICD-10-CM | POA: Diagnosis not present

## 2016-08-31 DIAGNOSIS — E1149 Type 2 diabetes mellitus with other diabetic neurological complication: Secondary | ICD-10-CM | POA: Diagnosis not present

## 2016-08-31 DIAGNOSIS — G8929 Other chronic pain: Secondary | ICD-10-CM | POA: Insufficient documentation

## 2016-08-31 DIAGNOSIS — Z760 Encounter for issue of repeat prescription: Secondary | ICD-10-CM | POA: Insufficient documentation

## 2016-08-31 DIAGNOSIS — E1140 Type 2 diabetes mellitus with diabetic neuropathy, unspecified: Secondary | ICD-10-CM | POA: Diagnosis not present

## 2016-08-31 DIAGNOSIS — G5791 Unspecified mononeuropathy of right lower limb: Secondary | ICD-10-CM | POA: Diagnosis not present

## 2016-08-31 DIAGNOSIS — M5417 Radiculopathy, lumbosacral region: Secondary | ICD-10-CM

## 2016-08-31 DIAGNOSIS — M792 Neuralgia and neuritis, unspecified: Secondary | ICD-10-CM

## 2016-08-31 MED ORDER — OXYCODONE HCL 15 MG PO TABS: 15.0000 mg | ORAL_TABLET | Freq: Three times a day (TID) | ORAL | 0 refills | Status: DC | PRN

## 2016-08-31 NOTE — Patient Instructions (Signed)
Continue to stay active, keep an eye on your skin on the right foot to make sure you have no sores or cuts

## 2016-08-31 NOTE — Progress Notes (Signed)
Subjective:    Patient ID: Troy [name], male    DOB: 09-15-1945, 71 y.o.   MRN: [id number]  HPI  Patient has had some chest discomfort. Recently saw his primary physician who checked an EKG and ordered a stress test. Patient still has right foot numbness and not much pain at this point. Patient feels like his medications are ineffective.  Patient denies any numbness in the left foot. He states his diabetic control is good  Cuts his own grass with push mower, walks at the mall, a couple times a week  Independent with all self-care and mobility, drives his own car  Pain Inventory Average Pain 8 Pain Right Now 8 My pain is aching  In the last 24 hours, has pain interfered with the following? General activity 8 Relation with others 7 Enjoyment of life 8 What TIME of day is your pain at its worst? daytime, evening Sleep (in general) Good  Pain is worse with: bending and some activites Pain improves with: rest and medication Relief from Meds: 8  Mobility walk without assistance how many minutes can you walk? 20 ability to climb steps?  yes do you drive?  yes Do you have any goals in this area?  yes  Function retired  Neuro/Psych bladder control problems depression  Prior Studies Any changes since last visit?  no  Physicians involved in your care Any changes since last visit?  no   Family History  Problem Relation Age of Onset  . Hypertension Mother   . Diabetes Mother   . Stroke Mother        multiple  . Hypertension Father   . Stroke Father   . Stroke Sister   . Stroke Daughter    Social History   Social History  . Marital status: Married    Spouse name: N/A  . Number of children: N/A  . Years of education: N/A   Social History Main Topics  . Smoking status: Never Smoker  . Smokeless tobacco: Never Used  . Alcohol use No  . Drug use: No  . Sexual activity: Not Asked   Other Topics Concern  . None   Social History Narrative  . None    Past Surgical History:  Procedure Laterality Date  . CARDIAC SURGERY  2003   stent  . IMPLANTATION VAGAL NERVE STIMULATOR    . TRIGGER FINGER RELEASE    . TUMOR REMOVAL  2002   S1 tumor removal by Dr. Annette Stable  . VAGAL NERVE STIMULATOR REMOVAL     Past Medical History:  Diagnosis Date  . Arthritis   . Diabetes mellitus   . High blood pressure   . Plantar fasciitis    BP 114/64 (BP Location: Right Arm, Patient Position: Sitting, Cuff Size: Normal)   Pulse 63   Resp 14   SpO2 96%   Opioid Risk Score:   Fall Risk Score:  `1  Depression screen PHQ 2/9  Depression screen [facility] 2/9 07/04/2016 06/01/2016 04/02/2015 11/10/2014 10/13/2014 09/11/2014 05/12/2014  Decreased Interest 0 0 0 0 0 0 0  Down, Depressed, Hopeless 1 0 0 0 0 0 0  PHQ - 2 Score 1 0 0 0 0 0 0  Altered sleeping - - - - - - 0  Tired, decreased energy - - - - - - 0  Change in appetite - - - - - - 0  Feeling bad or failure about yourself  - - - - - - 0  Trouble  concentrating - - - - - - 0  Moving slowly or fidgety/restless - - - - - - 0  Suicidal thoughts - - - - - - 0  PHQ-9 Score - - - - - - 0    Review of Systems  Constitutional: Negative.   HENT: Negative.   Eyes: Negative.   Respiratory: Negative.   Cardiovascular: Negative.   Gastrointestinal: Negative.   Endocrine:       High blood sugar  Genitourinary: Positive for difficulty urinating.  Musculoskeletal: Negative.   Skin: Negative.   Allergic/Immunologic: Negative.   Neurological: Negative.   Hematological: Negative.   Psychiatric/Behavioral: Positive for dysphoric mood.       Objective:   Physical Exam  Constitutional: He is oriented to person, place, and time. He appears well-developed and well-nourished.  HENT:  Head: Normocephalic and atraumatic.  HOH  Eyes: Pupils are equal, round, and reactive to light. Conjunctivae and EOM are normal.  Neck: Normal range of motion.  Neurological: He is alert and oriented to person, place, and time.   Skin: Skin is warm and dry.  Psychiatric: He has a normal mood and affect.  Nursing note and vitals reviewed.  Decreased sensation right S1, right S2 distribution to pinprick. Able to stand on tiptoes with both feet. No skin breakdown on the legs, has some scratches from his dog on the right anterior shin area     Assessment & Plan:  1. Neuropathic foot pain secondary to schwannoma. Right S1 distribution, sensory deficits and pain. Medications including gabapentin 800 mg 4 times a day and oxycodone 15 mg 3 times a day are ineffective. Recent urine drug screen was appropriate on 07/04/2016.  We'll continue current medications, Continue opioid monitoring program. This consists of regular clinic visits, examinations, urine drug screen, pill counts as well as use of [place] controlled substance reporting System.

## 2016-09-28 ENCOUNTER — Encounter: Payer: Self-pay | Admitting: Registered Nurse

## 2016-09-28 ENCOUNTER — Telehealth: Payer: Self-pay | Admitting: Registered Nurse

## 2016-09-28 ENCOUNTER — Encounter: Payer: Medicare Other | Attending: Physical Medicine & Rehabilitation | Admitting: Registered Nurse

## 2016-09-28 VITALS — BP 138/67 | HR 63

## 2016-09-28 DIAGNOSIS — G894 Chronic pain syndrome: Secondary | ICD-10-CM | POA: Diagnosis not present

## 2016-09-28 DIAGNOSIS — M545 Low back pain, unspecified: Secondary | ICD-10-CM

## 2016-09-28 DIAGNOSIS — G8929 Other chronic pain: Secondary | ICD-10-CM | POA: Diagnosis present

## 2016-09-28 DIAGNOSIS — E1342 Other specified diabetes mellitus with diabetic polyneuropathy: Secondary | ICD-10-CM | POA: Diagnosis not present

## 2016-09-28 DIAGNOSIS — M5418 Radiculopathy, sacral and sacrococcygeal region: Secondary | ICD-10-CM | POA: Diagnosis not present

## 2016-09-28 DIAGNOSIS — Z760 Encounter for issue of repeat prescription: Secondary | ICD-10-CM | POA: Insufficient documentation

## 2016-09-28 DIAGNOSIS — Z5181 Encounter for therapeutic drug level monitoring: Secondary | ICD-10-CM

## 2016-09-28 DIAGNOSIS — M792 Neuralgia and neuritis, unspecified: Secondary | ICD-10-CM

## 2016-09-28 DIAGNOSIS — E1140 Type 2 diabetes mellitus with diabetic neuropathy, unspecified: Secondary | ICD-10-CM

## 2016-09-28 DIAGNOSIS — E1149 Type 2 diabetes mellitus with other diabetic neurological complication: Secondary | ICD-10-CM

## 2016-09-28 DIAGNOSIS — Z79899 Other long term (current) drug therapy: Secondary | ICD-10-CM | POA: Diagnosis not present

## 2016-09-28 DIAGNOSIS — G5791 Unspecified mononeuropathy of right lower limb: Secondary | ICD-10-CM

## 2016-09-28 DIAGNOSIS — M79671 Pain in right foot: Secondary | ICD-10-CM | POA: Diagnosis present

## 2016-09-28 DIAGNOSIS — D3610 Benign neoplasm of peripheral nerves and autonomic nervous system, unspecified: Secondary | ICD-10-CM | POA: Insufficient documentation

## 2016-09-28 MED ORDER — OXYCODONE HCL 15 MG PO TABS: 15.0000 mg | ORAL_TABLET | Freq: Three times a day (TID) | ORAL | 0 refills | Status: DC | PRN

## 2016-09-28 NOTE — Telephone Encounter (Signed)
On 09/28/2016 the [place] was reviewed no conflict was seen on the [place] with multiple prescribers. Mr. Muto has a signed narcotic contract with our office. If there were any discrepancies this would have been reported to his physician.

## 2016-09-28 NOTE — Progress Notes (Signed)
Subjective:    Patient ID: Troy [name], male    DOB: December 22, 1945, 71 y.o.   MRN: [id number]  HPI:  Troy [name] is a 71year old male who returns for follow up appointmentfor chronic pain and medication refill. He states his pain is located in his right foot and occasionally lower back with activity. He rates his pain 0. His current exercise regime is walking, performing stretching exercises, yard workand household chores.  Troy [name] last UDS was on 07/04/2016 was consistent.    Pain Inventory Average Pain 7 Pain Right Now 0 My pain is aching  In the last 24 hours, has pain interfered with the following? General activity 7 Relation with others 8 Enjoyment of life 8 What TIME of day is your pain at its worst? daytime Sleep (in general) Fair  Pain is worse with: bending Pain improves with: rest and medication Relief from Meds: 8  Mobility walk without assistance ability to climb steps?  yes do you drive?  yes  Function retired  Neuro/Psych bladder control problems depression  Prior Studies Any changes since last visit?  no  Physicians involved in your care Any changes since last visit?  no   Family History  Problem Relation Age of Onset  . Hypertension Mother   . Diabetes Mother   . Stroke Mother        multiple  . Hypertension Father   . Stroke Father   . Stroke Sister   . Stroke Daughter    Social History   Social History  . Marital status: Married    Spouse name: N/A  . Number of children: N/A  . Years of education: N/A   Social History Main Topics  . Smoking status: Never Smoker  . Smokeless tobacco: Never Used  . Alcohol use No  . Drug use: No  . Sexual activity: Not on file   Other Topics Concern  . Not on file   Social History Narrative  . No narrative on file   Past Surgical History:  Procedure Laterality Date  . CARDIAC SURGERY  2003   stent  . IMPLANTATION VAGAL NERVE STIMULATOR    . TRIGGER FINGER RELEASE    . TUMOR  REMOVAL  2002   S1 tumor removal by Dr. Annette Stable  . VAGAL NERVE STIMULATOR REMOVAL     Past Medical History:  Diagnosis Date  . Arthritis   . Diabetes mellitus   . High blood pressure   . Plantar fasciitis    There were no vitals taken for this visit.  Opioid Risk Score:   Fall Risk Score:  `1  Depression screen PHQ 2/9  Depression screen [facility] 2/9 07/04/2016 06/01/2016 04/02/2015 11/10/2014 10/13/2014 09/11/2014 05/12/2014  Decreased Interest 0 0 0 0 0 0 0  Down, Depressed, Hopeless 1 0 0 0 0 0 0  PHQ - 2 Score 1 0 0 0 0 0 0  Altered sleeping - - - - - - 0  Tired, decreased energy - - - - - - 0  Change in appetite - - - - - - 0  Feeling bad or failure about yourself  - - - - - - 0  Trouble concentrating - - - - - - 0  Moving slowly or fidgety/restless - - - - - - 0  Suicidal thoughts - - - - - - 0  PHQ-9 Score - - - - - - 0     Review of Systems  Constitutional: Negative.  HENT: Negative.   Eyes: Negative.   Respiratory: Positive for apnea.   Cardiovascular: Negative.   Gastrointestinal: Negative.   Endocrine: Negative.   Genitourinary: Negative.   Musculoskeletal: Negative.   Skin: Negative.   Allergic/Immunologic: Negative.   Neurological: Negative.   Hematological: Negative.   Psychiatric/Behavioral: Negative.   All other systems reviewed and are negative.      Objective:   Physical Exam  Constitutional: He is oriented to person, place, and time. He appears well-developed and well-nourished.  HENT:  Head: Normocephalic and atraumatic.  Neck: Normal range of motion. Neck supple.  Cardiovascular: Normal rate and regular rhythm.   Pulmonary/Chest: Effort normal and breath sounds normal.  Musculoskeletal:  Normal Muscle Bulk and Muscle Testing Reveals: Upper Extremities: Full ROM and Muscle Strength 5/5 Lower Extremities: Full ROM and Muscle Strength 5/5 Arises from Table with ease Narrow Based Gait  Neurological: He is alert and oriented to person, place, and  time.  Skin: Skin is warm and dry.  Psychiatric: He has a normal mood and affect.  Nursing note and vitals reviewed.         Assessment & Plan:  1. 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  We will continue the opioid monitoring program, this consists of regular clinic visits, examinations, urine drug screen, pill counts as well as use of [place] Controlled Substance Reporting System. 2. Diabetic peripheral neuropathy affecting both feet: Continue Gabapentin. 09/28/2016  20  minutes of face to face patient care time was spent during this visit. All questions were encouraged and answered.  F/U in 1 month

## 2016-11-01 ENCOUNTER — Encounter: Payer: Medicare Other | Attending: Physical Medicine & Rehabilitation | Admitting: Registered Nurse

## 2016-11-01 ENCOUNTER — Encounter: Payer: Self-pay | Admitting: Registered Nurse

## 2016-11-01 VITALS — BP 134/67 | HR 59 | Resp 14

## 2016-11-01 DIAGNOSIS — E1342 Other specified diabetes mellitus with diabetic polyneuropathy: Secondary | ICD-10-CM | POA: Insufficient documentation

## 2016-11-01 DIAGNOSIS — M79671 Pain in right foot: Secondary | ICD-10-CM | POA: Insufficient documentation

## 2016-11-01 DIAGNOSIS — M545 Low back pain, unspecified: Secondary | ICD-10-CM

## 2016-11-01 DIAGNOSIS — G894 Chronic pain syndrome: Secondary | ICD-10-CM | POA: Diagnosis not present

## 2016-11-01 DIAGNOSIS — Z79899 Other long term (current) drug therapy: Secondary | ICD-10-CM | POA: Diagnosis not present

## 2016-11-01 DIAGNOSIS — E1149 Type 2 diabetes mellitus with other diabetic neurological complication: Secondary | ICD-10-CM

## 2016-11-01 DIAGNOSIS — G8929 Other chronic pain: Secondary | ICD-10-CM | POA: Insufficient documentation

## 2016-11-01 DIAGNOSIS — E1140 Type 2 diabetes mellitus with diabetic neuropathy, unspecified: Secondary | ICD-10-CM | POA: Diagnosis not present

## 2016-11-01 DIAGNOSIS — Z5181 Encounter for therapeutic drug level monitoring: Secondary | ICD-10-CM | POA: Diagnosis not present

## 2016-11-01 DIAGNOSIS — D3610 Benign neoplasm of peripheral nerves and autonomic nervous system, unspecified: Secondary | ICD-10-CM | POA: Diagnosis not present

## 2016-11-01 DIAGNOSIS — M5418 Radiculopathy, sacral and sacrococcygeal region: Secondary | ICD-10-CM | POA: Insufficient documentation

## 2016-11-01 DIAGNOSIS — Z760 Encounter for issue of repeat prescription: Secondary | ICD-10-CM | POA: Insufficient documentation

## 2016-11-01 DIAGNOSIS — G5791 Unspecified mononeuropathy of right lower limb: Secondary | ICD-10-CM

## 2016-11-01 DIAGNOSIS — M792 Neuralgia and neuritis, unspecified: Secondary | ICD-10-CM

## 2016-11-01 MED ORDER — OXYCODONE HCL 15 MG PO TABS: 15.0000 mg | ORAL_TABLET | Freq: Three times a day (TID) | ORAL | 0 refills | Status: DC | PRN

## 2016-11-01 NOTE — Progress Notes (Signed)
Subjective:    Patient ID: Troy [name], male    DOB: August 11, 1945, 71 y.o.   MRN: [id number]  HPI:  Troy [name] is a 71year old male who returns for follow up appointmentfor chronic pain and medication refill. He states his pain is located in his right foot and occasionally lower back with activity. He rates his pain 8. His current exercise regime is walking, performing stretching exercises, yard workand household chores.  Troy [name] last UDS was on 07/04/2016 was consistent.    Pain Inventory Average Pain 8 Pain Right Now 8 My pain is aching  In the last 24 hours, has pain interfered with the following? General activity 7 Relation with others 8 Enjoyment of life 7 What TIME of day is your pain at its worst? morning, daytime Sleep (in general) Fair  Pain is worse with: bending Pain improves with: rest and medication Relief from Meds: 8  Mobility walk without assistance how many minutes can you walk? 20 ability to climb steps?  yes do you drive?  yes Do you have any goals in this area?  yes  Function retired Do you have any goals in this area?  yes  Neuro/Psych bladder control problems numbness depression  Prior Studies Any changes since last visit?  no  Physicians involved in your care Any changes since last visit?  no   Family History  Problem Relation Age of Onset  . Hypertension Mother   . Diabetes Mother   . Stroke Mother        multiple  . Hypertension Father   . Stroke Father   . Stroke Sister   . Stroke Daughter    Social History   Social History  . Marital status: Married    Spouse name: N/A  . Number of children: N/A  . Years of education: N/A   Social History Main Topics  . Smoking status: Never Smoker  . Smokeless tobacco: Never Used  . Alcohol use No  . Drug use: No  . Sexual activity: Not Asked   Other Topics Concern  . None   Social History Narrative  . None   Past Surgical History:  Procedure Laterality Date    . CARDIAC SURGERY  2003   stent  . IMPLANTATION VAGAL NERVE STIMULATOR    . TRIGGER FINGER RELEASE    . TUMOR REMOVAL  2002   S1 tumor removal by Dr. Annette Stable  . VAGAL NERVE STIMULATOR REMOVAL     Past Medical History:  Diagnosis Date  . Arthritis   . Diabetes mellitus   . High blood pressure   . Plantar fasciitis    BP 134/67 (BP Location: Left Arm, Patient Position: Sitting, Cuff Size: Normal)   Pulse (!) 59   Resp 14   SpO2 98%   Opioid Risk Score:   Fall Risk Score:  `1  Depression screen PHQ 2/9  Depression screen [facility] 2/9 07/04/2016 06/01/2016 04/02/2015 11/10/2014 10/13/2014 09/11/2014 05/12/2014  Decreased Interest 0 0 0 0 0 0 0  Down, Depressed, Hopeless 1 0 0 0 0 0 0  PHQ - 2 Score 1 0 0 0 0 0 0  Altered sleeping - - - - - - 0  Tired, decreased energy - - - - - - 0  Change in appetite - - - - - - 0  Feeling bad or failure about yourself  - - - - - - 0  Trouble concentrating - - - - - - 0  Moving slowly or fidgety/restless - - - - - - 0  Suicidal thoughts - - - - - - 0  PHQ-9 Score - - - - - - 0     Review of Systems  Constitutional: Negative.   HENT: Negative.   Eyes: Negative.   Respiratory: Positive for apnea.   Cardiovascular: Negative.   Gastrointestinal: Negative.   Endocrine: Negative.   Genitourinary: Negative.   Musculoskeletal: Positive for arthralgias.  Skin: Negative.   Allergic/Immunologic: Negative.   Neurological: Positive for numbness.  Hematological: Negative.   Psychiatric/Behavioral: Positive for dysphoric mood.  All other systems reviewed and are negative.      Objective:   Physical Exam  Constitutional: He is oriented to person, place, and time. He appears well-developed and well-nourished.  HENT:  Head: Normocephalic and atraumatic.  Neck: Normal range of motion. Neck supple.  Cardiovascular: Normal rate and regular rhythm.   Pulmonary/Chest: Effort normal and breath sounds normal.  Musculoskeletal:  Normal Muscle Bulk and  Muscle Testing Reveals: Upper Extremities: Full ROM and Muscle Strength 5/5 Back without spinal tenderness noted Lower Extremities: Full ROM and Muscle Strength 5/5 Arises from Table with ease Narrow Based Gait  Neurological: He is alert and oriented to person, place, and time.  Skin: Skin is warm and dry.  Psychiatric: He has a normal mood and affect.  Nursing note and vitals reviewed.         Assessment & Plan:  1. 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  We will continue the opioid monitoring program, this consists of regular clinic visits, examinations, urine drug screen, pill counts as well as use of [place] Controlled Substance Reporting System. 2. Diabetic peripheral neuropathy affecting both feet: Continue Gabapentin. 11/09/2016  20  minutes of face to face patient care time was spent during this visit. All questions were encouraged and answered.  F/U in 1 month

## 2016-11-30 ENCOUNTER — Encounter: Payer: Self-pay | Admitting: Registered Nurse

## 2016-11-30 ENCOUNTER — Encounter: Payer: Medicare Other | Attending: Physical Medicine & Rehabilitation | Admitting: Registered Nurse

## 2016-11-30 ENCOUNTER — Telehealth: Payer: Self-pay | Admitting: Registered Nurse

## 2016-11-30 VITALS — BP 126/51 | HR 60

## 2016-11-30 DIAGNOSIS — Z5181 Encounter for therapeutic drug level monitoring: Secondary | ICD-10-CM

## 2016-11-30 DIAGNOSIS — Z79899 Other long term (current) drug therapy: Secondary | ICD-10-CM | POA: Diagnosis not present

## 2016-11-30 DIAGNOSIS — M545 Low back pain, unspecified: Secondary | ICD-10-CM

## 2016-11-30 DIAGNOSIS — E1342 Other specified diabetes mellitus with diabetic polyneuropathy: Secondary | ICD-10-CM | POA: Insufficient documentation

## 2016-11-30 DIAGNOSIS — Z760 Encounter for issue of repeat prescription: Secondary | ICD-10-CM | POA: Insufficient documentation

## 2016-11-30 DIAGNOSIS — G5791 Unspecified mononeuropathy of right lower limb: Secondary | ICD-10-CM | POA: Diagnosis not present

## 2016-11-30 DIAGNOSIS — G894 Chronic pain syndrome: Secondary | ICD-10-CM | POA: Diagnosis not present

## 2016-11-30 DIAGNOSIS — E1149 Type 2 diabetes mellitus with other diabetic neurological complication: Secondary | ICD-10-CM

## 2016-11-30 DIAGNOSIS — G8929 Other chronic pain: Secondary | ICD-10-CM | POA: Diagnosis present

## 2016-11-30 DIAGNOSIS — E1140 Type 2 diabetes mellitus with diabetic neuropathy, unspecified: Secondary | ICD-10-CM

## 2016-11-30 DIAGNOSIS — M79671 Pain in right foot: Secondary | ICD-10-CM | POA: Diagnosis present

## 2016-11-30 DIAGNOSIS — D3610 Benign neoplasm of peripheral nerves and autonomic nervous system, unspecified: Secondary | ICD-10-CM | POA: Diagnosis not present

## 2016-11-30 DIAGNOSIS — M792 Neuralgia and neuritis, unspecified: Secondary | ICD-10-CM

## 2016-11-30 DIAGNOSIS — M5418 Radiculopathy, sacral and sacrococcygeal region: Secondary | ICD-10-CM | POA: Insufficient documentation

## 2016-11-30 MED ORDER — OXYCODONE HCL 15 MG PO TABS: 15.0000 mg | ORAL_TABLET | Freq: Three times a day (TID) | ORAL | 0 refills | Status: DC | PRN

## 2016-11-30 NOTE — Telephone Encounter (Signed)
On 11/30/2016 the  [place] was reviewed no conflict was seen on the [place] with multiple prescribers. Troy [name] has a signed narcotic contract with our office. If there were any discrepancies this would have been reported to his physician.

## 2016-11-30 NOTE — Progress Notes (Signed)
Subjective:    Patient ID: Troy [name], male    DOB: 18-May-1945, 71 y.o.   MRN: [id number]  HPI:  [name] is a 71year old male who returns for follow up appointmentfor chronic pain and medication refill. He states his pain is located in his right foot and occasionally lower back with activity. He rates his pain 8. His current exercise regime is walking ( mall walking 2- 3 times a week for 10 minutes) also , performing stretching exercises, yard workand household chores.  Troy [name] Morphine equivalent is 67.5 MME  Troy [name] last UDS was on 07/04/2016 was consistent.    Pain Inventory Average Pain 7 Pain Right Now 8 My pain is aching  In the last 24 hours, has pain interfered with the following? General activity 7 Relation with others 8 Enjoyment of life 7 What TIME of day is your pain at its worst? morning, daytime Sleep (in general) Fair  Pain is worse with: bending Pain improves with: rest and medication Relief from Meds: 8  Mobility walk without assistance how many minutes can you walk? 20 ability to climb steps?  yes do you drive?  yes Do you have any goals in this area?  yes  Function retired Do you have any goals in this area?  yes  Neuro/Psych bladder control problems numbness depression  Prior Studies Any changes since last visit?  no  Physicians involved in your care Any changes since last visit?  no   Family History  Problem Relation Age of Onset  . Hypertension Mother   . Diabetes Mother   . Stroke Mother        multiple  . Hypertension Father   . Stroke Father   . Stroke Sister   . Stroke Daughter    Social History   Social History  . Marital status: Married    Spouse name: N/A  . Number of children: N/A  . Years of education: N/A   Social History Main Topics  . Smoking status: Never Smoker  . Smokeless tobacco: Never Used  . Alcohol use No  . Drug use: No  . Sexual activity: Not on file   Other Topics Concern    . Not on file   Social History Narrative  . No narrative on file   Past Surgical History:  Procedure Laterality Date  . CARDIAC SURGERY  2003   stent  . IMPLANTATION VAGAL NERVE STIMULATOR    . TRIGGER FINGER RELEASE    . TUMOR REMOVAL  2002   S1 tumor removal by Dr. Annette Stable  . VAGAL NERVE STIMULATOR REMOVAL     Past Medical History:  Diagnosis Date  . Arthritis   . Diabetes mellitus   . High blood pressure   . Plantar fasciitis    There were no vitals taken for this visit.  Opioid Risk Score:   Fall Risk Score:  `1  Depression screen PHQ 2/9  Depression screen [facility] 2/9 07/04/2016 06/01/2016 04/02/2015 11/10/2014 10/13/2014 09/11/2014 05/12/2014  Decreased Interest 0 0 0 0 0 0 0  Down, Depressed, Hopeless 1 0 0 0 0 0 0  PHQ - 2 Score 1 0 0 0 0 0 0  Altered sleeping - - - - - - 0  Tired, decreased energy - - - - - - 0  Change in appetite - - - - - - 0  Feeling bad or failure about yourself  - - - - - - 0  Trouble concentrating - - - - - - 0  Moving slowly or fidgety/restless - - - - - - 0  Suicidal thoughts - - - - - - 0  PHQ-9 Score - - - - - - 0     Review of Systems  Constitutional: Negative.   HENT: Negative.   Eyes: Negative.   Respiratory: Positive for apnea.   Cardiovascular: Negative.   Gastrointestinal: Negative.   Endocrine: Negative.   Genitourinary: Negative.   Musculoskeletal: Positive for arthralgias.  Skin: Negative.   Allergic/Immunologic: Negative.   Neurological: Positive for numbness.  Hematological: Negative.   Psychiatric/Behavioral: Positive for dysphoric mood.  All other systems reviewed and are negative.      Objective:   Physical Exam  Constitutional: He is oriented to person, place, and time. He appears well-developed and well-nourished.  HENT:  Head: Normocephalic and atraumatic.  Neck: Normal range of motion. Neck supple.  Cardiovascular: Normal rate and regular rhythm.   Pulmonary/Chest: Effort normal and breath sounds normal.   Musculoskeletal:  Normal Muscle Bulk and Muscle Testing Reveals: Upper Extremities: Full  ROM and Muscle Strength 5/5 Back  without spinal tenderness noted Lower Extremities: Full ROM and Muscle Strength 5/5 Arises from Table with ease Narrow Based Gait  Neurological: He is alert and oriented to person, place, and time.  Skin: Skin is warm and dry.  Psychiatric: He has a normal mood and affect.  Nursing note and vitals reviewed.         Assessment & Plan:  1. 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  We will continue the opioid monitoring program, this consists of regular clinic visits, examinations, urine drug screen, pill counts as well as use of [place] Controlled Substance Reporting System. 2. Diabetic peripheral neuropathy affecting both feet: Continue Gabapentin. 11/30/2016  15 minutes of face to face patient care time was spent during this visit. All questions were encouraged and answered.  F/U in 1 month

## 2017-01-01 ENCOUNTER — Ambulatory Visit (HOSPITAL_BASED_OUTPATIENT_CLINIC_OR_DEPARTMENT_OTHER): Payer: Medicare Other | Admitting: Physical Medicine & Rehabilitation

## 2017-01-01 ENCOUNTER — Encounter: Payer: Self-pay | Admitting: Physical Medicine & Rehabilitation

## 2017-01-01 ENCOUNTER — Other Ambulatory Visit: Payer: Self-pay

## 2017-01-01 ENCOUNTER — Encounter: Payer: Medicare Other | Attending: Physical Medicine & Rehabilitation

## 2017-01-01 VITALS — BP 117/69 | HR 61

## 2017-01-01 DIAGNOSIS — M79671 Pain in right foot: Secondary | ICD-10-CM | POA: Diagnosis present

## 2017-01-01 DIAGNOSIS — G8929 Other chronic pain: Secondary | ICD-10-CM | POA: Insufficient documentation

## 2017-01-01 DIAGNOSIS — Z760 Encounter for issue of repeat prescription: Secondary | ICD-10-CM | POA: Insufficient documentation

## 2017-01-01 DIAGNOSIS — M5418 Radiculopathy, sacral and sacrococcygeal region: Secondary | ICD-10-CM | POA: Insufficient documentation

## 2017-01-01 DIAGNOSIS — Z86018 Personal history of other benign neoplasm: Secondary | ICD-10-CM

## 2017-01-01 DIAGNOSIS — G5791 Unspecified mononeuropathy of right lower limb: Secondary | ICD-10-CM

## 2017-01-01 DIAGNOSIS — D3610 Benign neoplasm of peripheral nerves and autonomic nervous system, unspecified: Secondary | ICD-10-CM | POA: Insufficient documentation

## 2017-01-01 DIAGNOSIS — M792 Neuralgia and neuritis, unspecified: Secondary | ICD-10-CM

## 2017-01-01 DIAGNOSIS — E1342 Other specified diabetes mellitus with diabetic polyneuropathy: Secondary | ICD-10-CM | POA: Insufficient documentation

## 2017-01-01 MED ORDER — OXYCODONE HCL 15 MG PO TABS: 15.0000 mg | ORAL_TABLET | Freq: Three times a day (TID) | ORAL | 0 refills | Status: DC | PRN

## 2017-01-01 NOTE — Patient Instructions (Signed)
Please stay as active as possible

## 2017-01-01 NOTE — Progress Notes (Signed)
Subjective:    Patient ID: Troy [name], male    DOB: 11-10-1945, 71 y.o.   MRN: [id number]  HPI No new medical issues since last visit  Chronic low back and right foot pain Pain Inventory Average Pain 8 Pain Right Now 7 My pain is aching  In the last 24 hours, has pain interfered with the following? General activity 7 Relation with others 8 Enjoyment of life 8 What TIME of day is your pain at its worst? daytime Sleep (in general) Fair  Pain is worse with: bending Pain improves with: rest and medication Relief from Meds: 5  Mobility walk without assistance  Function retired  Neuro/Psych numbness depression  Prior Studies Any changes since last visit?  no  Physicians involved in your care Any changes since last visit?  no   Family History  Problem Relation Age of Onset  . Hypertension Mother   . Diabetes Mother   . Stroke Mother        multiple  . Hypertension Father   . Stroke Father   . Stroke Sister   . Stroke Daughter    Social History   Socioeconomic History  . Marital status: Married    Spouse name: Not on file  . Number of children: Not on file  . Years of education: Not on file  . Highest education level: Not on file  Social Needs  . Financial resource strain: Not on file  . Food insecurity - worry: Not on file  . Food insecurity - inability: Not on file  . Transportation needs - medical: Not on file  . Transportation needs - non-medical: Not on file  Occupational History  . Not on file  Tobacco Use  . Smoking status: Never Smoker  . Smokeless tobacco: Never Used  Substance and Sexual Activity  . Alcohol use: No    Alcohol/week: 0.0 oz  . Drug use: No  . Sexual activity: Not on file  Other Topics Concern  . Not on file  Social History Narrative  . Not on file   Past Surgical History:  Procedure Laterality Date  . CARDIAC SURGERY  2003   stent  . IMPLANTATION VAGAL NERVE STIMULATOR    . TRIGGER FINGER RELEASE    . TUMOR  REMOVAL  2002   S1 tumor removal by Dr. Annette Stable  . VAGAL NERVE STIMULATOR REMOVAL     Past Medical History:  Diagnosis Date  . Arthritis   . Diabetes mellitus   . High blood pressure   . Plantar fasciitis    There were no vitals taken for this visit.  Opioid Risk Score:   Fall Risk Score:  `1  Depression screen PHQ 2/9  Depression screen [facility] 2/9 07/04/2016 06/01/2016 04/02/2015 11/10/2014 10/13/2014 09/11/2014 05/12/2014  Decreased Interest 0 0 0 0 0 0 0  Down, Depressed, Hopeless 1 0 0 0 0 0 0  PHQ - 2 Score 1 0 0 0 0 0 0  Altered sleeping - - - - - - 0  Tired, decreased energy - - - - - - 0  Change in appetite - - - - - - 0  Feeling bad or failure about yourself  - - - - - - 0  Trouble concentrating - - - - - - 0  Moving slowly or fidgety/restless - - - - - - 0  Suicidal thoughts - - - - - - 0  PHQ-9 Score - - - - - - 0  Review of Systems  Constitutional: Negative.   HENT: Negative.   Eyes: Negative.   Respiratory: Negative.   Cardiovascular: Negative.   Gastrointestinal: Negative.   Endocrine:       Increased blood sugar   Genitourinary: Negative.   Musculoskeletal: Negative.   Skin: Negative.   Allergic/Immunologic: Negative.   Neurological: Negative.   Hematological: Negative.   Psychiatric/Behavioral: Negative.   All other systems reviewed and are negative.      Objective:   Physical Exam        Assessment & Plan:  1. Neuropathic foot pain secondary to schwannoma. Right S1 distribution, sensory deficits and pain. Medications including gabapentin 800 mg 4 times a day and oxycodone 15 mg 3 times a day are ineffective. Recent urine drug screen was appropriate on 07/04/2016.  2.  Chronic low back pain-increased with bending no signs of cauda equina syndrome  We'll continue current medications, Continue opioid monitoring program. This consists of regular clinic visits, examinations, urine drug screen, pill counts as well as use of [place]  controlled substance reporting System.

## 2017-01-29 ENCOUNTER — Encounter: Payer: Medicare Other | Admitting: Registered Nurse

## 2017-01-30 ENCOUNTER — Other Ambulatory Visit: Payer: Self-pay

## 2017-01-30 ENCOUNTER — Encounter: Payer: Self-pay | Admitting: Registered Nurse

## 2017-01-30 ENCOUNTER — Encounter: Payer: Medicare Other | Attending: Physical Medicine & Rehabilitation | Admitting: Registered Nurse

## 2017-01-30 VITALS — BP 138/76 | HR 60

## 2017-01-30 DIAGNOSIS — Z86018 Personal history of other benign neoplasm: Secondary | ICD-10-CM | POA: Diagnosis not present

## 2017-01-30 DIAGNOSIS — G8929 Other chronic pain: Secondary | ICD-10-CM | POA: Insufficient documentation

## 2017-01-30 DIAGNOSIS — Z79899 Other long term (current) drug therapy: Secondary | ICD-10-CM | POA: Diagnosis not present

## 2017-01-30 DIAGNOSIS — G5791 Unspecified mononeuropathy of right lower limb: Secondary | ICD-10-CM | POA: Diagnosis not present

## 2017-01-30 DIAGNOSIS — G894 Chronic pain syndrome: Secondary | ICD-10-CM

## 2017-01-30 DIAGNOSIS — E1149 Type 2 diabetes mellitus with other diabetic neurological complication: Secondary | ICD-10-CM

## 2017-01-30 DIAGNOSIS — M5418 Radiculopathy, sacral and sacrococcygeal region: Secondary | ICD-10-CM | POA: Diagnosis not present

## 2017-01-30 DIAGNOSIS — Z5181 Encounter for therapeutic drug level monitoring: Secondary | ICD-10-CM | POA: Diagnosis not present

## 2017-01-30 DIAGNOSIS — M545 Low back pain, unspecified: Secondary | ICD-10-CM

## 2017-01-30 DIAGNOSIS — E1342 Other specified diabetes mellitus with diabetic polyneuropathy: Secondary | ICD-10-CM | POA: Diagnosis not present

## 2017-01-30 DIAGNOSIS — Z760 Encounter for issue of repeat prescription: Secondary | ICD-10-CM | POA: Diagnosis not present

## 2017-01-30 DIAGNOSIS — M79671 Pain in right foot: Secondary | ICD-10-CM | POA: Insufficient documentation

## 2017-01-30 DIAGNOSIS — D3610 Benign neoplasm of peripheral nerves and autonomic nervous system, unspecified: Secondary | ICD-10-CM | POA: Insufficient documentation

## 2017-01-30 DIAGNOSIS — E1140 Type 2 diabetes mellitus with diabetic neuropathy, unspecified: Secondary | ICD-10-CM

## 2017-01-30 DIAGNOSIS — M792 Neuralgia and neuritis, unspecified: Secondary | ICD-10-CM

## 2017-01-30 MED ORDER — OXYCODONE HCL 15 MG PO TABS: 15.0000 mg | ORAL_TABLET | Freq: Three times a day (TID) | ORAL | 0 refills | Status: DC | PRN

## 2017-01-30 NOTE — Progress Notes (Signed)
Subjective:    Patient ID: Troy [name], male    DOB: 09-29-1945, 71 y.o.   MRN: [id number]  HPI:  Troy [name] is a 71year old male who returns for follow up appointmentfor chronic pain and medication refill. He states his pain is located in his right foot and occasionally lower back with activity. He rates his pain 8. His current exercise regime is walking ( mall walking 2- 3 times a week for 10 minutes) also performing stretching exercises and household chores.  Troy [name] Morphine equivalent is 67.50 MME  Troy [name] last UDS was on 07/04/2016 was consistent.    Pain Inventory Average Pain 8 Pain Right Now 8 My pain is aching  In the last 24 hours, has pain interfered with the following? General activity 7 Relation with others 7 Enjoyment of life 8 What TIME of day is your pain at its worst? daytime and evening, daytime Sleep (in general) Fair  Pain is worse with: walking and some activites Pain improves with: rest and medication Relief from Meds: .  Mobility walk without assistance how many minutes can you walk? 15 ability to climb steps?  yes do you drive?  yes Do you have any goals in this area?  yes  Function retired Do you have any goals in this area?  yes  Neuro/Psych numbness depression  Prior Studies Any changes since last visit?  no  Physicians involved in your care Any changes since last visit?  no   Family History  Problem Relation Age of Onset  . Hypertension Mother   . Diabetes Mother   . Stroke Mother        multiple  . Hypertension Father   . Stroke Father   . Stroke Sister   . Stroke Daughter    Social History   Socioeconomic History  . Marital status: Married    Spouse name: Not on file  . Number of children: Not on file  . Years of education: Not on file  . Highest education level: Not on file  Social Needs  . Financial resource strain: Not on file  . Food insecurity - worry: Not on file  . Food insecurity -  inability: Not on file  . Transportation needs - medical: Not on file  . Transportation needs - non-medical: Not on file  Occupational History  . Not on file  Tobacco Use  . Smoking status: Never Smoker  . Smokeless tobacco: Never Used  Substance and Sexual Activity  . Alcohol use: No    Alcohol/week: 0.0 oz  . Drug use: No  . Sexual activity: Not on file  Other Topics Concern  . Not on file  Social History Narrative  . Not on file   Past Surgical History:  Procedure Laterality Date  . CARDIAC SURGERY  2003   stent  . IMPLANTATION VAGAL NERVE STIMULATOR    . TRIGGER FINGER RELEASE    . TUMOR REMOVAL  2002   S1 tumor removal by Dr. Annette Stable  . VAGAL NERVE STIMULATOR REMOVAL     Past Medical History:  Diagnosis Date  . Arthritis   . Diabetes mellitus   . High blood pressure   . Plantar fasciitis    There were no vitals taken for this visit.  Opioid Risk Score:   Fall Risk Score:  `1  Depression screen PHQ 2/9  Depression screen [facility] 2/9 01/30/2017 07/04/2016 06/01/2016 04/02/2015 11/10/2014 10/13/2014 09/11/2014  Decreased Interest 0 0 0 0 0  0 0  Down, Depressed, Hopeless 0 1 0 0 0 0 0  PHQ - 2 Score 0 1 0 0 0 0 0  Altered sleeping - - - - - - -  Tired, decreased energy - - - - - - -  Change in appetite - - - - - - -  Feeling bad or failure about yourself  - - - - - - -  Trouble concentrating - - - - - - -  Moving slowly or fidgety/restless - - - - - - -  Suicidal thoughts - - - - - - -  PHQ-9 Score - - - - - - -     Review of Systems  Constitutional: Negative.   HENT: Negative.   Eyes: Negative.   Respiratory: Positive for apnea.   Cardiovascular: Negative.   Gastrointestinal: Negative.   Endocrine: Negative.   Genitourinary: Negative.   Musculoskeletal: Positive for arthralgias.  Skin: Negative.   Allergic/Immunologic: Negative.   Neurological: Positive for numbness.  Hematological: Negative.   Psychiatric/Behavioral: Positive for dysphoric mood.  All  other systems reviewed and are negative.      Objective:   Physical Exam  Constitutional: He is oriented to person, place, and time. He appears well-developed and well-nourished.  HENT:  Head: Normocephalic and atraumatic.  Neck: Normal range of motion. Neck supple.  Cardiovascular: Normal rate and regular rhythm.  Pulmonary/Chest: Effort normal and breath sounds normal.  Musculoskeletal:  Normal Muscle Bulk and Muscle Testing Reveals: Upper Extremities: Full  ROM and Muscle Strength 5/5 Back  without spinal tenderness noted Lower Extremities: Full ROM and Muscle Strength 5/5 Arises from Table with ease Narrow Based Gait  Neurological: He is alert and oriented to person, place, and time.  Skin: Skin is warm and dry.  Psychiatric: He has a normal mood and affect.  Nursing note and vitals reviewed.         Assessment & Plan:  1. 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  We will continue the opioid monitoring program, this consists of regular clinic visits, examinations, urine drug screen, pill counts as well as use of [place] Controlled Substance Reporting System. 2. Diabetic peripheral neuropathy affecting both feet: Continue Gabapentin. 01/30/2017 3. Chronic Bilateral Low Back Pain: Continue with HEP as Tolerated. Continue current medication regimen. 01/30/2017  15 minutes of face to face patient care time was spent during this visit. All questions were encouraged and answered.  F/U in 1 month

## 2017-02-27 ENCOUNTER — Other Ambulatory Visit: Payer: Self-pay

## 2017-02-27 ENCOUNTER — Encounter: Payer: Medicare Other | Attending: Physical Medicine & Rehabilitation | Admitting: Registered Nurse

## 2017-02-27 ENCOUNTER — Encounter: Payer: Self-pay | Admitting: Registered Nurse

## 2017-02-27 VITALS — BP 144/76 | HR 64

## 2017-02-27 DIAGNOSIS — M5418 Radiculopathy, sacral and sacrococcygeal region: Secondary | ICD-10-CM | POA: Diagnosis not present

## 2017-02-27 DIAGNOSIS — G894 Chronic pain syndrome: Secondary | ICD-10-CM | POA: Diagnosis not present

## 2017-02-27 DIAGNOSIS — D3610 Benign neoplasm of peripheral nerves and autonomic nervous system, unspecified: Secondary | ICD-10-CM | POA: Diagnosis not present

## 2017-02-27 DIAGNOSIS — E1342 Other specified diabetes mellitus with diabetic polyneuropathy: Secondary | ICD-10-CM | POA: Diagnosis not present

## 2017-02-27 DIAGNOSIS — M545 Low back pain, unspecified: Secondary | ICD-10-CM

## 2017-02-27 DIAGNOSIS — Z5181 Encounter for therapeutic drug level monitoring: Secondary | ICD-10-CM

## 2017-02-27 DIAGNOSIS — E1149 Type 2 diabetes mellitus with other diabetic neurological complication: Secondary | ICD-10-CM | POA: Diagnosis not present

## 2017-02-27 DIAGNOSIS — Z79899 Other long term (current) drug therapy: Secondary | ICD-10-CM | POA: Diagnosis not present

## 2017-02-27 DIAGNOSIS — G5791 Unspecified mononeuropathy of right lower limb: Secondary | ICD-10-CM | POA: Diagnosis not present

## 2017-02-27 DIAGNOSIS — Z760 Encounter for issue of repeat prescription: Secondary | ICD-10-CM | POA: Diagnosis not present

## 2017-02-27 DIAGNOSIS — E1140 Type 2 diabetes mellitus with diabetic neuropathy, unspecified: Secondary | ICD-10-CM

## 2017-02-27 DIAGNOSIS — M792 Neuralgia and neuritis, unspecified: Secondary | ICD-10-CM

## 2017-02-27 DIAGNOSIS — Z86018 Personal history of other benign neoplasm: Secondary | ICD-10-CM

## 2017-02-27 DIAGNOSIS — G8929 Other chronic pain: Secondary | ICD-10-CM | POA: Diagnosis not present

## 2017-02-27 DIAGNOSIS — M79671 Pain in right foot: Secondary | ICD-10-CM | POA: Diagnosis present

## 2017-02-27 MED ORDER — OXYCODONE HCL 15 MG PO TABS: 15.0000 mg | ORAL_TABLET | Freq: Three times a day (TID) | ORAL | 0 refills | Status: DC | PRN

## 2017-02-27 NOTE — Progress Notes (Signed)
Subjective:    Patient ID: Troy [name], male    DOB: [birth year], 72 y.o.   MRN: [id number]  HPI:  Troy [name] is a 72year old male who returns for follow up appointmentfor chronic pain and medication refill. He states his pain is located in his right foot and occasionally lower back with activity. He rates his pain 8. His current exercise regime is walking and performing stretching exercises and household chores.  Troy [name] is 69.75 MME.  Troy [name] was on 07/04/2016 was consistent.    Pain Inventory Average Pain 8 Pain Right Now 8 My pain is aching  In the last 24 hours, has pain interfered with the following? General activity 8 Relation with others 7 Enjoyment of life 8 What TIME of day is your pain at its worst? morning and evening, daytime Sleep (in general) Fair  Pain is worse with: bending Pain improves with: rest and medication Relief from Meds: 8  Mobility walk without assistance how many minutes can you walk? 20 ability to climb steps?  yes do you drive?  yes Do you have any goals in this area?  yes  Function retired Do you have any goals in this area?  yes  Neuro/Psych numbness depression  Prior Studies Any changes since last visit?  no  Physicians involved in your care Any changes since last visit?  no   Family History  Problem Relation Age of Onset  . Hypertension Mother   . Diabetes Mother   . Stroke Mother        multiple  . Hypertension Father   . Stroke Father   . Stroke Sister   . Stroke Daughter    Social History   Socioeconomic History  . Marital status: Married    Spouse name: None  . Number of children: None  . Years of education: None  . Highest education level: None  Social Needs  . Financial resource strain: None  . Food insecurity - worry: None  . Food insecurity - inability: None  . Transportation needs - medical: None  . Transportation needs - non-medical: None  Occupational  History  . None  Tobacco Use  . Smoking status: Never Smoker  . Smokeless tobacco: Never Used  Substance and Sexual Activity  . Alcohol use: No    Alcohol/week: 0.0 oz  . Drug use: No  . Sexual activity: None  Other Topics Concern  . None  Social History Narrative  . None   Past Surgical History:  Procedure Laterality Date  . CARDIAC SURGERY  2003   stent  . IMPLANTATION VAGAL NERVE STIMULATOR    . TRIGGER FINGER RELEASE    . TUMOR REMOVAL  2002   S1 tumor removal by Dr. Annette Stable  . VAGAL NERVE STIMULATOR REMOVAL     Past Medical History:  Diagnosis Date  . Arthritis   . Diabetes mellitus   . High blood pressure   . Plantar fasciitis    BP (!) 144/76   Pulse 64 Comment: apical  SpO2 98%   Opioid Risk Score:   Fall Risk Score:  `1  Depression screen PHQ 2/9  Depression screen [facility] 2/9 02/27/2017 01/30/2017 07/04/2016 06/01/2016 04/02/2015 11/10/2014 10/13/2014  Decreased Interest 0 0 0 0 0 0 0  Down, Depressed, Hopeless 0 0 1 0 0 0 0  PHQ - 2 Score 0 0 1 0 0 0 0  Altered sleeping - - - - - - -  Tired, decreased energy - - - - - - -  Change in appetite - - - - - - -  Feeling bad or failure about yourself  - - - - - - -  Trouble concentrating - - - - - - -  Moving slowly or fidgety/restless - - - - - - -  Suicidal thoughts - - - - - - -  PHQ-9 Score - - - - - - -     Review of Systems  Constitutional: Negative.   HENT: Negative.   Eyes: Negative.   Respiratory: Positive for apnea.   Cardiovascular: Negative.   Gastrointestinal: Negative.   Endocrine: Negative.   Genitourinary: Negative.   Musculoskeletal: Positive for arthralgias.  Skin: Negative.   Allergic/Immunologic: Negative.   Neurological: Positive for numbness.  Hematological: Negative.   Psychiatric/Behavioral: Positive for dysphoric mood.  All other systems reviewed and are negative.      Objective:   Physical Exam  Constitutional: He is oriented to person, place, and time. He appears  well-developed and well-nourished.  HENT:  Head: Normocephalic and atraumatic.  Neck: Normal range of motion. Neck supple.  Cardiovascular: Normal rate and regular rhythm.  Pulmonary/Chest: Effort normal and breath sounds normal.  Musculoskeletal:  Normal Muscle Bulk and Muscle Testing Reveals: Upper Extremities: Full  ROM and Muscle Strength 5/5 Back  without spinal tenderness noted Lower Extremities: Full ROM and Muscle Strength 5/5 Arises from Table with ease Narrow Based Gait  Neurological: He is alert and oriented to person, place, and time.  Skin: Skin is warm and dry.  Psychiatric: He has a normal mood and affect.  Nursing note and vitals reviewed.         Assessment & Plan:  1. 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  We will continue the opioid monitoring program, this consists of regular clinic visits, examinations, urine drug screen, pill counts as well as use of [place] Controlled Substance Reporting System. 2. Diabetic peripheral neuropathy affecting both feet: Continue Gabapentin. 02/27/2017 3. Chronic Bilateral Low Back Pain: Continue with HEP as Tolerated. Continue current medication regimen. 02/27/2017  15 minutes of face to face patient care time was spent during this visit. All questions were encouraged and answered.  F/U in 1 month

## 2017-04-03 ENCOUNTER — Encounter: Payer: Self-pay | Admitting: Registered Nurse

## 2017-04-03 ENCOUNTER — Encounter: Payer: Medicare Other | Attending: Physical Medicine & Rehabilitation | Admitting: Registered Nurse

## 2017-04-03 VITALS — BP 129/62 | HR 69

## 2017-04-03 DIAGNOSIS — E1149 Type 2 diabetes mellitus with other diabetic neurological complication: Secondary | ICD-10-CM

## 2017-04-03 DIAGNOSIS — M545 Low back pain, unspecified: Secondary | ICD-10-CM

## 2017-04-03 DIAGNOSIS — G8929 Other chronic pain: Secondary | ICD-10-CM | POA: Insufficient documentation

## 2017-04-03 DIAGNOSIS — E1342 Other specified diabetes mellitus with diabetic polyneuropathy: Secondary | ICD-10-CM | POA: Diagnosis not present

## 2017-04-03 DIAGNOSIS — M79671 Pain in right foot: Secondary | ICD-10-CM | POA: Diagnosis present

## 2017-04-03 DIAGNOSIS — Z760 Encounter for issue of repeat prescription: Secondary | ICD-10-CM | POA: Insufficient documentation

## 2017-04-03 DIAGNOSIS — D3610 Benign neoplasm of peripheral nerves and autonomic nervous system, unspecified: Secondary | ICD-10-CM | POA: Insufficient documentation

## 2017-04-03 DIAGNOSIS — G894 Chronic pain syndrome: Secondary | ICD-10-CM | POA: Diagnosis not present

## 2017-04-03 DIAGNOSIS — Z86018 Personal history of other benign neoplasm: Secondary | ICD-10-CM | POA: Diagnosis not present

## 2017-04-03 DIAGNOSIS — E1140 Type 2 diabetes mellitus with diabetic neuropathy, unspecified: Secondary | ICD-10-CM

## 2017-04-03 DIAGNOSIS — G5791 Unspecified mononeuropathy of right lower limb: Secondary | ICD-10-CM | POA: Diagnosis not present

## 2017-04-03 DIAGNOSIS — M792 Neuralgia and neuritis, unspecified: Secondary | ICD-10-CM

## 2017-04-03 DIAGNOSIS — Z5181 Encounter for therapeutic drug level monitoring: Secondary | ICD-10-CM | POA: Diagnosis not present

## 2017-04-03 DIAGNOSIS — Z79899 Other long term (current) drug therapy: Secondary | ICD-10-CM

## 2017-04-03 DIAGNOSIS — M5418 Radiculopathy, sacral and sacrococcygeal region: Secondary | ICD-10-CM | POA: Diagnosis not present

## 2017-04-03 MED ORDER — OXYCODONE HCL 15 MG PO TABS: 15.0000 mg | ORAL_TABLET | Freq: Three times a day (TID) | ORAL | 0 refills | Status: DC | PRN

## 2017-04-03 NOTE — Progress Notes (Signed)
Subjective:    Patient ID: Troy [name], male    DOB: 1946/01/14, 72 y.o.   MRN: [id number]  HPI: [name] is a 72year old male who returns for follow up appointmentfor chronic pain and medication refill. He states his pain is located in right foot and occasionally lower back with activity. He rates his pain 9. His current exercise regime is walking and performing stretching exercises and performing light household chores.    [name] Morphine equivalent is 65.25 MME.  [name] last UDS was on 07/04/2016 was consistent.   Pain Inventory Average Pain 8 Pain Right Now 9 My pain is constant and aching  In the last 24 hours, has pain interfered with the following? General activity 8 Relation with others 8 Enjoyment of life 9 What TIME of day is your pain at its worst? Morning, daytime, evening  Sleep (in general) Fair  Pain is worse with: bending and some activites Pain improves with: rest and medication Relief from Meds: 7  Mobility how many minutes can you walk? 20 ability to climb steps?  yes do you drive?  yes Do you have any goals in this area?  yes  Function retired Do you have any goals in this area?  yes  Neuro/Psych depression  Prior Studies Any changes since last visit?  no  Physicians involved in your care Any changes since last visit?  no   Family History  Problem Relation Age of Onset  . Hypertension Mother   . Diabetes Mother   . Stroke Mother        multiple  . Hypertension Father   . Stroke Father   . Stroke Sister   . Stroke Daughter    Social History   Socioeconomic History  . Marital status: Married    Spouse name: Not on file  . Number of children: Not on file  . Years of education: Not on file  . Highest education level: Not on file  Social Needs  . Financial resource strain: Not on file  . Food insecurity - worry: Not on file  . Food insecurity - inability: Not on file  . Transportation needs - medical: Not on  file  . Transportation needs - non-medical: Not on file  Occupational History  . Not on file  Tobacco Use  . Smoking status: Never Smoker  . Smokeless tobacco: Never Used  Substance and Sexual Activity  . Alcohol use: No    Alcohol/week: 0.0 oz  . Drug use: No  . Sexual activity: Not on file  Other Topics Concern  . Not on file  Social History Narrative  . Not on file   Past Surgical History:  Procedure Laterality Date  . CARDIAC SURGERY  2003   stent  . IMPLANTATION VAGAL NERVE STIMULATOR    . TRIGGER FINGER RELEASE    . TUMOR REMOVAL  2002   S1 tumor removal by Dr. Annette Stable  . VAGAL NERVE STIMULATOR REMOVAL     Past Medical History:  Diagnosis Date  . Arthritis   . Diabetes mellitus   . High blood pressure   . Plantar fasciitis    There were no vitals taken for this visit.  Opioid Risk Score:   Fall Risk Score:  `1  Depression screen PHQ 2/9  Depression screen [facility] 2/9 02/27/2017 01/30/2017 07/04/2016 06/01/2016 04/02/2015 11/10/2014 10/13/2014  Decreased Interest 0 0 0 0 0 0 0  Down, Depressed, Hopeless 0 0 1 0 0 0  0  PHQ - 2 Score 0 0 1 0 0 0 0  Altered sleeping - - - - - - -  Tired, decreased energy - - - - - - -  Change in appetite - - - - - - -  Feeling bad or failure about yourself  - - - - - - -  Trouble concentrating - - - - - - -  Moving slowly or fidgety/restless - - - - - - -  Suicidal thoughts - - - - - - -  PHQ-9 Score - - - - - - -     Review of Systems  Constitutional:       Hyperglycemia   Respiratory: Positive for apnea.   Psychiatric/Behavioral: Positive for dysphoric mood.       Objective:   Physical Exam  Constitutional: He is oriented to person, place, and time. He appears well-developed and well-nourished.  HENT:  Head: Normocephalic and atraumatic.  Neck: Normal range of motion. Neck supple.  Cardiovascular: Normal rate and regular rhythm.  Pulmonary/Chest: Effort normal and breath sounds normal.  Musculoskeletal:  Normal Muscle  Bulk and Muscle Testing Reveals: Upper Extremities: Full ROM and Muscle Strength 5/5 Back without spinal tenderness noted Lower Extremities: Full ROM and Muscle Strength 5/5 Arises from chair with ease Narrow Based Gait   Neurological: He is alert and oriented to person, place, and time.  Skin: Skin is warm and dry.  Psychiatric: He has a normal mood and affect.  Nursing note and vitals reviewed.         Assessment & Plan:  1. 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  We will continue the opioid monitoring program, this consists of regular clinic visits, examinations, urine drug screen, pill counts as well as use of [place] Controlled Substance Reporting System. 2. Diabetic peripheral neuropathy affecting both feet: Continue Gabapentin. 04/03/2017 3. Chronic Bilateral Low Back Pain: Continue with HEP as Tolerated. Continue current medication regimen. 04/03/2017  15 minutes of face to face patient care time was spent during this visit. All questions were encouraged and answered.  F/U in 1 month

## 2017-04-30 ENCOUNTER — Encounter: Payer: Self-pay | Admitting: Registered Nurse

## 2017-04-30 ENCOUNTER — Encounter: Payer: Medicare Other | Attending: Physical Medicine & Rehabilitation | Admitting: Registered Nurse

## 2017-04-30 ENCOUNTER — Other Ambulatory Visit: Payer: Self-pay

## 2017-04-30 VITALS — BP 150/70 | HR 86

## 2017-04-30 DIAGNOSIS — G8929 Other chronic pain: Secondary | ICD-10-CM | POA: Diagnosis not present

## 2017-04-30 DIAGNOSIS — Z79891 Long term (current) use of opiate analgesic: Secondary | ICD-10-CM

## 2017-04-30 DIAGNOSIS — D3610 Benign neoplasm of peripheral nerves and autonomic nervous system, unspecified: Secondary | ICD-10-CM | POA: Diagnosis not present

## 2017-04-30 DIAGNOSIS — Z86018 Personal history of other benign neoplasm: Secondary | ICD-10-CM

## 2017-04-30 DIAGNOSIS — M79671 Pain in right foot: Secondary | ICD-10-CM | POA: Insufficient documentation

## 2017-04-30 DIAGNOSIS — E1140 Type 2 diabetes mellitus with diabetic neuropathy, unspecified: Secondary | ICD-10-CM

## 2017-04-30 DIAGNOSIS — E1149 Type 2 diabetes mellitus with other diabetic neurological complication: Secondary | ICD-10-CM | POA: Diagnosis not present

## 2017-04-30 DIAGNOSIS — Z760 Encounter for issue of repeat prescription: Secondary | ICD-10-CM | POA: Insufficient documentation

## 2017-04-30 DIAGNOSIS — E1342 Other specified diabetes mellitus with diabetic polyneuropathy: Secondary | ICD-10-CM | POA: Insufficient documentation

## 2017-04-30 DIAGNOSIS — Z5181 Encounter for therapeutic drug level monitoring: Secondary | ICD-10-CM

## 2017-04-30 DIAGNOSIS — M5418 Radiculopathy, sacral and sacrococcygeal region: Secondary | ICD-10-CM | POA: Insufficient documentation

## 2017-04-30 DIAGNOSIS — M545 Low back pain, unspecified: Secondary | ICD-10-CM

## 2017-04-30 DIAGNOSIS — G894 Chronic pain syndrome: Secondary | ICD-10-CM

## 2017-04-30 MED ORDER — OXYCODONE HCL 15 MG PO TABS: 15.0000 mg | ORAL_TABLET | Freq: Three times a day (TID) | ORAL | 0 refills | Status: DC | PRN

## 2017-04-30 NOTE — Progress Notes (Signed)
Subjective:    Patient ID: Troy [name], male    DOB: 07/24/1945, 72 y.o.   MRN: [id number]  HPI: [name] is a 72year old male who returns for follow up appointmentfor chronic pain and medication refill. He states his pain is located in his right foot and occasionally in his lower back with activity. He rates his pain 8.His current exercise regime is walking and performing stretching exercises and performing light household chores.    Mr. Carton Morphine equivalent is 72.00 MME.  Mr. Hillyard last UDS was on 07/04/2016 was consistent. UDS ordered today.  Pain Inventory Average Pain 8 Pain Right Now 8 My pain is constant and aching  In the last 24 hours, has pain interfered with the following? General activity 8 Relation with others 7 Enjoyment of life 8 What TIME of day is your pain at its worst? Morning, daytime, evening  Sleep (in general) Fair  Pain is worse with: bending and some activites Pain improves with: rest and medication Relief from Meds: 8  Mobility how many minutes can you walk? 20 ability to climb steps?  yes do you drive?  yes Do you have any goals in this area?  yes  Function retired Do you have any goals in this area?  yes  Neuro/Psych numbness depression  Prior Studies Any changes since last visit?  no  Physicians involved in your care Any changes since last visit?  no   Family History  Problem Relation Age of Onset  . Hypertension Mother   . Diabetes Mother   . Stroke Mother        multiple  . Hypertension Father   . Stroke Father   . Stroke Sister   . Stroke Daughter    Social History   Socioeconomic History  . Marital status: Married    Spouse name: Not on file  . Number of children: Not on file  . Years of education: Not on file  . Highest education level: Not on file  Social Needs  . Financial resource strain: Not on file  . Food insecurity - worry: Not on file  . Food insecurity - inability: Not on file  .  Transportation needs - medical: Not on file  . Transportation needs - non-medical: Not on file  Occupational History  . Not on file  Tobacco Use  . Smoking status: Never Smoker  . Smokeless tobacco: Never Used  Substance and Sexual Activity  . Alcohol use: No    Alcohol/week: 0.0 oz  . Drug use: No  . Sexual activity: Not on file  Other Topics Concern  . Not on file  Social History Narrative  . Not on file   Past Surgical History:  Procedure Laterality Date  . CARDIAC SURGERY  2003   stent  . IMPLANTATION VAGAL NERVE STIMULATOR    . TRIGGER FINGER RELEASE    . TUMOR REMOVAL  2002   S1 tumor removal by Dr. Annette Stable  . VAGAL NERVE STIMULATOR REMOVAL     Past Medical History:  Diagnosis Date  . Arthritis   . Diabetes mellitus   . High blood pressure   . Plantar fasciitis    BP (!) 150/70   Pulse 86   SpO2 96%   Opioid Risk Score:  1 Fall Risk Score:  `1  Depression screen PHQ 2/9  Depression screen [facility] 2/9 04/30/2017 02/27/2017 01/30/2017 07/04/2016 06/01/2016 04/02/2015 11/10/2014  Decreased Interest 0 0 0 0 0 0 0  Down, Depressed, Hopeless 0 0 0 1 0 0 0  PHQ - 2 Score 0 0 0 1 0 0 0  Altered sleeping - - - - - - -  Tired, decreased energy - - - - - - -  Change in appetite - - - - - - -  Feeling bad or failure about yourself  - - - - - - -  Trouble concentrating - - - - - - -  Moving slowly or fidgety/restless - - - - - - -  Suicidal thoughts - - - - - - -  PHQ-9 Score - - - - - - -     Review of Systems  Constitutional:       Hyperglycemia   Respiratory: Positive for apnea.   Endocrine:       High blood sugar  Psychiatric/Behavioral: Positive for dysphoric mood.       Objective:   Physical Exam  Constitutional: He is oriented to person, place, and time. He appears well-developed and well-nourished.  HENT:  Head: Normocephalic and atraumatic.  Neck: Normal range of motion. Neck supple.  Cardiovascular: Normal rate and regular rhythm.  Pulmonary/Chest:  Effort normal and breath sounds normal.  Musculoskeletal:  Normal Muscle Bulk and Muscle Testing Reveals: Upper Extremities: Full  ROM and Muscle Strength 5/5 Back without paraspinal tenderness noted Lower Extremities: Full  ROM and Muscle Strength 5/5 Arises from chair with ease Narrow Based Gait   Neurological: He is alert and oriented to person, place, and time.  Skin: Skin is warm and dry.  Psychiatric: He has a normal mood and affect.  Nursing note and vitals reviewed.         Assessment & Plan:  1. 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  We will continue the opioid monitoring program, this consists of regular clinic visits, examinations, urine drug screen, pill counts as well as use of [place] Controlled Substance Reporting System. 2. Diabetic peripheral neuropathy affecting both feet: Continue Gabapentin. 04/30/2017 3. Chronic Bilateral Low Back Pain: Continue with HEP as Tolerated. Continue current medication regimen. 04/30/2017  15 minutes of face to face patient care time was spent during this visit. All questions were encouraged and answered.  F/U in 1 month

## 2017-05-05 LAB — TOXASSURE SELECT,+ANTIDEPR,UR

## 2017-05-08 ENCOUNTER — Telehealth: Payer: Self-pay | Admitting: *Deleted

## 2017-05-08 NOTE — Telephone Encounter (Signed)
Urine drug screen for this encounter is consistent for prescribed medication 

## 2017-05-29 ENCOUNTER — Encounter: Payer: Self-pay | Admitting: Registered Nurse

## 2017-05-29 ENCOUNTER — Encounter: Payer: Medicare Other | Attending: Physical Medicine & Rehabilitation | Admitting: Registered Nurse

## 2017-05-29 VITALS — BP 158/60 | HR 73 | Resp 14 | Ht 67.0 in | Wt 162.0 lb

## 2017-05-29 DIAGNOSIS — M79671 Pain in right foot: Secondary | ICD-10-CM | POA: Insufficient documentation

## 2017-05-29 DIAGNOSIS — M545 Low back pain, unspecified: Secondary | ICD-10-CM

## 2017-05-29 DIAGNOSIS — Z5181 Encounter for therapeutic drug level monitoring: Secondary | ICD-10-CM

## 2017-05-29 DIAGNOSIS — G894 Chronic pain syndrome: Secondary | ICD-10-CM

## 2017-05-29 DIAGNOSIS — E1342 Other specified diabetes mellitus with diabetic polyneuropathy: Secondary | ICD-10-CM | POA: Diagnosis not present

## 2017-05-29 DIAGNOSIS — G8929 Other chronic pain: Secondary | ICD-10-CM | POA: Diagnosis present

## 2017-05-29 DIAGNOSIS — E1140 Type 2 diabetes mellitus with diabetic neuropathy, unspecified: Secondary | ICD-10-CM | POA: Diagnosis not present

## 2017-05-29 DIAGNOSIS — E1149 Type 2 diabetes mellitus with other diabetic neurological complication: Secondary | ICD-10-CM | POA: Diagnosis not present

## 2017-05-29 DIAGNOSIS — Z79891 Long term (current) use of opiate analgesic: Secondary | ICD-10-CM | POA: Diagnosis not present

## 2017-05-29 DIAGNOSIS — Z760 Encounter for issue of repeat prescription: Secondary | ICD-10-CM | POA: Insufficient documentation

## 2017-05-29 DIAGNOSIS — D3610 Benign neoplasm of peripheral nerves and autonomic nervous system, unspecified: Secondary | ICD-10-CM | POA: Diagnosis not present

## 2017-05-29 DIAGNOSIS — Z86018 Personal history of other benign neoplasm: Secondary | ICD-10-CM

## 2017-05-29 DIAGNOSIS — M5418 Radiculopathy, sacral and sacrococcygeal region: Secondary | ICD-10-CM | POA: Insufficient documentation

## 2017-05-29 MED ORDER — OXYCODONE HCL 15 MG PO TABS: 15.0000 mg | ORAL_TABLET | Freq: Three times a day (TID) | ORAL | 0 refills | Status: DC | PRN

## 2017-05-29 NOTE — Progress Notes (Signed)
Subjective:    Patient ID: Troy [name], male    DOB: 10-Apr-1945, 72 y.o.   MRN: [id number]  HPI: Troy [name] is a 72 year old male who returns fo follow up appointment and medication refill. He states his pain is located in his right foot and lower back with activity. He rates his pain 8. His current exercise regime is walking, performing stretching exercises and performing light household chores.   Troy [name] is 74.25.   Troy [name] last UDS was on 04/30/2017 it was consistent.    Pain Inventory Average Pain 7 Pain Right Now 8 My pain is aching  In the last 24 hours, has pain interfered with the following? General activity 8 Relation with others 7 Enjoyment of life 8 What TIME of day is your pain at its worst? morning, daytime Sleep (in general) Good  Pain is worse with: bending and some activites Pain improves with: rest and medication Relief from Meds: 8  Mobility walk without assistance how many minutes can you walk? 20 ability to climb steps?  yes do you drive?  yes Do you have any goals in this area?  yes  Function retired Do you have any goals in this area?  yes  Neuro/Psych numbness depression  Prior Studies Any changes since last visit?  no  Physicians involved in your care Any changes since last visit?  no   Family History  Problem Relation Age of Onset  . Hypertension Mother   . Diabetes Mother   . Stroke Mother        multiple  . Hypertension Father   . Stroke Father   . Stroke Sister   . Stroke Daughter    Social History   Socioeconomic History  . Marital status: Married    Spouse name: Not on file  . Number of children: Not on file  . Years of education: Not on file  . Highest education level: Not on file  Occupational History  . Not on file  Social Needs  . Financial resource strain: Not on file  . Food insecurity:    Worry: Not on file    Inability: Not on file  . Transportation needs:    Medical:  Not on file    Non-medical: Not on file  Tobacco Use  . Smoking status: Never Smoker  . Smokeless tobacco: Never Used  Substance and Sexual Activity  . Alcohol use: No    Alcohol/week: 0.0 oz  . Drug use: No  . Sexual activity: Not on file  Lifestyle  . Physical activity:    Days per week: Not on file    Minutes per session: Not on file  . Stress: Not on file  Relationships  . Social connections:    Talks on phone: Not on file    Gets together: Not on file    Attends religious service: Not on file    Active member of club or organization: Not on file    Attends meetings of clubs or organizations: Not on file    Relationship status: Not on file  Other Topics Concern  . Not on file  Social History Narrative  . Not on file   Past Surgical History:  Procedure Laterality Date  . CARDIAC SURGERY  2003   stent  . IMPLANTATION VAGAL NERVE STIMULATOR    . TRIGGER FINGER RELEASE    . TUMOR REMOVAL  2002   S1 tumor removal by Troy [name]  .  VAGAL NERVE STIMULATOR REMOVAL     Past Medical History:  Diagnosis Date  . Arthritis   . Diabetes mellitus   . High blood pressure   . Plantar fasciitis    BP (!) 158/60 (BP Location: Left Arm, Patient Position: Sitting, Cuff Size: Normal)   Pulse 73   Resp 14   Ht 5\' 7"  (1.702 m)   Wt 162 lb (73.5 kg)   SpO2 97%   BMI 25.37 kg/m   Opioid Risk Score:   Fall Risk Score:  `1  Depression screen PHQ 2/9  Depression screen [facility] 2/9 04/30/2017 02/27/2017 01/30/2017 07/04/2016 06/01/2016 04/02/2015 11/10/2014  Decreased Interest 0 0 0 0 0 0 0  Down, Depressed, Hopeless 0 0 0 1 0 0 0  PHQ - 2 Score 0 0 0 1 0 0 0  Altered sleeping - - - - - - -  Tired, decreased energy - - - - - - -  Change in appetite - - - - - - -  Feeling bad or failure about yourself  - - - - - - -  Trouble concentrating - - - - - - -  Moving slowly or fidgety/restless - - - - - - -  Suicidal thoughts - - - - - - -  PHQ-9 Score - - - - - - -    Review of Systems    Constitutional: Negative.   HENT: Negative.   Eyes: Negative.   Respiratory: Positive for apnea.   Cardiovascular: Negative.   Gastrointestinal: Negative.   Endocrine:       High blood sugar  Genitourinary: Negative.   Musculoskeletal: Negative.   Skin: Negative.   Allergic/Immunologic: Negative.   Neurological: Positive for numbness.  Psychiatric/Behavioral: Positive for dysphoric mood.       Objective:   Physical Exam  Constitutional: He is oriented to person, place, and time. He appears well-developed and well-nourished.  HENT:  Head: Normocephalic and atraumatic.  Neck: Normal range of motion. Neck supple.  Cardiovascular: Normal rate and regular rhythm.  Pulmonary/Chest: Effort normal and breath sounds normal.  Musculoskeletal:  Normal Muscle Bulk and Muscle Testing Reveals:  Upper Extremities: Full ROM and Muscle Strength 5/5 Lower Extremities: Full ROM and Muscle Strength 5/5 Arises from chair with ease Narrow Based Gait  Neurological: He is alert and oriented to person, place, and time.  Skin: Skin is warm and dry.  Psychiatric: He has a normal mood and affect.  Nursing note and vitals reviewed.         Assessment & Plan:  1. 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  We will continue the opioid monitoring program, this consists of regular clinic visits, examinations, urine drug screen, pill counts as well as use of [place] Controlled Substance Reporting System. 2. Diabetic peripheral neuropathy affecting both feet: Continue with Gabapentin PCP Following. 05/29/2017 3. Chronic Bilateral Low Back Pain: Continue current treatment regimen with HEP as Tolerated. Continue current medication regimen.05/29/2017  15 minutes of face to face patient care time was spent during this visit. All questions were encouraged and  answered.  F/U in 1 month

## 2017-06-26 ENCOUNTER — Encounter: Payer: Medicare Other | Admitting: Registered Nurse

## 2017-07-02 ENCOUNTER — Encounter: Payer: Self-pay | Admitting: Registered Nurse

## 2017-07-02 ENCOUNTER — Encounter: Payer: Medicare Other | Attending: Physical Medicine & Rehabilitation | Admitting: Registered Nurse

## 2017-07-02 VITALS — BP 134/54 | HR 67 | Ht 67.0 in | Wt 156.4 lb

## 2017-07-02 DIAGNOSIS — E1140 Type 2 diabetes mellitus with diabetic neuropathy, unspecified: Secondary | ICD-10-CM | POA: Diagnosis not present

## 2017-07-02 DIAGNOSIS — Z86018 Personal history of other benign neoplasm: Secondary | ICD-10-CM

## 2017-07-02 DIAGNOSIS — Z760 Encounter for issue of repeat prescription: Secondary | ICD-10-CM | POA: Insufficient documentation

## 2017-07-02 DIAGNOSIS — M79671 Pain in right foot: Secondary | ICD-10-CM | POA: Insufficient documentation

## 2017-07-02 DIAGNOSIS — G894 Chronic pain syndrome: Secondary | ICD-10-CM | POA: Diagnosis not present

## 2017-07-02 DIAGNOSIS — M5418 Radiculopathy, sacral and sacrococcygeal region: Secondary | ICD-10-CM | POA: Insufficient documentation

## 2017-07-02 DIAGNOSIS — Z5181 Encounter for therapeutic drug level monitoring: Secondary | ICD-10-CM

## 2017-07-02 DIAGNOSIS — G8929 Other chronic pain: Secondary | ICD-10-CM | POA: Insufficient documentation

## 2017-07-02 DIAGNOSIS — E1149 Type 2 diabetes mellitus with other diabetic neurological complication: Secondary | ICD-10-CM

## 2017-07-02 DIAGNOSIS — E1342 Other specified diabetes mellitus with diabetic polyneuropathy: Secondary | ICD-10-CM | POA: Insufficient documentation

## 2017-07-02 DIAGNOSIS — D3610 Benign neoplasm of peripheral nerves and autonomic nervous system, unspecified: Secondary | ICD-10-CM | POA: Diagnosis not present

## 2017-07-02 DIAGNOSIS — Z79891 Long term (current) use of opiate analgesic: Secondary | ICD-10-CM

## 2017-07-02 MED ORDER — OXYCODONE HCL 15 MG PO TABS: 15.0000 mg | ORAL_TABLET | Freq: Three times a day (TID) | ORAL | 0 refills | Status: DC | PRN

## 2017-07-02 NOTE — Progress Notes (Signed)
Subjective:    Patient ID: Troy [name], male    DOB: 04-Mar-1945, 72 y.o.   MRN: [id number]  HPI: [name] is a 72 year old male who returns for follow up appointment for chronic pain and medication refill. He states his pain is located in his right foot. Also reports he has generalized pain all over. He rates his pain 8. His current exercise regime is walking. Also states last week he had vomiting for 5-7 days, he seen his PCP and PCP following. Also reports he has been experiencing hypoglycemia, he reports his am blood sugar was 58, he had orange juice and glucose tablet. He didn't re-check his blood sugar he was instructed to call his endocrinologist, he verbalizes understanding. Also states he will call today.   [name] Morphine Equivalent is 58.50 MME. Last UDS was Performed on 04/30/2017, it was consistent.    Pain Inventory Average Pain 8 Pain Right Now 8 My pain is aching and .  In the last 24 hours, has pain interfered with the following? General activity 7 Relation with others 7 Enjoyment of life 7 What TIME of day is your pain at its worst? daytime Sleep (in general) Fair  Pain is worse with: bending and some activites Pain improves with: rest and medication Relief from Meds: 8  Mobility walk without assistance ability to climb steps?  yes do you drive?  yes  Function retired  Neuro/Psych bowel control problems numbness depression  Prior Studies Any changes since last visit?  no  Physicians involved in your care Any changes since last visit?  no   Family History  Problem Relation Age of Onset  . Hypertension Mother   . Diabetes Mother   . Stroke Mother        multiple  . Hypertension Father   . Stroke Father   . Stroke Sister   . Stroke Daughter    Social History   Socioeconomic History  . Marital status: Married    Spouse name: Not on file  . Number of children: Not on file  . Years of education: Not on file  . Highest  education level: Not on file  Occupational History  . Not on file  Social Needs  . Financial resource strain: Not on file  . Food insecurity:    Worry: Not on file    Inability: Not on file  . Transportation needs:    Medical: Not on file    Non-medical: Not on file  Tobacco Use  . Smoking status: Never Smoker  . Smokeless tobacco: Never Used  Substance and Sexual Activity  . Alcohol use: No    Alcohol/week: 0.0 oz  . Drug use: No  . Sexual activity: Not on file  Lifestyle  . Physical activity:    Days per week: Not on file    Minutes per session: Not on file  . Stress: Not on file  Relationships  . Social connections:    Talks on phone: Not on file    Gets together: Not on file    Attends religious service: Not on file    Active member of club or organization: Not on file    Attends meetings of clubs or organizations: Not on file    Relationship status: Not on file  Other Topics Concern  . Not on file  Social History Narrative  . Not on file   Past Surgical History:  Procedure Laterality Date  . CARDIAC SURGERY  2003   stent  . IMPLANTATION VAGAL NERVE STIMULATOR    . TRIGGER FINGER RELEASE    . TUMOR REMOVAL  2002   S1 tumor removal by Dr. Annette Stable  . VAGAL NERVE STIMULATOR REMOVAL     Past Medical History:  Diagnosis Date  . Arthritis   . Diabetes mellitus   . High blood pressure   . Plantar fasciitis    There were no vitals taken for this visit.  Opioid Risk Score:   Fall Risk Score:  `1  Depression screen PHQ 2/9  Depression screen [facility] 2/9 04/30/2017 02/27/2017 01/30/2017 07/04/2016 06/01/2016 04/02/2015 11/10/2014  Decreased Interest 0 0 0 0 0 0 0  Down, Depressed, Hopeless 0 0 0 1 0 0 0  PHQ - 2 Score 0 0 0 1 0 0 0  Altered sleeping - - - - - - -  Tired, decreased energy - - - - - - -  Change in appetite - - - - - - -  Feeling bad or failure about yourself  - - - - - - -  Trouble concentrating - - - - - - -  Moving slowly or fidgety/restless - - - - -  - -  Suicidal thoughts - - - - - - -  PHQ-9 Score - - - - - - -     Review of Systems  Constitutional: Positive for unexpected weight change.  HENT: Negative.   Eyes: Negative.   Respiratory: Positive for apnea.   Cardiovascular: Negative.   Gastrointestinal: Negative.   Endocrine: Negative.   Genitourinary: Negative.   Musculoskeletal: Positive for arthralgias and myalgias.  Skin: Negative.   Allergic/Immunologic: Negative.   Neurological: Positive for numbness.  Hematological: Negative.   Psychiatric/Behavioral: Positive for dysphoric mood.  All other systems reviewed and are negative.      Objective:   Physical Exam  Constitutional: He is oriented to person, place, and time. He appears well-developed and well-nourished.  HENT:  Head: Normocephalic and atraumatic.  Neck: Normal range of motion. Neck supple.  Cardiovascular: Normal rate and regular rhythm.  Pulmonary/Chest: Effort normal.  Musculoskeletal:  Normal Muscle Bulk and Muscle Testing Reveals: Upper Extremities: Full ROM and Muscle Strength 5/5 Lower Extremities: Full ROM and Muscle Strength 5/5 Arises from chair with ease Narrow Based Gait  Neurological: He is alert and oriented to person, place, and time.  Skin: Skin is warm and dry.  Psychiatric: He has a normal mood and affect.  Nursing note and vitals reviewed.         Assessment & Plan:  1. 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  We will continue the opioid monitoring program, this consists of regular clinic visits, examinations, urine drug screen, pill counts as well as use of [place] Controlled Substance Reporting System. 2. Diabetic peripheral neuropathy affecting both feet: Continue current medication regimen withGabapentin PCP Following. 07/02/2017 3. Chronic Bilateral Low Back Pain: Continue current  treatment regimen with HEP as Tolerated. Continue current medication regimen.07/02/2017  15 minutes of face to face patient care time was spent during this visit. All questions were encouraged and answered.  F/U in 1 month

## 2017-07-31 ENCOUNTER — Encounter: Payer: Self-pay | Admitting: Physical Medicine & Rehabilitation

## 2017-07-31 ENCOUNTER — Encounter: Payer: Medicare Other | Attending: Physical Medicine & Rehabilitation

## 2017-07-31 ENCOUNTER — Ambulatory Visit (HOSPITAL_BASED_OUTPATIENT_CLINIC_OR_DEPARTMENT_OTHER): Payer: Medicare Other | Admitting: Physical Medicine & Rehabilitation

## 2017-07-31 VITALS — BP 134/69 | HR 61 | Resp 14 | Ht 66.0 in | Wt 157.0 lb

## 2017-07-31 DIAGNOSIS — Z760 Encounter for issue of repeat prescription: Secondary | ICD-10-CM | POA: Diagnosis not present

## 2017-07-31 DIAGNOSIS — M792 Neuralgia and neuritis, unspecified: Secondary | ICD-10-CM | POA: Diagnosis not present

## 2017-07-31 DIAGNOSIS — M545 Low back pain, unspecified: Secondary | ICD-10-CM

## 2017-07-31 DIAGNOSIS — M79671 Pain in right foot: Secondary | ICD-10-CM | POA: Insufficient documentation

## 2017-07-31 DIAGNOSIS — M5418 Radiculopathy, sacral and sacrococcygeal region: Secondary | ICD-10-CM | POA: Diagnosis not present

## 2017-07-31 DIAGNOSIS — G8929 Other chronic pain: Secondary | ICD-10-CM | POA: Diagnosis present

## 2017-07-31 DIAGNOSIS — E1342 Other specified diabetes mellitus with diabetic polyneuropathy: Secondary | ICD-10-CM | POA: Insufficient documentation

## 2017-07-31 DIAGNOSIS — D3610 Benign neoplasm of peripheral nerves and autonomic nervous system, unspecified: Secondary | ICD-10-CM | POA: Insufficient documentation

## 2017-07-31 DIAGNOSIS — Z86018 Personal history of other benign neoplasm: Secondary | ICD-10-CM

## 2017-07-31 MED ORDER — OXYCODONE HCL 15 MG PO TABS: 15.0000 mg | ORAL_TABLET | Freq: Three times a day (TID) | ORAL | 0 refills | Status: DC | PRN

## 2017-07-31 NOTE — Progress Notes (Signed)
Subjective:    Patient ID: Troy [name], male    DOB: Aug 05, 1945, 72 y.o.   MRN: [id number]  HPI  72 year old male with history of schwannoma status post resection with history of chronic right S1 radiculitis. He has had no new problems related to his back over the last month.  He continues to see nurse practitioner on a monthly basis for medication monitoring. Poor appetite and vomiting over the last month.  Had GI w/u, EGD last week, he has been referred to [facility]  No change in back pain or R foot pain or numbness, no increased weakness.  No constipation from pain meds, no need for laxative use Pain Inventory Average Pain 8 Pain Right Now 7 My pain is aching  In the last 24 hours, has pain interfered with the following? General activity 8 Relation with others 7 Enjoyment of life 8 What TIME of day is your pain at its worst? daytime, evening Sleep (in general) Good  Pain is worse with: bending and inactivity Pain improves with: rest and medication Relief from Meds: 8  Mobility walk without assistance how many minutes can you walk? 20 ability to climb steps?  yes do you drive?  yes transfers alone Do you have any goals in this area?  yes  Function retired Do you have any goals in this area?  yes  Neuro/Psych numbness depression  Prior Studies Any changes since last visit?  no  Physicians involved in your care Any changes since last visit?  no   Family History  Problem Relation Age of Onset  . Hypertension Mother   . Diabetes Mother   . Stroke Mother        multiple  . Hypertension Father   . Stroke Father   . Stroke Sister   . Stroke Daughter    Social History   Socioeconomic History  . Marital status: Married    Spouse name: Not on file  . Number of children: Not on file  . Years of education: Not on file  . Highest education level: Not on file  Occupational History  . Not on file  Social Needs  . Financial resource strain: Not on file  .  Food insecurity:    Worry: Not on file    Inability: Not on file  . Transportation needs:    Medical: Not on file    Non-medical: Not on file  Tobacco Use  . Smoking status: Never Smoker  . Smokeless tobacco: Never Used  Substance and Sexual Activity  . Alcohol use: No    Alcohol/week: 0.0 oz  . Drug use: No  . Sexual activity: Not on file  Lifestyle  . Physical activity:    Days per week: Not on file    Minutes per session: Not on file  . Stress: Not on file  Relationships  . Social connections:    Talks on phone: Not on file    Gets together: Not on file    Attends religious service: Not on file    Active member of club or organization: Not on file    Attends meetings of clubs or organizations: Not on file    Relationship status: Not on file  Other Topics Concern  . Not on file  Social History Narrative  . Not on file   Past Surgical History:  Procedure Laterality Date  . CARDIAC SURGERY  2003   stent  . IMPLANTATION VAGAL NERVE STIMULATOR    . TRIGGER FINGER RELEASE    .  TUMOR REMOVAL  2002   S1 tumor removal by Dr. Annette Stable  . VAGAL NERVE STIMULATOR REMOVAL     Past Medical History:  Diagnosis Date  . Arthritis   . Diabetes mellitus   . High blood pressure   . Plantar fasciitis    BP 134/69 (BP Location: Left Arm, Patient Position: Sitting, Cuff Size: Normal)   Pulse 61   Resp 14   Ht 5\' 6"  (1.676 m)   Wt 157 lb (71.2 kg)   SpO2 97%   BMI 25.34 kg/m   Opioid Risk Score:   Fall Risk Score:  `1  Depression screen PHQ 2/9  Depression screen [facility] 2/9 04/30/2017 02/27/2017 01/30/2017 07/04/2016 06/01/2016 04/02/2015 11/10/2014  Decreased Interest 0 0 0 0 0 0 0  Down, Depressed, Hopeless 0 0 0 1 0 0 0  PHQ - 2 Score 0 0 0 1 0 0 0  Altered sleeping - - - - - - -  Tired, decreased energy - - - - - - -  Change in appetite - - - - - - -  Feeling bad or failure about yourself  - - - - - - -  Trouble concentrating - - - - - - -  Moving slowly or fidgety/restless -  - - - - - -  Suicidal thoughts - - - - - - -  PHQ-9 Score - - - - - - -    Review of Systems  Constitutional: Positive for unexpected weight change.  HENT: Negative.   Eyes: Negative.   Respiratory: Positive for apnea.   Cardiovascular: Negative.   Gastrointestinal: Negative.   Endocrine:       High blood sugar   Genitourinary: Negative.   Musculoskeletal: Positive for arthralgias and back pain.  Allergic/Immunologic: Negative.   Neurological: Positive for numbness.  Hematological: Negative.   Psychiatric/Behavioral: Positive for dysphoric mood.       Objective:   Physical Exam  Constitutional: He is oriented to person, place, and time. He appears well-developed and well-nourished.  HENT:  Head: Normocephalic and atraumatic.  HOH  Eyes: Pupils are equal, round, and reactive to light. EOM are normal.  Neurological: He is alert and oriented to person, place, and time. He has normal strength. No sensory deficit. He displays a negative Romberg sign. Gait normal.  Reflex Scores:      Patellar reflexes are 3+ on the right side and 3+ on the left side.      Achilles reflexes are 0 on the right side and 0 on the left side. Motor 5/5 in B HF, KE, ADF    Psychiatric: He has a normal mood and affect.          Assessment & Plan:  1.  Hx of Schwannoma affecting S1 nerve root with chronic Right foot pain  Oxycodone 30m TID, PMP aware website reviewed, updated controlled substance agreement, last urine drug screen performed 04/30/2017 was appropriate            nortriptyline 10mg  qhs discontinued, now on Remeron per PCP Gabapentin 800mg  QID- PCP Rx Nurse practitioner visit in 1 month 2.  Diabetes well controlled HgA1 c reported as 6.5 per pt

## 2017-08-27 ENCOUNTER — Other Ambulatory Visit: Payer: Self-pay

## 2017-08-27 ENCOUNTER — Encounter: Payer: Medicare Other | Attending: Physical Medicine & Rehabilitation | Admitting: Registered Nurse

## 2017-08-27 ENCOUNTER — Encounter: Payer: Self-pay | Admitting: Registered Nurse

## 2017-08-27 VITALS — BP 136/64 | HR 65 | Ht 67.0 in | Wt 161.8 lb

## 2017-08-27 DIAGNOSIS — E1140 Type 2 diabetes mellitus with diabetic neuropathy, unspecified: Secondary | ICD-10-CM

## 2017-08-27 DIAGNOSIS — Z5181 Encounter for therapeutic drug level monitoring: Secondary | ICD-10-CM

## 2017-08-27 DIAGNOSIS — Z79891 Long term (current) use of opiate analgesic: Secondary | ICD-10-CM

## 2017-08-27 DIAGNOSIS — Z86018 Personal history of other benign neoplasm: Secondary | ICD-10-CM

## 2017-08-27 DIAGNOSIS — M545 Low back pain, unspecified: Secondary | ICD-10-CM

## 2017-08-27 DIAGNOSIS — Z760 Encounter for issue of repeat prescription: Secondary | ICD-10-CM | POA: Insufficient documentation

## 2017-08-27 DIAGNOSIS — M79671 Pain in right foot: Secondary | ICD-10-CM | POA: Diagnosis present

## 2017-08-27 DIAGNOSIS — G894 Chronic pain syndrome: Secondary | ICD-10-CM | POA: Diagnosis not present

## 2017-08-27 DIAGNOSIS — G8929 Other chronic pain: Secondary | ICD-10-CM | POA: Diagnosis not present

## 2017-08-27 DIAGNOSIS — D3610 Benign neoplasm of peripheral nerves and autonomic nervous system, unspecified: Secondary | ICD-10-CM | POA: Diagnosis not present

## 2017-08-27 DIAGNOSIS — E1149 Type 2 diabetes mellitus with other diabetic neurological complication: Secondary | ICD-10-CM

## 2017-08-27 DIAGNOSIS — E1342 Other specified diabetes mellitus with diabetic polyneuropathy: Secondary | ICD-10-CM | POA: Diagnosis not present

## 2017-08-27 DIAGNOSIS — M5418 Radiculopathy, sacral and sacrococcygeal region: Secondary | ICD-10-CM | POA: Diagnosis not present

## 2017-08-27 MED ORDER — OXYCODONE HCL 15 MG PO TABS: 15.0000 mg | ORAL_TABLET | Freq: Three times a day (TID) | ORAL | 0 refills | Status: DC | PRN

## 2017-08-27 NOTE — Progress Notes (Signed)
Subjective:    Patient ID: Troy [name], male    DOB: 04/11/1945, 72 y.o.   MRN: [id number]  HPI: Troy [name] is a 72 Year old male who returns for follow up appointment for chronic pain and medication refill. He states his pain is located in his lower back with activity and right foot pain. He rates his pain 7. His current exercise regime is walking.   Troy [name] Morphine Equivalent is 67.50 MME. Last UDS was Performed on 04/30/2017, it was consistent.   Pain Inventory Average Pain 8 Pain Right Now 7 My pain is aching  In the last 24 hours, has pain interfered with the following? General activity 7 Relation with others 7 Enjoyment of life 8 What TIME of day is your pain at its worst? daytime Sleep (in general) Fair  Pain is worse with: bending and some activites Pain improves with: rest and medication Relief from Meds: 8  Mobility ability to climb steps?  yes do you drive?  yes Do you have any goals in this area?  yes  Function retired Do you have any goals in this area?  yes  Neuro/Psych numbness depression  Prior Studies Any changes since last visit?  no  Physicians involved in your care Any changes since last visit?  no   Family History  Problem Relation Age of Onset  . Hypertension Mother   . Diabetes Mother   . Stroke Mother        multiple  . Hypertension Father   . Stroke Father   . Stroke Sister   . Stroke Daughter    Social History   Socioeconomic History  . Marital status: Married    Spouse name: Not on file  . Number of children: Not on file  . Years of education: Not on file  . Highest education level: Not on file  Occupational History  . Not on file  Social Needs  . Financial resource strain: Not on file  . Food insecurity:    Worry: Not on file    Inability: Not on file  . Transportation needs:    Medical: Not on file    Non-medical: Not on file  Tobacco Use  . Smoking status: Never Smoker  . Smokeless tobacco: Never  Used  Substance and Sexual Activity  . Alcohol use: No    Alcohol/week: 0.0 oz  . Drug use: No  . Sexual activity: Not on file  Lifestyle  . Physical activity:    Days per week: Not on file    Minutes per session: Not on file  . Stress: Not on file  Relationships  . Social connections:    Talks on phone: Not on file    Gets together: Not on file    Attends religious service: Not on file    Active member of club or organization: Not on file    Attends meetings of clubs or organizations: Not on file    Relationship status: Not on file  Other Topics Concern  . Not on file  Social History Narrative  . Not on file   Past Surgical History:  Procedure Laterality Date  . CARDIAC SURGERY  2003   stent  . IMPLANTATION VAGAL NERVE STIMULATOR    . TRIGGER FINGER RELEASE    . TUMOR REMOVAL  2002   S1 tumor removal by Dr. Annette Stable  . VAGAL NERVE STIMULATOR REMOVAL     Past Medical History:  Diagnosis Date  . Arthritis   .  Diabetes mellitus   . High blood pressure   . Plantar fasciitis    BP 136/64   Pulse 65   Ht 5\' 7"  (1.702 m)   Wt 161 lb 12.8 oz (73.4 kg)   SpO2 98%   BMI 25.34 kg/m   Opioid Risk Score:   Fall Risk Score:  `1  Depression screen PHQ 2/9  Depression screen [facility] 2/9 08/27/2017 04/30/2017 02/27/2017 01/30/2017 07/04/2016 06/01/2016 04/02/2015  Decreased Interest 0 0 0 0 0 0 0  Down, Depressed, Hopeless 0 0 0 0 1 0 0  PHQ - 2 Score 0 0 0 0 1 0 0  Altered sleeping - - - - - - -  Tired, decreased energy - - - - - - -  Change in appetite - - - - - - -  Feeling bad or failure about yourself  - - - - - - -  Trouble concentrating - - - - - - -  Moving slowly or fidgety/restless - - - - - - -  Suicidal thoughts - - - - - - -  PHQ-9 Score - - - - - - -    Review of Systems  Constitutional: Negative.   HENT: Negative.   Eyes: Negative.   Respiratory: Positive for apnea.   Cardiovascular: Negative.   Gastrointestinal: Negative.   Endocrine: Negative.     Genitourinary: Negative.   Musculoskeletal: Negative.   Skin: Negative.   Allergic/Immunologic: Negative.   Neurological: Negative.   Hematological: Negative.   Psychiatric/Behavioral: Negative.   All other systems reviewed and are negative.      Objective:   Physical Exam  Constitutional: He is oriented to person, place, and time. He appears well-developed and well-nourished.  HENT:  Head: Normocephalic and atraumatic.  Neck: Normal range of motion. Neck supple.  Cardiovascular: Normal rate and regular rhythm.  Pulmonary/Chest: Effort normal and breath sounds normal.  Musculoskeletal:  Normal Muscle Bulk and Muscle Testing Reveals: Upper Extremities: Full ROM and Muscle Strength 5/5 Back without spinal tenderness noted Lower Extremities: Full ROM and Muscle Strength 5/5 Right Lower Extremities Flexion Produces Pain Into Right Foot Arises from chair with ease Narrow Based Gait  Neurological: He is alert and oriented to person, place, and time.  Skin: Skin is warm and dry.  Psychiatric: He has a normal mood and affect. His behavior is normal.  Nursing note and vitals reviewed.         Assessment & Plan:  1. 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  We will continue the opioid monitoring program, this consists of regular clinic visits, examinations, urine drug screen, pill counts as well as use of [place] Controlled Substance Reporting System. 2. Diabetic peripheral neuropathy affecting both feet: Continue current medication regimen withGabapentin PCP Following. 08/27/2017 3. Chronic Bilateral Low Back Pain: Continuecurrent treatment regimenwith HEP as Tolerated. Continue current medication regimen.08/27/2017  15 minutes of face to face patient care time was spent during this visit. All questions were encouraged and answered.  F/U  in 1 month

## 2017-08-28 ENCOUNTER — Ambulatory Visit: Payer: Medicare Other | Admitting: Registered Nurse

## 2017-09-27 ENCOUNTER — Encounter: Payer: Medicare Other | Attending: Physical Medicine & Rehabilitation | Admitting: Registered Nurse

## 2017-09-27 ENCOUNTER — Encounter: Payer: Self-pay | Admitting: Registered Nurse

## 2017-09-27 VITALS — BP 114/64 | HR 60 | Resp 14 | Ht 67.0 in | Wt 165.0 lb

## 2017-09-27 DIAGNOSIS — E1140 Type 2 diabetes mellitus with diabetic neuropathy, unspecified: Secondary | ICD-10-CM | POA: Diagnosis not present

## 2017-09-27 DIAGNOSIS — G8929 Other chronic pain: Secondary | ICD-10-CM | POA: Insufficient documentation

## 2017-09-27 DIAGNOSIS — Z5181 Encounter for therapeutic drug level monitoring: Secondary | ICD-10-CM | POA: Diagnosis not present

## 2017-09-27 DIAGNOSIS — M79671 Pain in right foot: Secondary | ICD-10-CM | POA: Insufficient documentation

## 2017-09-27 DIAGNOSIS — E1342 Other specified diabetes mellitus with diabetic polyneuropathy: Secondary | ICD-10-CM | POA: Diagnosis not present

## 2017-09-27 DIAGNOSIS — E1149 Type 2 diabetes mellitus with other diabetic neurological complication: Secondary | ICD-10-CM | POA: Diagnosis not present

## 2017-09-27 DIAGNOSIS — Z760 Encounter for issue of repeat prescription: Secondary | ICD-10-CM | POA: Diagnosis not present

## 2017-09-27 DIAGNOSIS — Z79891 Long term (current) use of opiate analgesic: Secondary | ICD-10-CM

## 2017-09-27 DIAGNOSIS — M5418 Radiculopathy, sacral and sacrococcygeal region: Secondary | ICD-10-CM | POA: Insufficient documentation

## 2017-09-27 DIAGNOSIS — Z86018 Personal history of other benign neoplasm: Secondary | ICD-10-CM

## 2017-09-27 DIAGNOSIS — G894 Chronic pain syndrome: Secondary | ICD-10-CM

## 2017-09-27 DIAGNOSIS — D3610 Benign neoplasm of peripheral nerves and autonomic nervous system, unspecified: Secondary | ICD-10-CM | POA: Insufficient documentation

## 2017-09-27 MED ORDER — OXYCODONE HCL 15 MG PO TABS: 15.0000 mg | ORAL_TABLET | Freq: Three times a day (TID) | ORAL | 0 refills | Status: DC | PRN

## 2017-09-27 NOTE — Progress Notes (Signed)
Subjective:    Patient ID: Troy [name], male    DOB: Jul 15, 1945, 72 y.o.   MRN: [id number]  HPI: Troy [name] is a 72 year old male who returns for follow up appointment for chronic pain and medication refill. He states his pain is located in his right foot. He rates his pain 8. His current exercise regime is walking and outside chores.   Troy [name] is 67.50 MME. Last UDS was Performed on 04/30/2017, it was consistent.    Pain Inventory Average Pain 8 Pain Right Now 8 My pain is intermittent and aching  In the last 24 hours, has pain interfered with the following? General activity 7 Relation with others 8 Enjoyment of life 9 What TIME of day is your pain at its worst? daytime, evening  Sleep (in general) Good  Pain is worse with: bending and some activites Pain improves with: rest and medication Relief from Meds: 8  Mobility walk without assistance how many minutes can you walk? 20 ability to climb steps?  yes do you drive?  yes Do you have any goals in this area?  yes  Function retired  Neuro/Psych depression  Prior Studies Any changes since last visit?  no  Physicians involved in your care Any changes since last visit?  no   Family History  Problem Relation Age of Onset  . Hypertension Mother   . Diabetes Mother   . Stroke Mother        multiple  . Hypertension Father   . Stroke Father   . Stroke Sister   . Stroke Daughter    Social History   Socioeconomic History  . Marital status: Married    Spouse name: Not on file  . Number of children: Not on file  . Years of education: Not on file  . Highest education level: Not on file  Occupational History  . Not on file  Social Needs  . Financial resource strain: Not on file  . Food insecurity:    Worry: Not on file    Inability: Not on file  . Transportation needs:    Medical: Not on file    Non-medical: Not on file  Tobacco Use  . Smoking status: Never Smoker  .  Smokeless tobacco: Never Used  Substance and Sexual Activity  . Alcohol use: No    Alcohol/week: 0.0 standard drinks  . Drug use: No  . Sexual activity: Not on file  Lifestyle  . Physical activity:    Days per week: Not on file    Minutes per session: Not on file  . Stress: Not on file  Relationships  . Social connections:    Talks on phone: Not on file    Gets together: Not on file    Attends religious service: Not on file    Active member of club or organization: Not on file    Attends meetings of clubs or organizations: Not on file    Relationship status: Not on file  Other Topics Concern  . Not on file  Social History Narrative  . Not on file   Past Surgical History:  Procedure Laterality Date  . CARDIAC SURGERY  2003   stent  . IMPLANTATION VAGAL NERVE STIMULATOR    . TRIGGER FINGER RELEASE    . TUMOR REMOVAL  2002   S1 tumor removal by Dr. Annette Stable  . VAGAL NERVE STIMULATOR REMOVAL     Past Medical History:  Diagnosis Date  .  Arthritis   . Diabetes mellitus   . High blood pressure   . Plantar fasciitis    BP 114/64 (BP Location: Left Arm, Patient Position: Sitting, Cuff Size: Normal)   Pulse 60   Resp 14   Ht 5\' 7"  (1.702 m)   Wt 165 lb (74.8 kg)   SpO2 96%   BMI 25.84 kg/m   Opioid Risk Score:   Fall Risk Score:  `1  Depression screen PHQ 2/9  Depression screen [facility] 2/9 08/27/2017 04/30/2017 02/27/2017 01/30/2017 07/04/2016 06/01/2016 04/02/2015  Decreased Interest 0 0 0 0 0 0 0  Down, Depressed, Hopeless 0 0 0 0 1 0 0  PHQ - 2 Score 0 0 0 0 1 0 0  Altered sleeping - - - - - - -  Tired, decreased energy - - - - - - -  Change in appetite - - - - - - -  Feeling bad or failure about yourself  - - - - - - -  Trouble concentrating - - - - - - -  Moving slowly or fidgety/restless - - - - - - -  Suicidal thoughts - - - - - - -  PHQ-9 Score - - - - - - -    Review of Systems  Constitutional: Negative.   HENT: Negative.   Eyes: Negative.   Respiratory:  Negative.   Cardiovascular: Negative.   Gastrointestinal: Negative.   Endocrine: Negative.   Genitourinary: Negative.   Musculoskeletal: Negative.   Skin: Negative.   Allergic/Immunologic: Negative.   Neurological: Negative.   Hematological: Negative.   Psychiatric/Behavioral: Positive for dysphoric mood.  All other systems reviewed and are negative.      Objective:   Physical Exam  Constitutional: He is oriented to person, place, and time. He appears well-developed and well-nourished.  HENT:  Head: Normocephalic and atraumatic.  Neck: Normal range of motion. Neck supple.  Cardiovascular: Normal rate and regular rhythm.  Pulmonary/Chest: Effort normal and breath sounds normal.  Musculoskeletal:  Normal Muscle Bulk and Muscle Testing Reveals: Upper Extremities: Full ROM and Muscle Strength 5/5 Lower Extremities: Full ROM and Muscle Strength 5/5 Arises from chair with ease Narrow Based Gait   Neurological: He is alert and oriented to person, place, and time.  Skin: Skin is warm and dry.  Psychiatric: He has a normal mood and affect. His behavior is normal.  Nursing note and vitals reviewed.         Assessment & Plan:  1. 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  We will continue the opioid monitoring program, this consists of regular clinic visits, examinations, urine drug screen, pill counts as well as use of [place] Controlled Substance Reporting System. 2. Diabetic peripheral neuropathy affecting both feet: Continue current medication regimen withGabapentin PCP Following. 08/082019 3. Chronic Bilateral Low Back Pain: No complaints today. Continuecurrent treatment regimenwith HEP as Tolerated. Continue current medication regimen.09/27/2017  15 minutes of face to face patient care time was spent during this visit. All questions were  encouraged and answered.  F/U in 1 month

## 2017-10-30 ENCOUNTER — Encounter: Payer: Medicare Other | Attending: Physical Medicine & Rehabilitation | Admitting: Registered Nurse

## 2017-10-30 ENCOUNTER — Encounter: Payer: Self-pay | Admitting: Registered Nurse

## 2017-10-30 VITALS — BP 134/69 | HR 60 | Ht 67.0 in | Wt 163.4 lb

## 2017-10-30 DIAGNOSIS — E1140 Type 2 diabetes mellitus with diabetic neuropathy, unspecified: Secondary | ICD-10-CM

## 2017-10-30 DIAGNOSIS — Z86018 Personal history of other benign neoplasm: Secondary | ICD-10-CM | POA: Diagnosis not present

## 2017-10-30 DIAGNOSIS — Z760 Encounter for issue of repeat prescription: Secondary | ICD-10-CM | POA: Diagnosis not present

## 2017-10-30 DIAGNOSIS — G894 Chronic pain syndrome: Secondary | ICD-10-CM

## 2017-10-30 DIAGNOSIS — Z5181 Encounter for therapeutic drug level monitoring: Secondary | ICD-10-CM

## 2017-10-30 DIAGNOSIS — Z79891 Long term (current) use of opiate analgesic: Secondary | ICD-10-CM

## 2017-10-30 DIAGNOSIS — D3610 Benign neoplasm of peripheral nerves and autonomic nervous system, unspecified: Secondary | ICD-10-CM | POA: Diagnosis not present

## 2017-10-30 DIAGNOSIS — G8929 Other chronic pain: Secondary | ICD-10-CM | POA: Diagnosis not present

## 2017-10-30 DIAGNOSIS — M5418 Radiculopathy, sacral and sacrococcygeal region: Secondary | ICD-10-CM | POA: Diagnosis not present

## 2017-10-30 DIAGNOSIS — M79671 Pain in right foot: Secondary | ICD-10-CM | POA: Diagnosis present

## 2017-10-30 DIAGNOSIS — E1342 Other specified diabetes mellitus with diabetic polyneuropathy: Secondary | ICD-10-CM | POA: Diagnosis not present

## 2017-10-30 DIAGNOSIS — E1149 Type 2 diabetes mellitus with other diabetic neurological complication: Secondary | ICD-10-CM

## 2017-10-30 MED ORDER — OXYCODONE HCL 15 MG PO TABS: 15.0000 mg | ORAL_TABLET | Freq: Three times a day (TID) | ORAL | 0 refills | Status: DC | PRN

## 2017-10-30 NOTE — Progress Notes (Signed)
Subjective:    Patient ID: Troy [name], male    DOB: [birth year], 72 y.o.   MRN: [id number]  HPI: [name] is a 72 year old male who returns for follow up appointment for chronic pain and medication refill. He states his pain is located in his right foot. He rates his pain 7. His current exercise regime is walking and performing outside chores.   Mr. Byrom Morphine Equivalent is 67.50 MME. Last UDS was Performed on 04/30/2017, it was consistent.   Pain Inventory Average Pain 8 Pain Right Now 7 My pain is aching  In the last 24 hours, has pain interfered with the following? General activity 8 Relation with others 8 Enjoyment of life 8 What TIME of day is your pain at its worst? morning Sleep (in general) Good  Pain is worse with: bending and some activites Pain improves with: rest and medication Relief from Meds: 8  Mobility walk without assistance ability to climb steps?  yes do you drive?  yes  Function retired  Neuro/Psych numbness depression  Prior Studies Any changes since last visit?  no  Physicians involved in your care Any changes since last visit?  no   Family History  Problem Relation Age of Onset  . Hypertension Mother   . Diabetes Mother   . Stroke Mother        multiple  . Hypertension Father   . Stroke Father   . Stroke Sister   . Stroke Daughter    Social History   Socioeconomic History  . Marital status: Married    Spouse name: Not on file  . Number of children: Not on file  . Years of education: Not on file  . Highest education level: Not on file  Occupational History  . Not on file  Social Needs  . Financial resource strain: Not on file  . Food insecurity:    Worry: Not on file    Inability: Not on file  . Transportation needs:    Medical: Not on file    Non-medical: Not on file  Tobacco Use  . Smoking status: Never Smoker  . Smokeless tobacco: Never Used  Substance and Sexual Activity  . Alcohol use: No   Alcohol/week: 0.0 standard drinks  . Drug use: No  . Sexual activity: Not on file  Lifestyle  . Physical activity:    Days per week: Not on file    Minutes per session: Not on file  . Stress: Not on file  Relationships  . Social connections:    Talks on phone: Not on file    Gets together: Not on file    Attends religious service: Not on file    Active member of club or organization: Not on file    Attends meetings of clubs or organizations: Not on file    Relationship status: Not on file  Other Topics Concern  . Not on file  Social History Narrative  . Not on file   Past Surgical History:  Procedure Laterality Date  . CARDIAC SURGERY  2003   stent  . IMPLANTATION VAGAL NERVE STIMULATOR    . TRIGGER FINGER RELEASE    . TUMOR REMOVAL  2002   S1 tumor removal by Dr. Annette Stable  . VAGAL NERVE STIMULATOR REMOVAL     Past Medical History:  Diagnosis Date  . Arthritis   . Diabetes mellitus   . High blood pressure   . Plantar fasciitis    There  were no vitals taken for this visit.  Opioid Risk Score:   Fall Risk Score:  `1  Depression screen PHQ 2/9  Depression screen [facility] 2/9 08/27/2017 04/30/2017 02/27/2017 01/30/2017 07/04/2016 06/01/2016 04/02/2015  Decreased Interest 0 0 0 0 0 0 0  Down, Depressed, Hopeless 0 0 0 0 1 0 0  PHQ - 2 Score 0 0 0 0 1 0 0  Altered sleeping - - - - - - -  Tired, decreased energy - - - - - - -  Change in appetite - - - - - - -  Feeling bad or failure about yourself  - - - - - - -  Trouble concentrating - - - - - - -  Moving slowly or fidgety/restless - - - - - - -  Suicidal thoughts - - - - - - -  PHQ-9 Score - - - - - - -     Review of Systems  Constitutional: Negative.   HENT: Negative.   Eyes: Negative.   Respiratory: Negative.   Cardiovascular: Negative.   Gastrointestinal: Negative.   Endocrine: Negative.   Genitourinary:       High/low sugar  Musculoskeletal: Positive for arthralgias and myalgias.  Skin: Negative.     Allergic/Immunologic: Negative.   Neurological: Positive for numbness.  Hematological: Negative.   Psychiatric/Behavioral: Positive for dysphoric mood.  All other systems reviewed and are negative.      Objective:   Physical Exam  Constitutional: He is oriented to person, place, and time. He appears well-developed and well-nourished.  HENT:  Head: Normocephalic and atraumatic.  Neck: Normal range of motion. Neck supple.  Cardiovascular: Normal rate and regular rhythm.  Pulmonary/Chest: Effort normal and breath sounds normal.  Musculoskeletal:  Normal Muscle Bulk and Muscle Testing Reveals: Upper Extremities: Full ROM and Muscle Strength 5/5 Lower Extremities: Full ROM and Muscle Strength 5/5 Arises from chair with ease Narrow Based Gait  Neurological: He is alert and oriented to person, place, and time.  Skin: Skin is warm and dry.  Psychiatric: He has a normal mood and affect. His behavior is normal.  Nursing note and vitals reviewed.         Assessment & Plan:  1. 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  We will continue the opioid monitoring program, this consists of regular clinic visits, examinations, urine drug screen, pill counts as well as use of [place] Controlled Substance Reporting System. 2. Diabetic peripheral neuropathy affecting both feet: Continue current medication regimen withGabapentin PCP Following. 09/092019 3. Chronic Bilateral Low Back Pain: No complaints today. Continuecurrent treatment regimenwith HEP as Tolerated. Continue current medication regimen.10/29/2017  15 minutes of face to face patient care time was spent during this visit. All questions were encouraged and answered.  F/U in 1 month

## 2017-11-05 ENCOUNTER — Telehealth: Payer: Self-pay | Admitting: *Deleted

## 2017-11-05 LAB — TOXASSURE SELECT,+ANTIDEPR,UR

## 2017-11-05 NOTE — Telephone Encounter (Signed)
Urine drug screen for this encounter is consistent for prescribed medication 

## 2017-11-28 ENCOUNTER — Encounter: Payer: Medicare Other | Attending: Physical Medicine & Rehabilitation | Admitting: Registered Nurse

## 2017-11-28 ENCOUNTER — Other Ambulatory Visit: Payer: Self-pay

## 2017-11-28 ENCOUNTER — Encounter: Payer: Self-pay | Admitting: Registered Nurse

## 2017-11-28 VITALS — BP 121/55 | HR 63 | Ht 67.0 in | Wt 164.8 lb

## 2017-11-28 DIAGNOSIS — G8929 Other chronic pain: Secondary | ICD-10-CM | POA: Insufficient documentation

## 2017-11-28 DIAGNOSIS — Z86018 Personal history of other benign neoplasm: Secondary | ICD-10-CM | POA: Diagnosis not present

## 2017-11-28 DIAGNOSIS — G894 Chronic pain syndrome: Secondary | ICD-10-CM | POA: Diagnosis not present

## 2017-11-28 DIAGNOSIS — E1140 Type 2 diabetes mellitus with diabetic neuropathy, unspecified: Secondary | ICD-10-CM | POA: Diagnosis not present

## 2017-11-28 DIAGNOSIS — E1149 Type 2 diabetes mellitus with other diabetic neurological complication: Secondary | ICD-10-CM

## 2017-11-28 DIAGNOSIS — M5418 Radiculopathy, sacral and sacrococcygeal region: Secondary | ICD-10-CM | POA: Diagnosis not present

## 2017-11-28 DIAGNOSIS — Z5181 Encounter for therapeutic drug level monitoring: Secondary | ICD-10-CM | POA: Diagnosis not present

## 2017-11-28 DIAGNOSIS — E1342 Other specified diabetes mellitus with diabetic polyneuropathy: Secondary | ICD-10-CM | POA: Diagnosis not present

## 2017-11-28 DIAGNOSIS — Z79891 Long term (current) use of opiate analgesic: Secondary | ICD-10-CM

## 2017-11-28 DIAGNOSIS — M79671 Pain in right foot: Secondary | ICD-10-CM | POA: Diagnosis present

## 2017-11-28 DIAGNOSIS — Z760 Encounter for issue of repeat prescription: Secondary | ICD-10-CM | POA: Diagnosis not present

## 2017-11-28 DIAGNOSIS — D3610 Benign neoplasm of peripheral nerves and autonomic nervous system, unspecified: Secondary | ICD-10-CM | POA: Diagnosis not present

## 2017-11-28 MED ORDER — OXYCODONE HCL 15 MG PO TABS: 15.0000 mg | ORAL_TABLET | Freq: Three times a day (TID) | ORAL | 0 refills | Status: DC | PRN

## 2017-11-28 NOTE — Progress Notes (Signed)
Subjective:    Patient ID: Troy [name], male    DOB: 1945/11/20, 72 y.o.   MRN: [id number]  HPI: [name] is a 72 year male who returns for follow up appointment for chronic pain and medication refill. He states his pain is located in his right foot with numbness. Occasionally left foot. He rates his pain 7. His current exercise regime is walking and performing outside chores.   Mr. Polinski Morphine Equivalent is 67.50 MME. Last UDS was Performed on 10/30/2017, it was consistent.    Pain Inventory Average Pain 8 Pain Right Now 7 My pain is constant and aching  In the last 24 hours, has pain interfered with the following? General activity 8 Relation with others 8 Enjoyment of life 7 What TIME of day is your pain at its worst? daytime evening Sleep (in general) Fair  Pain is worse with: bending and standing Pain improves with: rest and medication Relief from Meds: 8  Mobility how many minutes can you walk? 15 ability to climb steps?  yes do you drive?  yes Do you have any goals in this area?  yes  Function retired Do you have any goals in this area?  yes  Neuro/Psych numbness depression  Prior Studies Any changes since last visit?  no  Physicians involved in your care Any changes since last visit?  no   Family History  Problem Relation Age of Onset  . Hypertension Mother   . Diabetes Mother   . Stroke Mother        multiple  . Hypertension Father   . Stroke Father   . Stroke Sister   . Stroke Daughter    Social History   Socioeconomic History  . Marital status: Married    Spouse name: Not on file  . Number of children: Not on file  . Years of education: Not on file  . Highest education level: Not on file  Occupational History  . Not on file  Social Needs  . Financial resource strain: Not on file  . Food insecurity:    Worry: Not on file    Inability: Not on file  . Transportation needs:    Medical: Not on file    Non-medical: Not on  file  Tobacco Use  . Smoking status: Never Smoker  . Smokeless tobacco: Never Used  Substance and Sexual Activity  . Alcohol use: No    Alcohol/week: 0.0 standard drinks  . Drug use: No  . Sexual activity: Not on file  Lifestyle  . Physical activity:    Days per week: Not on file    Minutes per session: Not on file  . Stress: Not on file  Relationships  . Social connections:    Talks on phone: Not on file    Gets together: Not on file    Attends religious service: Not on file    Active member of club or organization: Not on file    Attends meetings of clubs or organizations: Not on file    Relationship status: Not on file  Other Topics Concern  . Not on file  Social History Narrative  . Not on file   Past Surgical History:  Procedure Laterality Date  . CARDIAC SURGERY  2003   stent  . IMPLANTATION VAGAL NERVE STIMULATOR    . TRIGGER FINGER RELEASE    . TUMOR REMOVAL  2002   S1 tumor removal by Dr. Annette Stable  . VAGAL NERVE STIMULATOR REMOVAL  Past Medical History:  Diagnosis Date  . Arthritis   . Diabetes mellitus   . High blood pressure   . Plantar fasciitis    BP (!) 121/55   Pulse 63   Ht 5\' 7"  (1.702 m)   Wt 164 lb 12.8 oz (74.8 kg)   SpO2 96%   BMI 25.81 kg/m   Opioid Risk Score:   Fall Risk Score:  `1  Depression screen PHQ 2/9  Depression screen [facility] 2/9 11/28/2017 08/27/2017 04/30/2017 02/27/2017 01/30/2017 07/04/2016 06/01/2016  Decreased Interest 0 0 0 0 0 0 0  Down, Depressed, Hopeless 0 0 0 0 0 1 0  PHQ - 2 Score 0 0 0 0 0 1 0  Altered sleeping - - - - - - -  Tired, decreased energy - - - - - - -  Change in appetite - - - - - - -  Feeling bad or failure about yourself  - - - - - - -  Trouble concentrating - - - - - - -  Moving slowly or fidgety/restless - - - - - - -  Suicidal thoughts - - - - - - -  PHQ-9 Score - - - - - - -   Review of Systems  Constitutional: Negative.   HENT: Negative.   Eyes: Negative.   Respiratory: Positive for apnea.     Cardiovascular: Negative.   Gastrointestinal: Negative.   Endocrine: Negative.   Genitourinary: Negative.   Musculoskeletal: Negative.   Skin: Negative.   Allergic/Immunologic: Negative.   Neurological: Negative.   Hematological: Negative.   Psychiatric/Behavioral: Negative.   All other systems reviewed and are negative.      Objective:   Physical Exam  Constitutional: He is oriented to person, place, and time. He appears well-developed and well-nourished.  HENT:  Head: Normocephalic and atraumatic.  Neck: Normal range of motion. Neck supple.  Cardiovascular: Normal rate and regular rhythm.  Pulmonary/Chest: Effort normal and breath sounds normal.  Musculoskeletal:  Normal Muscle Bulk and Muscle Testing Reveals: Upper Extremities: Full ROM and Muscle Strength 5/5 Back without spinal tenderness noted Lower Extremities: Full ROM and Muscle Strength 5/5 Arises from chair with ease Narrow Based Gait  Neurological: He is alert and oriented to person, place, and time.  Skin: Skin is warm and dry.  Psychiatric: He has a normal mood and affect. His behavior is normal.  Nursing note and vitals reviewed.         Assessment & Plan:  1. 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  We will continue the opioid monitoring program, this consists of regular clinic visits, examinations, urine drug screen, pill counts as well as use of [place] Controlled Substance Reporting System. 2. Diabetic peripheral neuropathy affecting both feet: Continue current medication regimen withGabapentin PCP Following. 10/092019 3. Chronic Bilateral Low Back Pain:No complaints today.Continuecurrent treatment regimenwith HEP as Tolerated. Continue current medication regimen.11/28/2017  15 minutes of face to face patient care time was spent during this visit. All  questions were encouraged and answered.  F/U in 1 month

## 2017-11-29 ENCOUNTER — Ambulatory Visit: Payer: Medicare Other | Admitting: Registered Nurse

## 2017-12-31 ENCOUNTER — Encounter: Payer: Medicare Other | Attending: Physical Medicine & Rehabilitation | Admitting: Registered Nurse

## 2017-12-31 ENCOUNTER — Other Ambulatory Visit: Payer: Self-pay

## 2017-12-31 ENCOUNTER — Encounter: Payer: Self-pay | Admitting: Registered Nurse

## 2017-12-31 VITALS — BP 144/49 | HR 63 | Ht 67.0 in | Wt 167.2 lb

## 2017-12-31 DIAGNOSIS — M545 Low back pain, unspecified: Secondary | ICD-10-CM

## 2017-12-31 DIAGNOSIS — M5418 Radiculopathy, sacral and sacrococcygeal region: Secondary | ICD-10-CM | POA: Insufficient documentation

## 2017-12-31 DIAGNOSIS — G894 Chronic pain syndrome: Secondary | ICD-10-CM | POA: Diagnosis not present

## 2017-12-31 DIAGNOSIS — D3610 Benign neoplasm of peripheral nerves and autonomic nervous system, unspecified: Secondary | ICD-10-CM | POA: Insufficient documentation

## 2017-12-31 DIAGNOSIS — E1342 Other specified diabetes mellitus with diabetic polyneuropathy: Secondary | ICD-10-CM | POA: Insufficient documentation

## 2017-12-31 DIAGNOSIS — M79671 Pain in right foot: Secondary | ICD-10-CM | POA: Diagnosis present

## 2017-12-31 DIAGNOSIS — E1140 Type 2 diabetes mellitus with diabetic neuropathy, unspecified: Secondary | ICD-10-CM | POA: Diagnosis not present

## 2017-12-31 DIAGNOSIS — Z86018 Personal history of other benign neoplasm: Secondary | ICD-10-CM

## 2017-12-31 DIAGNOSIS — G8929 Other chronic pain: Secondary | ICD-10-CM | POA: Insufficient documentation

## 2017-12-31 DIAGNOSIS — Z760 Encounter for issue of repeat prescription: Secondary | ICD-10-CM | POA: Insufficient documentation

## 2017-12-31 DIAGNOSIS — E1149 Type 2 diabetes mellitus with other diabetic neurological complication: Secondary | ICD-10-CM

## 2017-12-31 DIAGNOSIS — Z79891 Long term (current) use of opiate analgesic: Secondary | ICD-10-CM

## 2017-12-31 DIAGNOSIS — Z5181 Encounter for therapeutic drug level monitoring: Secondary | ICD-10-CM

## 2017-12-31 MED ORDER — OXYCODONE HCL 15 MG PO TABS: 15.0000 mg | ORAL_TABLET | Freq: Three times a day (TID) | ORAL | 0 refills | Status: DC | PRN

## 2017-12-31 NOTE — Progress Notes (Signed)
Subjective:    Patient ID: Troy [name], male    DOB: 02/08/1946, 72 y.o.   MRN: [id number]  HPI: Troy [name] is a 72 year old male who returns for follow up appointment for chronic pain and medication refill. He states his pain is located in his lower back with activity and right foot. He rates his pain 8. His current exercise regime is walking and performing outside yard work.   Troy [name] is 67.50 MME. Last UDS was Performed on 10/30/2017, it was consistent.   Pain Inventory Average Pain 8 Pain Right Now 8 My pain is aching  In the last 24 hours, has pain interfered with the following? General activity 8 Relation with others 7 Enjoyment of life 8 What TIME of day is your pain at its worst? daytime and evening Sleep (in general) Good  Pain is worse with: bending and some activites Pain improves with: rest and medication Relief from Meds: 8  Mobility how many minutes can you walk? 20 ability to climb steps?  yes do you drive?  yes  Function retired  Neuro/Psych numbness depression  Prior Studies Any changes since last visit?  no  Physicians involved in your care Any changes since last visit?  no   Family History  Problem Relation Age of Onset  . Hypertension Mother   . Diabetes Mother   . Stroke Mother        multiple  . Hypertension Father   . Stroke Father   . Stroke Sister   . Stroke Daughter    Social History   Socioeconomic History  . Marital status: Married    Spouse name: Not on file  . Number of children: Not on file  . Years of education: Not on file  . Highest education level: Not on file  Occupational History  . Not on file  Social Needs  . Financial resource strain: Not on file  . Food insecurity:    Worry: Not on file    Inability: Not on file  . Transportation needs:    Medical: Not on file    Non-medical: Not on file  Tobacco Use  . Smoking status: Never Smoker  . Smokeless tobacco: Never Used    Substance and Sexual Activity  . Alcohol use: No    Alcohol/week: 0.0 standard drinks  . Drug use: No  . Sexual activity: Not on file  Lifestyle  . Physical activity:    Days per week: Not on file    Minutes per session: Not on file  . Stress: Not on file  Relationships  . Social connections:    Talks on phone: Not on file    Gets together: Not on file    Attends religious service: Not on file    Active member of club or organization: Not on file    Attends meetings of clubs or organizations: Not on file    Relationship status: Not on file  Other Topics Concern  . Not on file  Social History Narrative  . Not on file   Past Surgical History:  Procedure Laterality Date  . CARDIAC SURGERY  2003   stent  . IMPLANTATION VAGAL NERVE STIMULATOR    . TRIGGER FINGER RELEASE    . TUMOR REMOVAL  2002   S1 tumor removal by Dr. Annette Stable  . VAGAL NERVE STIMULATOR REMOVAL     Past Medical History:  Diagnosis Date  . Arthritis   . Diabetes mellitus   .  High blood pressure   . Plantar fasciitis    BP (!) 144/49   Pulse 63   Ht 5\' 7"  (1.702 m)   Wt 167 lb 3.2 oz (75.8 kg)   SpO2 98%   BMI 26.19 kg/m   Opioid Risk Score:   Fall Risk Score:  `1  Depression screen PHQ 2/9  Depression screen [facility] 2/9 12/31/2017 11/28/2017 08/27/2017 04/30/2017 02/27/2017 01/30/2017 07/04/2016  Decreased Interest 0 0 0 0 0 0 0  Down, Depressed, Hopeless 1 0 0 0 0 0 1  PHQ - 2 Score 1 0 0 0 0 0 1  Altered sleeping - - - - - - -  Tired, decreased energy - - - - - - -  Change in appetite - - - - - - -  Feeling bad or failure about yourself  - - - - - - -  Trouble concentrating - - - - - - -  Moving slowly or fidgety/restless - - - - - - -  Suicidal thoughts - - - - - - -  PHQ-9 Score - - - - - - -     Review of Systems  Constitutional: Negative.   HENT: Negative.   Eyes: Negative.   Respiratory: Positive for apnea.   Cardiovascular: Negative.   Endocrine:       High blood sugars   Genitourinary: Negative.   Musculoskeletal: Negative.   Skin: Negative.   Allergic/Immunologic: Negative.   Neurological: Positive for numbness.  Hematological: Negative.   Psychiatric/Behavioral: Positive for dysphoric mood.  All other systems reviewed and are negative.      Objective:   Physical Exam  Constitutional: He is oriented to person, place, and time. He appears well-developed and well-nourished.  HENT:  Head: Normocephalic and atraumatic.  Neck: Normal range of motion. Neck supple.  Cardiovascular: Normal rate and regular rhythm.  Pulmonary/Chest: Effort normal and breath sounds normal.  Musculoskeletal:  Normal Muscle Bulk and Muscle Testing Reveals: Upper Extremities: Full ROM and Muscle Strength 5/5 Lower Extremities: Full ROM and Muscle Strength 5/5 Right Lower Extremity Flexion Produces Pain  ( Numbness) into foot Arises from chair with ease Narrow Based Gait  Neurological: He is alert and oriented to person, place, and time.  Skin: Skin is warm and dry.  Nursing note and vitals reviewed.         Assessment & Plan:  1. 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  We will continue the opioid monitoring program, this consists of regular clinic visits, examinations, urine drug screen, pill counts as well as use of [place] Controlled Substance Reporting System. 2. Diabetic peripheral neuropathy affecting both feet: Continue current medication regimen withGabapentin PCP Following. 11/112019 3. Chronic Bilateral Low Back Pain:Continuecurrent treatment regimenwith HEP as Tolerated. Continue current medication regimen.12/31/2017  15 minutes of face to face patient care time was spent during this visit. All questions were encouraged and answered.  F/U in 1 month

## 2018-01-29 ENCOUNTER — Encounter: Payer: Self-pay | Admitting: Physical Medicine & Rehabilitation

## 2018-01-29 ENCOUNTER — Encounter: Payer: Medicare Other | Attending: Physical Medicine & Rehabilitation

## 2018-01-29 ENCOUNTER — Other Ambulatory Visit: Payer: Self-pay

## 2018-01-29 ENCOUNTER — Ambulatory Visit (HOSPITAL_BASED_OUTPATIENT_CLINIC_OR_DEPARTMENT_OTHER): Payer: Medicare Other | Admitting: Physical Medicine & Rehabilitation

## 2018-01-29 DIAGNOSIS — Z86018 Personal history of other benign neoplasm: Secondary | ICD-10-CM

## 2018-01-29 DIAGNOSIS — M5417 Radiculopathy, lumbosacral region: Secondary | ICD-10-CM | POA: Diagnosis not present

## 2018-01-29 DIAGNOSIS — D3610 Benign neoplasm of peripheral nerves and autonomic nervous system, unspecified: Secondary | ICD-10-CM | POA: Diagnosis not present

## 2018-01-29 DIAGNOSIS — M79671 Pain in right foot: Secondary | ICD-10-CM | POA: Insufficient documentation

## 2018-01-29 DIAGNOSIS — M5418 Radiculopathy, sacral and sacrococcygeal region: Secondary | ICD-10-CM | POA: Insufficient documentation

## 2018-01-29 DIAGNOSIS — G8929 Other chronic pain: Secondary | ICD-10-CM | POA: Insufficient documentation

## 2018-01-29 DIAGNOSIS — Z760 Encounter for issue of repeat prescription: Secondary | ICD-10-CM | POA: Diagnosis not present

## 2018-01-29 DIAGNOSIS — E1342 Other specified diabetes mellitus with diabetic polyneuropathy: Secondary | ICD-10-CM | POA: Diagnosis not present

## 2018-01-29 NOTE — Patient Instructions (Signed)
Buprenorphine buccal film What is this medicine? BUPRENORPHINE (byoo pre NOR feen) is a pain reliever. It is used to treat moderate to severe pain. This medicine may be used for other purposes; ask your health care provider or pharmacist if you have questions. COMMON BRAND NAME(S): Belbuca What should I tell my health care provider before I take this medicine? They need to know if you have any of these conditions: -blockage in your bowel -brain tumor -drink more than 3 alcohol-containing drinks per day -drug abuse or addiction -head injury -kidney disease -liver disease -lung or breathing disease, like asthma -mouth sores -thyroid disease -trouble passing urine or change in the amount of urine -an unusual or allergic reaction to buprenorphine, other medicines, foods, dyes, or preservatives -pregnant or trying to get pregnant -breast-feeding How should I use this medicine? Take this medicine by mouth. Follow the directions on the prescription label. Wet the inside of your cheek with tongue or rinse mouth with water before using this medicine. Open package with dry hands just before you are ready to use. Do not cut or tear the film. Use the tip of a dry finger to put film in the mouth with the yellow side of the film facing the cheek. Hold the film in place for 5 seconds. After you place the medicine on your cheek leave the film in place until it dissolves away in about 30 minutes. Do not move or touch the film with fingers or tongue. Do not eat or drink until the film has dissolved. Do not chew or swallow the film. Take your medicine at regular intervals. Do not take your medicine more often than directed. Do not stop taking except on your doctor's advice. A special MedGuide will be given to you by the pharmacist with each prescription and refill. Be sure to read this information carefully each time. Talk to your pediatrician regarding the use of this medicine in children. Special care may be  needed. Overdosage: If you think you have taken too much of this medicine contact a poison control center or emergency room at once. NOTE: This medicine is only for you. Do not share this medicine with others. What if I miss a dose? If you miss a dose, take it as soon as you can. If it is almost time for your next dose, take only that dose. Do not take double or extra doses. What may interact with this medicine? Do not take this medication with any of the following medicines: -cisapride -certain medicines for fungal infections like ketoconazole and itraconazole -dofetilide -dronedarone -pimozide -ritonavir -thioridazine -ziprasidone This medicine may interact with the following medications: -alcohol -antihistamines for allergy, cough and cold -antiviral medicines for HIV or AIDS -atropine -certain antibiotics like clarithromycin, erythromycin, linezolid, rifampin -certain medicines for anxiety or sleep -certain medicines for bladder problems like oxybutynin, tolterodine -certain medicines for depression like amitriptyline, fluoxetine, sertraline -certain medicines for migraine headache like almotriptan, eletriptan, frovatriptan, naratriptan, rizatriptan, sumatriptan, zolmitriptan -certain medicines for nausea or vomiting like dolasetron, ondansetron, palonosetron -certain medicines for Parkinson's disease like benztropine, trihexyphenidyl -certain medicines for seizures like phenobarbital, primidone -certain medicines for stomach problems like cimetidine, dicyclomine, hyoscyamine -certain medicines for travel sickness like scopolamine -diuretics -general anesthetics like halothane, isoflurane, methoxyflurane, propofol -ipratropium -local anesthetics like lidocaine, pramoxine, tetracaine -MAOIs like Carbex, Eldepryl, Marplan, Nardil, and Parnate -medicines that relax muscles for surgery -methylene blue -other medicines that prolong the QT interval (cause an abnormal heart  rhythm) -other narcotic medicines for pain or cough -  phenothiazines like chlorpromazine, mesoridazine, prochlorperazine, thioridazine This list may not describe all possible interactions. Give your health care provider a list of all the medicines, herbs, non-prescription drugs, or dietary supplements you use. Also tell them if you smoke, drink alcohol, or use illegal drugs. Some items may interact with your medicine. What should I watch for while using this medicine? Tell your doctor or health care professional if your pain does not go away, if it gets worse, or if you have a new or a different type of pain. You may develop tolerance to the medicine. Tolerance means that you will need a higher dose of the medicine for pain relief. Tolerance is normal and is expected if you take the medicine for a long time. Do not suddenly stop taking your medicine because you may develop a severe reaction. Your body becomes used to the medicine. This does NOT mean you are addicted. Addiction is a behavior related to getting and using a drug for a non-medical reason. If you have pain, you have a medical reason to take pain medicine. Your doctor will tell you how much medicine to take. If your doctor wants you to stop the medicine, the dose will be slowly lowered over time to avoid any side effects. If you are also taking a narcotic medicine for pain or cough or another medicine that also causes drowsiness, you may have more side effects. Give your health care provider a list of all medicines you use. Your doctor will tell you how much medicine to take. Do not take more medicine than directed. Call emergency for help if you have problems breathing or unusual sleepiness. You may get drowsy or dizzy. Do not drive, use machinery, or do anything that needs mental alertness until you know how this medicine affects you. Do not stand or sit up quickly, especially if you are an older patient. This reduces the risk of dizzy or  fainting spells. Alcohol may interfere with the effect of this medicine. Avoid alcoholic drinks. The medicine will cause constipation. Try to have a bowel movement at least every 2 to 3 days. If you do not have a bowel movement for 3 days, call your doctor or health care professional. Your mouth may get dry. Chewing sugarless gum or sucking hard candy, and drinking plenty of water may help. Contact your doctor if the problem does not go away or is severe. What side effects may I notice from receiving this medicine? Side effects that you should report to your doctor or health care professional as soon as possible: -allergic reactions like skin rash, itching or hives, swelling of the face, lips, or tongue -breathing problems -confusion -signs and symptoms of a dangerous change in heartbeat or heart rhythm like chest pain; dizziness; fast or irregular heartbeat; palpitations; feeling faint or lightheaded, falls; breathing problems -signs and symptoms of liver injury like dark yellow or brown urine; general ill feeling or flu-like symptoms; light-colored stools; loss of appetite; nausea; right upper belly pain; unusually weak or tired; yellow of the eyes or skin -signs and symptoms of low blood pressure like dizziness; feeling faint or lightheaded, falls; unusually weak or tired -trouble passing urine or change in the amount of urine Side effects that usually do not require medical attention (report to your doctor or health care professional if they continue or are bothersome): -constipation -dry mouth -nausea, vomiting -tiredness This list may not describe all possible side effects. Call your doctor for medical advice about side effects. You may  report side effects to FDA at 1-800-FDA-1088. Where should I keep my medicine? Keep out of the reach of children. This medicine can be abused. Keep your medicine in a safe place to protect it from theft. Do not share this medicine with anyone. Selling or  giving away this medicine is dangerous and against the law. Follow the directions in the Ages. Store at room temperature between 15 and 30 degrees C (59 and 86 degrees F). This medicine may cause accidental overdose and death if it is taken by other adults, children, or pets. Remove any unused films from the foil packs and flush any down the toilet to reduce the chance of harm. Throw away the empty foil packaging in the trash. Do not use the medicine after the expiration date. NOTE: This sheet is a summary. It may not cover all possible information. If you have questions about this medicine, talk to your doctor, pharmacist, or health care provider.  2018 Elsevier/Gold Standard (2015-03-11 10:29:47)

## 2018-01-29 NOTE — Progress Notes (Signed)
Subjective:    Patient ID: Troy [name], male    DOB: 28-Sep-1945, 72 y.o.   MRN: [id number]  HPI 72 year old male with history of schwannoma affecting the thoracic spinal cord with chronic right lower extremity neuropathic pain.  It has been well relieved by his current pain medication regimen.  His primary care prescribes gabapentin 800 mg 4 times daily and this clinic prescribes oxycodone 15 mg 3 times daily.  He has had no side effects from medications.  He does live in [place] and travels on a monthly basis. Last MD visit in June 2019, negative interval medical history.  Continues to see nurse practitioner on a monthly basis  Pain Inventory Average Pain 8 Pain Right Now 7 My pain is na  In the last 24 hours, has pain interfered with the following? General activity 7 Relation with others 8 Enjoyment of life 8 What TIME of day is your pain at its worst? daytime and night Sleep (in general) Good  Pain is worse with: bending and some activites Pain improves with: rest and medication Relief from Meds: 8  Mobility walk without assistance how many minutes can you walk? 15 ability to climb steps?  yes do you drive?  yes  Function retired Do you have any goals in this area?  yes  Neuro/Psych numbness depression  Prior Studies Any changes since last visit?  no  Physicians involved in your care Any changes since last visit?  no   Family History  Problem Relation Age of Onset  . Hypertension Mother   . Diabetes Mother   . Stroke Mother        multiple  . Hypertension Father   . Stroke Father   . Stroke Sister   . Stroke Daughter    Social History   Socioeconomic History  . Marital status: Married    Spouse name: Not on file  . Number of children: Not on file  . Years of education: Not on file  . Highest education level: Not on file  Occupational History  . Not on file  Social Needs  . Financial resource strain: Not on file  . Food insecurity:    Worry:  Not on file    Inability: Not on file  . Transportation needs:    Medical: Not on file    Non-medical: Not on file  Tobacco Use  . Smoking status: Never Smoker  . Smokeless tobacco: Never Used  Substance and Sexual Activity  . Alcohol use: No    Alcohol/week: 0.0 standard drinks  . Drug use: No  . Sexual activity: Not on file  Lifestyle  . Physical activity:    Days per week: Not on file    Minutes per session: Not on file  . Stress: Not on file  Relationships  . Social connections:    Talks on phone: Not on file    Gets together: Not on file    Attends religious service: Not on file    Active member of club or organization: Not on file    Attends meetings of clubs or organizations: Not on file    Relationship status: Not on file  Other Topics Concern  . Not on file  Social History Narrative  . Not on file   Past Surgical History:  Procedure Laterality Date  . CARDIAC SURGERY  2003   stent  . IMPLANTATION VAGAL NERVE STIMULATOR    . TRIGGER FINGER RELEASE    . TUMOR REMOVAL  2002  S1 tumor removal by Dr. Annette Stable  . VAGAL NERVE STIMULATOR REMOVAL     Past Medical History:  Diagnosis Date  . Arthritis   . Diabetes mellitus   . High blood pressure   . Plantar fasciitis    There were no vitals taken for this visit.  Opioid Risk Score:   Fall Risk Score:  `1  Depression screen PHQ 2/9  Depression screen [facility] 2/9 01/29/2018 12/31/2017 11/28/2017 08/27/2017 04/30/2017 02/27/2017 01/30/2017  Decreased Interest 0 0 0 0 0 0 0  Down, Depressed, Hopeless 1 1 0 0 0 0 0  PHQ - 2 Score 1 1 0 0 0 0 0  Altered sleeping - - - - - - -  Tired, decreased energy - - - - - - -  Change in appetite - - - - - - -  Feeling bad or failure about yourself  - - - - - - -  Trouble concentrating - - - - - - -  Moving slowly or fidgety/restless - - - - - - -  Suicidal thoughts - - - - - - -  PHQ-9 Score - - - - - - -   Review of Systems  Constitutional: Negative.   HENT: Negative.   Eyes:  Negative.   Respiratory: Positive for apnea.   Cardiovascular: Negative.   Gastrointestinal: Negative.   Endocrine:       High blood sugars  Genitourinary: Negative.   Musculoskeletal: Negative.   Skin: Negative.   Allergic/Immunologic: Negative.   Neurological: Positive for numbness.  Hematological: Negative.   Psychiatric/Behavioral: Positive for dysphoric mood.  All other systems reviewed and are negative.      Objective:   Physical Exam  Constitutional: He is oriented to person, place, and time. He appears well-developed and well-nourished. No distress.  HENT:  Head: Normocephalic and atraumatic.  HOH  Eyes: Pupils are equal, round, and reactive to light. EOM are normal.  Feet:  Right Foot:  Skin Integrity: Negative for ulcer, blister, skin breakdown, erythema or warmth.  Left Foot:  Skin Integrity: Negative for ulcer, blister, skin breakdown, erythema or warmth.  Neurological: He is alert and oriented to person, place, and time. A sensory deficit is present. Gait normal.  Reflex Scores:      Patellar reflexes are 2+ on the right side and 2+ on the left side.      Achilles reflexes are 0 on the right side and 1+ on the left side. Reduced R L5 and S1  No evidence of R foot drop  Skin: Skin is warm and dry. He is not diaphoretic.  Nursing note and vitals reviewed.         Assessment & Plan:  1.  Hx of Schwannoma with chronic R L5, S1 radiculopathy, well controlled on Gabapentin and Oxycodone  We discussed that while his current regimen is helpful, he may potentially get equal or better relief with a combination of buprenorphine and pregabalin.  We discussed that it would also allow him to attend clinic every 3 months rather than monthly.  He will discuss this with nurse practitioner next month.  I discussed that from insurance standpoint I cannot tell him what the cost would be compared to his current regimen  Indication for chronic opioid: See above Medication and  dose: Oxycodone 15mg  TID # pills per month: 90 Last UDS date: 10/30/17 Opioid Treatment Agreement signed (Y/N): y Opioid Treatment Agreement last reviewed with patient: 07/02/2017  8:22 AM [facility] reviewed this  encounter (include red flags):

## 2018-02-01 ENCOUNTER — Telehealth: Payer: Self-pay | Admitting: *Deleted

## 2018-02-01 MED ORDER — OXYCODONE HCL 15 MG PO TABS: 15.0000 mg | ORAL_TABLET | Freq: Three times a day (TID) | ORAL | 0 refills | Status: DC | PRN

## 2018-02-01 NOTE — Addendum Note (Signed)
Addended by: Charlett Blake on: 02/01/2018 03:46 PM   Modules accepted: Orders

## 2018-02-01 NOTE — Telephone Encounter (Signed)
Notified Mrs Jeff that refill sent to the pharmacy.

## 2018-02-01 NOTE — Telephone Encounter (Signed)
Troy [name] wife called and states Dr Letta Pate did not send his oxycodone refill to [facility] in [place].  He is about out of medication. Please refill.

## 2018-02-26 ENCOUNTER — Encounter: Payer: Medicare Other | Attending: Physical Medicine & Rehabilitation | Admitting: Registered Nurse

## 2018-02-26 ENCOUNTER — Encounter: Payer: Self-pay | Admitting: Registered Nurse

## 2018-02-26 VITALS — BP 116/58 | HR 66 | Resp 16 | Ht 67.0 in | Wt 166.0 lb

## 2018-02-26 DIAGNOSIS — Z79891 Long term (current) use of opiate analgesic: Secondary | ICD-10-CM

## 2018-02-26 DIAGNOSIS — G8929 Other chronic pain: Secondary | ICD-10-CM | POA: Insufficient documentation

## 2018-02-26 DIAGNOSIS — E1140 Type 2 diabetes mellitus with diabetic neuropathy, unspecified: Secondary | ICD-10-CM

## 2018-02-26 DIAGNOSIS — M5418 Radiculopathy, sacral and sacrococcygeal region: Secondary | ICD-10-CM | POA: Insufficient documentation

## 2018-02-26 DIAGNOSIS — Z86018 Personal history of other benign neoplasm: Secondary | ICD-10-CM

## 2018-02-26 DIAGNOSIS — D3610 Benign neoplasm of peripheral nerves and autonomic nervous system, unspecified: Secondary | ICD-10-CM | POA: Insufficient documentation

## 2018-02-26 DIAGNOSIS — M79671 Pain in right foot: Secondary | ICD-10-CM | POA: Diagnosis present

## 2018-02-26 DIAGNOSIS — E1149 Type 2 diabetes mellitus with other diabetic neurological complication: Secondary | ICD-10-CM

## 2018-02-26 DIAGNOSIS — E1342 Other specified diabetes mellitus with diabetic polyneuropathy: Secondary | ICD-10-CM | POA: Insufficient documentation

## 2018-02-26 DIAGNOSIS — Z5181 Encounter for therapeutic drug level monitoring: Secondary | ICD-10-CM

## 2018-02-26 DIAGNOSIS — G894 Chronic pain syndrome: Secondary | ICD-10-CM | POA: Diagnosis not present

## 2018-02-26 DIAGNOSIS — Z760 Encounter for issue of repeat prescription: Secondary | ICD-10-CM | POA: Diagnosis not present

## 2018-02-26 MED ORDER — OXYCODONE HCL 15 MG PO TABS: 15.0000 mg | ORAL_TABLET | Freq: Three times a day (TID) | ORAL | 0 refills | Status: DC | PRN

## 2018-02-26 NOTE — Progress Notes (Signed)
Subjective:    Patient ID: Troy [name], male    DOB: 15-Jun-1945, 73 y.o.   MRN: [id number]  HPI: Troy [name] is a 73 y.o. male who returns for follow up appointment for chronic pain and medication refill. He states his pain is located in his right foot. He rates his pain 7. His current exercise regime is walking and performing stretching exercises.  Troy [name] Morphine equivalent is 67.50 MME.  Last UDS was Performed on 10/30/2017, it was consistent.   Pain Inventory Average Pain 8 Pain Right Now 7 My pain is aching  In the last 24 hours, has pain interfered with the following? General activity 7 Relation with others 7 Enjoyment of life 8 What TIME of day is your pain at its worst? morning, evening Sleep (in general) Good  Pain is worse with: bending and some activites Pain improves with: rest and medication Relief from Meds: 8  Mobility walk without assistance how many minutes can you walk? 15 ability to climb steps?  yes do you drive?  yes  Function retired Do you have any goals in this area?  yes  Neuro/Psych numbness depression  Prior Studies Any changes since last visit?  no  Physicians involved in your care Any changes since last visit?  no   Family History  Problem Relation Age of Onset  . Hypertension Mother   . Diabetes Mother   . Stroke Mother        multiple  . Hypertension Father   . Stroke Father   . Stroke Sister   . Stroke Daughter    Social History   Socioeconomic History  . Marital status: Married    Spouse name: Not on file  . Number of children: Not on file  . Years of education: Not on file  . Highest education level: Not on file  Occupational History  . Not on file  Social Needs  . Financial resource strain: Not on file  . Food insecurity:    Worry: Not on file    Inability: Not on file  . Transportation needs:    Medical: Not on file    Non-medical: Not on file  Tobacco Use  . Smoking status: Never Smoker  .  Smokeless tobacco: Never Used  Substance and Sexual Activity  . Alcohol use: No    Alcohol/week: 0.0 standard drinks  . Drug use: No  . Sexual activity: Not on file  Lifestyle  . Physical activity:    Days per week: Not on file    Minutes per session: Not on file  . Stress: Not on file  Relationships  . Social connections:    Talks on phone: Not on file    Gets together: Not on file    Attends religious service: Not on file    Active member of club or organization: Not on file    Attends meetings of clubs or organizations: Not on file    Relationship status: Not on file  Other Topics Concern  . Not on file  Social History Narrative  . Not on file   Past Surgical History:  Procedure Laterality Date  . CARDIAC SURGERY  2003   stent  . IMPLANTATION VAGAL NERVE STIMULATOR    . TRIGGER FINGER RELEASE    . TUMOR REMOVAL  2002   S1 tumor removal by Dr. Annette Stable  . VAGAL NERVE STIMULATOR REMOVAL     Past Medical History:  Diagnosis Date  . Arthritis   .  Diabetes mellitus   . High blood pressure   . Plantar fasciitis    BP (!) 116/58   Pulse 66   Resp 16   Ht 5\' 7"  (1.702 m)   Wt 166 lb (75.3 kg)   SpO2 96%   BMI 26.00 kg/m   Opioid Risk Score:   Fall Risk Score:  `1  Depression screen PHQ 2/9  Depression screen [facility] 2/9 01/29/2018 12/31/2017 11/28/2017 08/27/2017 04/30/2017 02/27/2017 01/30/2017  Decreased Interest 0 0 0 0 0 0 0  Down, Depressed, Hopeless 1 1 0 0 0 0 0  PHQ - 2 Score 1 1 0 0 0 0 0  Altered sleeping - - - - - - -  Tired, decreased energy - - - - - - -  Change in appetite - - - - - - -  Feeling bad or failure about yourself  - - - - - - -  Trouble concentrating - - - - - - -  Moving slowly or fidgety/restless - - - - - - -  Suicidal thoughts - - - - - - -  PHQ-9 Score - - - - - - -    Review of Systems  Constitutional: Negative.   HENT: Negative.   Eyes: Negative.   Respiratory: Negative.   Cardiovascular: Negative.   Gastrointestinal: Negative.    Endocrine: Negative.   Genitourinary: Negative.   Musculoskeletal:       Feet pain  Skin: Negative.   Allergic/Immunologic: Negative.   Neurological: Positive for numbness.  Hematological: Negative.   Psychiatric/Behavioral: Positive for dysphoric mood.  All other systems reviewed and are negative.      Objective:   Physical Exam Vitals signs and nursing note reviewed.  Constitutional:      Appearance: Normal appearance.  Neck:     Musculoskeletal: Normal range of motion and neck supple.  Cardiovascular:     Rate and Rhythm: Normal rate and regular rhythm.     Pulses: Normal pulses.     Heart sounds: Normal heart sounds.  Pulmonary:     Effort: Pulmonary effort is normal.     Breath sounds: Normal breath sounds.  Musculoskeletal:     Comments: Normal Muscle Bulk and Muscle Testing Reveals:  Upper Extremities: Full ROM and Muscle Strength 5/5  Lower Extremities: Full ROM and Muscle Strength 5/5 Right Lower Extremity Flexion Produces Pain into right foot.  Arises from chair with ease Narrow Based Gait   Skin:    General: Skin is warm and dry.  Neurological:     Mental Status: He is alert and oriented to person, place, and time.  Psychiatric:        Mood and Affect: Mood normal.        Behavior: Behavior normal.           Assessment & Plan:  1. 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  We will continue the opioid monitoring program, this consists of regular clinic visits, examinations, urine drug screen, pill counts as well as use of [place] Controlled Substance Reporting System. 2. Diabetic peripheral neuropathy affecting both feet: Continue current medication regimen with Gabapentin PCP Following. 02/26/2018 3. Chronic Bilateral Low Back Pain:No complaints todayContinuecurrent treatment regimenwith HEP as Tolerated. Continue  current medication regimen.02/26/2018  15 minutes of face to face patient care time was spent during this visit. All questions were encouraged and answered.  F/U in 1 month

## 2018-03-03 LAB — TOXASSURE SELECT,+ANTIDEPR,UR

## 2018-03-04 ENCOUNTER — Telehealth: Payer: Self-pay | Admitting: *Deleted

## 2018-03-04 NOTE — Telephone Encounter (Signed)
Urine drug screen for this encounter is consistent for prescribed medication 

## 2018-04-01 ENCOUNTER — Encounter: Payer: Medicare Other | Attending: Physical Medicine & Rehabilitation | Admitting: Registered Nurse

## 2018-04-01 ENCOUNTER — Encounter: Payer: Self-pay | Admitting: Registered Nurse

## 2018-04-01 VITALS — BP 122/63 | HR 63 | Ht 67.0 in | Wt 169.0 lb

## 2018-04-01 DIAGNOSIS — Z5181 Encounter for therapeutic drug level monitoring: Secondary | ICD-10-CM | POA: Diagnosis not present

## 2018-04-01 DIAGNOSIS — Z86018 Personal history of other benign neoplasm: Secondary | ICD-10-CM

## 2018-04-01 DIAGNOSIS — G8929 Other chronic pain: Secondary | ICD-10-CM | POA: Diagnosis not present

## 2018-04-01 DIAGNOSIS — E1342 Other specified diabetes mellitus with diabetic polyneuropathy: Secondary | ICD-10-CM | POA: Insufficient documentation

## 2018-04-01 DIAGNOSIS — E1140 Type 2 diabetes mellitus with diabetic neuropathy, unspecified: Secondary | ICD-10-CM | POA: Diagnosis not present

## 2018-04-01 DIAGNOSIS — E1149 Type 2 diabetes mellitus with other diabetic neurological complication: Secondary | ICD-10-CM

## 2018-04-01 DIAGNOSIS — D3610 Benign neoplasm of peripheral nerves and autonomic nervous system, unspecified: Secondary | ICD-10-CM | POA: Diagnosis not present

## 2018-04-01 DIAGNOSIS — M79671 Pain in right foot: Secondary | ICD-10-CM | POA: Diagnosis present

## 2018-04-01 DIAGNOSIS — G894 Chronic pain syndrome: Secondary | ICD-10-CM | POA: Diagnosis not present

## 2018-04-01 DIAGNOSIS — M5418 Radiculopathy, sacral and sacrococcygeal region: Secondary | ICD-10-CM | POA: Diagnosis not present

## 2018-04-01 DIAGNOSIS — Z79891 Long term (current) use of opiate analgesic: Secondary | ICD-10-CM

## 2018-04-01 DIAGNOSIS — Z760 Encounter for issue of repeat prescription: Secondary | ICD-10-CM | POA: Diagnosis not present

## 2018-04-01 MED ORDER — OXYCODONE HCL 15 MG PO TABS: 15.0000 mg | ORAL_TABLET | Freq: Three times a day (TID) | ORAL | 0 refills | Status: DC | PRN

## 2018-04-01 NOTE — Progress Notes (Signed)
Subjective:    Patient ID: Troy [name], male    DOB: October 24, 1945, 73 y.o.   MRN: [id number]  HPI: Troy [name] is a 73 y.o. male who returns for follow up appointment for chronic pain and medication refill. He states his pain is located in his right foot. He rates his pain 8. His current exercise regime is walking and performing stretching exercises.  [name]  Morphine equivalent is 67.50  MME. Last UDS was Performed on 10/30/2017, it was consistent.   Pain Inventory Average Pain 8 Pain Right Now 8 My pain is na  In the last 24 hours, has pain interfered with the following? General activity 7 Relation with others 7 Enjoyment of life 7 What TIME of day is your pain at its worst? daytime Sleep (in general) Good  Pain is worse with: bending and some activites Pain improves with: rest and medication Relief from Meds: 8  Mobility ability to climb steps?  yes do you drive?  yes  Function retired  Neuro/Psych numbness depression  Prior Studies Any changes since last visit?  no  Physicians involved in your care Any changes since last visit?  no   Family History  Problem Relation Age of Onset  . Hypertension Mother   . Diabetes Mother   . Stroke Mother        multiple  . Hypertension Father   . Stroke Father   . Stroke Sister   . Stroke Daughter    Social History   Socioeconomic History  . Marital status: Married    Spouse name: Not on file  . Number of children: Not on file  . Years of education: Not on file  . Highest education level: Not on file  Occupational History  . Not on file  Social Needs  . Financial resource strain: Not on file  . Food insecurity:    Worry: Not on file    Inability: Not on file  . Transportation needs:    Medical: Not on file    Non-medical: Not on file  Tobacco Use  . Smoking status: Never Smoker  . Smokeless tobacco: Never Used  Substance and Sexual Activity  . Alcohol use: No    Alcohol/week: 0.0 standard drinks    . Drug use: No  . Sexual activity: Not on file  Lifestyle  . Physical activity:    Days per week: Not on file    Minutes per session: Not on file  . Stress: Not on file  Relationships  . Social connections:    Talks on phone: Not on file    Gets together: Not on file    Attends religious service: Not on file    Active member of club or organization: Not on file    Attends meetings of clubs or organizations: Not on file    Relationship status: Not on file  Other Topics Concern  . Not on file  Social History Narrative  . Not on file   Past Surgical History:  Procedure Laterality Date  . CARDIAC SURGERY  2003   stent  . IMPLANTATION VAGAL NERVE STIMULATOR    . TRIGGER FINGER RELEASE    . TUMOR REMOVAL  2002   S1 tumor removal by Dr. Annette Stable  . VAGAL NERVE STIMULATOR REMOVAL     Past Medical History:  Diagnosis Date  . Arthritis   . Diabetes mellitus   . High blood pressure   . Plantar fasciitis    BP 96/60  Pulse 62   Ht 5\' 7"  (1.702 m)   Wt 169 lb (76.7 kg)   SpO2 94%   BMI 26.47 kg/m   Opioid Risk Score:   Fall Risk Score:  `1  Depression screen PHQ 2/9  Depression screen [facility] 2/9 01/29/2018 12/31/2017 11/28/2017 08/27/2017 04/30/2017 02/27/2017 01/30/2017  Decreased Interest 0 0 0 0 0 0 0  Down, Depressed, Hopeless 1 1 0 0 0 0 0  PHQ - 2 Score 1 1 0 0 0 0 0  Altered sleeping - - - - - - -  Tired, decreased energy - - - - - - -  Change in appetite - - - - - - -  Feeling bad or failure about yourself  - - - - - - -  Trouble concentrating - - - - - - -  Moving slowly or fidgety/restless - - - - - - -  Suicidal thoughts - - - - - - -  PHQ-9 Score - - - - - - -     Review of Systems  Constitutional: Negative.   HENT: Negative.   Eyes: Negative.   Respiratory: Positive for apnea.   Cardiovascular: Negative.   Gastrointestinal: Negative.   Endocrine: Negative.   Genitourinary: Negative.   Musculoskeletal: Positive for arthralgias and myalgias.  Skin:  Negative.   Allergic/Immunologic: Negative.   Neurological: Positive for numbness.  Hematological: Negative.   Psychiatric/Behavioral: Positive for dysphoric mood.  All other systems reviewed and are negative.      Objective:   Physical Exam Vitals signs and nursing note reviewed.  Constitutional:      Appearance: Normal appearance.  Neck:     Musculoskeletal: Normal range of motion and neck supple.  Cardiovascular:     Rate and Rhythm: Normal rate and regular rhythm.     Pulses: Normal pulses.     Heart sounds: Normal heart sounds.  Pulmonary:     Effort: Pulmonary effort is normal.     Breath sounds: Normal breath sounds.  Musculoskeletal:     Comments: Normal Muscle Bulk and Muscle Testing Reveals:  Upper Extremities:Full  ROM and Muscle Strength 5/5  Lower Extremities: Full ROM and Muscle Strength 5/5 Arises from Chair with ease Narrow Based Gait   Skin:    General: Skin is warm and dry.  Neurological:     Mental Status: He is alert and oriented to person, place, and time.  Psychiatric:        Mood and Affect: Mood normal.        Behavior: Behavior normal.           Assessment & Plan:  1. 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  We will continue the opioid monitoring program, this consists of regular clinic visits, examinations, urine drug screen, pill counts as well as use of [place] Controlled Substance Reporting System. 2. Diabetic peripheral neuropathy affecting both feet: Continue current medication regimen with Gabapentin PCP Following. 04/01/2018  15 minutes of face to face patient care time was spent during this visit. All questions were encouraged and answered.  F/U in 1 month

## 2018-04-30 ENCOUNTER — Encounter: Payer: Medicare Other | Attending: Physical Medicine & Rehabilitation | Admitting: Registered Nurse

## 2018-04-30 ENCOUNTER — Encounter: Payer: Self-pay | Admitting: Registered Nurse

## 2018-04-30 VITALS — BP 142/81 | HR 65 | Ht 67.0 in | Wt 164.0 lb

## 2018-04-30 DIAGNOSIS — E1342 Other specified diabetes mellitus with diabetic polyneuropathy: Secondary | ICD-10-CM | POA: Insufficient documentation

## 2018-04-30 DIAGNOSIS — E1149 Type 2 diabetes mellitus with other diabetic neurological complication: Secondary | ICD-10-CM

## 2018-04-30 DIAGNOSIS — G8929 Other chronic pain: Secondary | ICD-10-CM | POA: Diagnosis not present

## 2018-04-30 DIAGNOSIS — Z86018 Personal history of other benign neoplasm: Secondary | ICD-10-CM | POA: Diagnosis not present

## 2018-04-30 DIAGNOSIS — M5418 Radiculopathy, sacral and sacrococcygeal region: Secondary | ICD-10-CM | POA: Insufficient documentation

## 2018-04-30 DIAGNOSIS — Z760 Encounter for issue of repeat prescription: Secondary | ICD-10-CM | POA: Insufficient documentation

## 2018-04-30 DIAGNOSIS — Z5181 Encounter for therapeutic drug level monitoring: Secondary | ICD-10-CM | POA: Diagnosis not present

## 2018-04-30 DIAGNOSIS — E1140 Type 2 diabetes mellitus with diabetic neuropathy, unspecified: Secondary | ICD-10-CM | POA: Diagnosis not present

## 2018-04-30 DIAGNOSIS — D3610 Benign neoplasm of peripheral nerves and autonomic nervous system, unspecified: Secondary | ICD-10-CM | POA: Insufficient documentation

## 2018-04-30 DIAGNOSIS — G894 Chronic pain syndrome: Secondary | ICD-10-CM

## 2018-04-30 DIAGNOSIS — M79671 Pain in right foot: Secondary | ICD-10-CM | POA: Insufficient documentation

## 2018-04-30 DIAGNOSIS — Z79891 Long term (current) use of opiate analgesic: Secondary | ICD-10-CM

## 2018-04-30 MED ORDER — OXYCODONE HCL 15 MG PO TABS: 15.0000 mg | ORAL_TABLET | Freq: Three times a day (TID) | ORAL | 0 refills | Status: DC | PRN

## 2018-04-30 NOTE — Progress Notes (Signed)
Subjective:    Patient ID: Troy [name], male    DOB: 09-Jan-1946, 73 y.o.   MRN: [id number]  HPI: Troy [name] is a 73 y.o. male who returns for follow up appointment for chronic pain and medication refill. He states his pain is located in his right foot. He rates his pain 8. His current exercise regime is walking and performing stretching exercises.  [name] Morphine equivalent is 67.50 MME.  Last UDS was Performed on 02/26/2018, it was consistent.   Pain Inventory Average Pain 10 Pain Right Now 8 My pain is aching and .  In the last 24 hours, has pain interfered with the following? General activity 7 Relation with others 8 Enjoyment of life 8 What TIME of day is your pain at its worst? evening Sleep (in general) Fair  Pain is worse with: walking and bending Pain improves with: rest and medication Relief from Meds: 8  Mobility walk without assistance  Function retired  Neuro/Psych numbness depression  Prior Studies Any changes since last visit?  no  Physicians involved in your care Any changes since last visit?  no   Family History  Problem Relation Age of Onset  . Hypertension Mother   . Diabetes Mother   . Stroke Mother        multiple  . Hypertension Father   . Stroke Father   . Stroke Sister   . Stroke Daughter    Social History   Socioeconomic History  . Marital status: Married    Spouse name: Not on file  . Number of children: Not on file  . Years of education: Not on file  . Highest education level: Not on file  Occupational History  . Not on file  Social Needs  . Financial resource strain: Not on file  . Food insecurity:    Worry: Not on file    Inability: Not on file  . Transportation needs:    Medical: Not on file    Non-medical: Not on file  Tobacco Use  . Smoking status: Never Smoker  . Smokeless tobacco: Never Used  Substance and Sexual Activity  . Alcohol use: No    Alcohol/week: 0.0 standard drinks  . Drug use: No  .  Sexual activity: Not on file  Lifestyle  . Physical activity:    Days per week: Not on file    Minutes per session: Not on file  . Stress: Not on file  Relationships  . Social connections:    Talks on phone: Not on file    Gets together: Not on file    Attends religious service: Not on file    Active member of club or organization: Not on file    Attends meetings of clubs or organizations: Not on file    Relationship status: Not on file  Other Topics Concern  . Not on file  Social History Narrative  . Not on file   Past Surgical History:  Procedure Laterality Date  . CARDIAC SURGERY  2003   stent  . IMPLANTATION VAGAL NERVE STIMULATOR    . TRIGGER FINGER RELEASE    . TUMOR REMOVAL  2002   S1 tumor removal by Dr. Annette Stable  . VAGAL NERVE STIMULATOR REMOVAL     Past Medical History:  Diagnosis Date  . Arthritis   . Diabetes mellitus   . High blood pressure   . Plantar fasciitis    There were no vitals taken for this visit.  Opioid Risk  Score:   Fall Risk Score:  `1  Depression screen PHQ 2/9  Depression screen [facility] 2/9 01/29/2018 12/31/2017 11/28/2017 08/27/2017 04/30/2017 02/27/2017 01/30/2017  Decreased Interest 0 0 0 0 0 0 0  Down, Depressed, Hopeless 1 1 0 0 0 0 0  PHQ - 2 Score 1 1 0 0 0 0 0  Altered sleeping - - - - - - -  Tired, decreased energy - - - - - - -  Change in appetite - - - - - - -  Feeling bad or failure about yourself  - - - - - - -  Trouble concentrating - - - - - - -  Moving slowly or fidgety/restless - - - - - - -  Suicidal thoughts - - - - - - -  PHQ-9 Score - - - - - - -     Review of Systems  Constitutional: Negative.   HENT: Negative.   Eyes: Negative.   Respiratory: Negative.   Cardiovascular: Negative.   Gastrointestinal: Negative.   Endocrine: Negative.   Genitourinary: Positive for difficulty urinating.  Musculoskeletal: Positive for arthralgias.  Skin: Negative.   Allergic/Immunologic: Negative.   Neurological: Positive for  numbness.  Hematological: Negative.   Psychiatric/Behavioral: Positive for dysphoric mood.  All other systems reviewed and are negative.      Objective:   Physical Exam Vitals signs and nursing note reviewed.  Constitutional:      Appearance: Normal appearance.  Neck:     Musculoskeletal: Normal range of motion and neck supple.  Cardiovascular:     Rate and Rhythm: Normal rate and regular rhythm.     Pulses: Normal pulses.     Heart sounds: Normal heart sounds.  Pulmonary:     Effort: Pulmonary effort is normal.     Breath sounds: Normal breath sounds.  Musculoskeletal:     Comments: Normal Muscle Bulk and Muscle Testing Reveals:  Upper Extremities: Full ROM and Muscle Strength 5/5 Lower Extremities: Full ROM and Muscle Strength 5/5  Arises from chair with ease Narrow Based Gait   Skin:    General: Skin is warm and dry.  Neurological:     Mental Status: He is alert and oriented to person, place, and time.  Psychiatric:        Mood and Affect: Mood normal.        Behavior: Behavior normal.           Assessment & Plan:  1. 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  We will continue the opioid monitoring program, this consists of regular clinic visits, examinations, urine drug screen, pill counts as well as use of [place] Controlled Substance Reporting System. 2. Diabetic peripheral neuropathy affecting both feet: Continue current medication regimen with Gabapentin PCP Following. 04/30/2018  15 minutes of face to face patient care time was spent during this visit. All questions were encouraged and answered.  F/U in 1 month

## 2018-05-30 ENCOUNTER — Encounter: Payer: Self-pay | Admitting: Registered Nurse

## 2018-05-30 ENCOUNTER — Encounter: Payer: Medicare Other | Attending: Physical Medicine & Rehabilitation | Admitting: Registered Nurse

## 2018-05-30 ENCOUNTER — Other Ambulatory Visit: Payer: Self-pay

## 2018-05-30 VITALS — BP 199/76 | HR 76 | Ht 67.0 in | Wt 170.0 lb

## 2018-05-30 DIAGNOSIS — E1149 Type 2 diabetes mellitus with other diabetic neurological complication: Secondary | ICD-10-CM

## 2018-05-30 DIAGNOSIS — Z760 Encounter for issue of repeat prescription: Secondary | ICD-10-CM | POA: Diagnosis not present

## 2018-05-30 DIAGNOSIS — G894 Chronic pain syndrome: Secondary | ICD-10-CM

## 2018-05-30 DIAGNOSIS — M5418 Radiculopathy, sacral and sacrococcygeal region: Secondary | ICD-10-CM | POA: Diagnosis not present

## 2018-05-30 DIAGNOSIS — E1342 Other specified diabetes mellitus with diabetic polyneuropathy: Secondary | ICD-10-CM | POA: Insufficient documentation

## 2018-05-30 DIAGNOSIS — D3610 Benign neoplasm of peripheral nerves and autonomic nervous system, unspecified: Secondary | ICD-10-CM | POA: Insufficient documentation

## 2018-05-30 DIAGNOSIS — Z79891 Long term (current) use of opiate analgesic: Secondary | ICD-10-CM

## 2018-05-30 DIAGNOSIS — M79671 Pain in right foot: Secondary | ICD-10-CM | POA: Diagnosis present

## 2018-05-30 DIAGNOSIS — E1140 Type 2 diabetes mellitus with diabetic neuropathy, unspecified: Secondary | ICD-10-CM | POA: Diagnosis not present

## 2018-05-30 DIAGNOSIS — G8929 Other chronic pain: Secondary | ICD-10-CM | POA: Insufficient documentation

## 2018-05-30 DIAGNOSIS — Z5181 Encounter for therapeutic drug level monitoring: Secondary | ICD-10-CM

## 2018-05-30 DIAGNOSIS — I10 Essential (primary) hypertension: Secondary | ICD-10-CM

## 2018-05-30 DIAGNOSIS — Z86018 Personal history of other benign neoplasm: Secondary | ICD-10-CM | POA: Diagnosis not present

## 2018-05-30 MED ORDER — OXYCODONE HCL 15 MG PO TABS: 15.0000 mg | ORAL_TABLET | Freq: Three times a day (TID) | ORAL | 0 refills | Status: DC | PRN

## 2018-05-30 NOTE — Progress Notes (Signed)
Subjective:    Patient ID: Troy [name], male    DOB: 1945-04-03, 73 y.o.   MRN: [id number]  HPI: Troy [name] is a 73 y.o. male who returns for follow up appointment for chronic pain and medication refill. He states his pain is located in his right foot. He rates his pain 8. His current exercise regime is walking, light yard work  and performing stretching exercises.  Mr. Kottke Morphine equivalent is 67.50 MME.  Last UDS was performed on 02/26/2018, it was consistent.   Pain Inventory Average Pain 8 Pain Right Now 8 My pain is aching  In the last 24 hours, has pain interfered with the following? General activity 7 Relation with others 7 Enjoyment of life 8 What TIME of day is your pain at its worst? morning and daytime Sleep (in general) Good  Pain is worse with: walking, bending and some activites Pain improves with: rest and medication Relief from Meds: 8  Mobility how many minutes can you walk? 20 ability to climb steps?  yes do you drive?  yes  Function retired  Neuro/Psych numbness depression  Prior Studies Any changes since last visit?  no  Physicians involved in your care Any changes since last visit?  no   Family History  Problem Relation Age of Onset  . Hypertension Mother   . Diabetes Mother   . Stroke Mother        multiple  . Hypertension Father   . Stroke Father   . Stroke Sister   . Stroke Daughter    Social History   Socioeconomic History  . Marital status: Married    Spouse name: Not on file  . Number of children: Not on file  . Years of education: Not on file  . Highest education level: Not on file  Occupational History  . Not on file  Social Needs  . Financial resource strain: Not on file  . Food insecurity:    Worry: Not on file    Inability: Not on file  . Transportation needs:    Medical: Not on file    Non-medical: Not on file  Tobacco Use  . Smoking status: Never Smoker  . Smokeless tobacco: Never Used  Substance  and Sexual Activity  . Alcohol use: No    Alcohol/week: 0.0 standard drinks  . Drug use: No  . Sexual activity: Not on file  Lifestyle  . Physical activity:    Days per week: Not on file    Minutes per session: Not on file  . Stress: Not on file  Relationships  . Social connections:    Talks on phone: Not on file    Gets together: Not on file    Attends religious service: Not on file    Active member of club or organization: Not on file    Attends meetings of clubs or organizations: Not on file    Relationship status: Not on file  Other Topics Concern  . Not on file  Social History Narrative  . Not on file   Past Surgical History:  Procedure Laterality Date  . CARDIAC SURGERY  2003   stent  . IMPLANTATION VAGAL NERVE STIMULATOR    . TRIGGER FINGER RELEASE    . TUMOR REMOVAL  2002   S1 tumor removal by Dr. Annette Stable  . VAGAL NERVE STIMULATOR REMOVAL     Past Medical History:  Diagnosis Date  . Arthritis   . Diabetes mellitus   . High  blood pressure   . Plantar fasciitis    BP (!) 193/93   Pulse 75   Ht 5\' 7"  (1.702 m)   Wt 170 lb (77.1 kg)   SpO2 96%   BMI 26.63 kg/m   Opioid Risk Score:   Fall Risk Score:  `1  Depression screen PHQ 2/9  Depression screen [facility] 2/9 01/29/2018 12/31/2017 11/28/2017 08/27/2017 04/30/2017 02/27/2017 01/30/2017  Decreased Interest 0 0 0 0 0 0 0  Down, Depressed, Hopeless 1 1 0 0 0 0 0  PHQ - 2 Score 1 1 0 0 0 0 0  Altered sleeping - - - - - - -  Tired, decreased energy - - - - - - -  Change in appetite - - - - - - -  Feeling bad or failure about yourself  - - - - - - -  Trouble concentrating - - - - - - -  Moving slowly or fidgety/restless - - - - - - -  Suicidal thoughts - - - - - - -  PHQ-9 Score - - - - - - -    Review of Systems  Constitutional: Negative.   HENT: Negative.   Eyes: Negative.   Respiratory: Positive for apnea.   Cardiovascular: Negative.   Gastrointestinal: Negative.   Endocrine: Negative.   Genitourinary:  Negative.   Musculoskeletal: Negative.   Skin: Negative.   Allergic/Immunologic: Negative.   Neurological: Negative.   Hematological: Negative.   Psychiatric/Behavioral: Negative.   All other systems reviewed and are negative.      Objective:   Physical Exam Constitutional:      Appearance: Normal appearance.  Neck:     Musculoskeletal: Normal range of motion and neck supple.  Cardiovascular:     Rate and Rhythm: Normal rate and regular rhythm.     Pulses: Normal pulses.     Heart sounds: Normal heart sounds.  Pulmonary:     Effort: Pulmonary effort is normal.     Breath sounds: Normal breath sounds.  Musculoskeletal:     Comments: Normal Muscle Bulk and Muscle Testing Reveals:  Upper Extremities: Full ROM and Muscle Strength 5/5  Lower Extremities: Full ROM and Muscle Strength 5/5 Arise from table with ease Narrow Based Gait   Skin:    General: Skin is warm and dry.  Neurological:     Mental Status: He is alert and oriented to person, place, and time.           Assessment & Plan:  1. 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  We will continue the opioid monitoring program, this consists of regular clinic visits, examinations, urine drug screen, pill counts as well as use of [place] Controlled Substance Reporting System. 2. Diabetic peripheral neuropathy affecting both feet: Continue current medication regimen with Gabapentin PCP Following. 05/30/2018 3. Uncontrolled Hypertension: He states he's compliant with his medication. Refused ED evaluation. He was instructed to keep a blood pressure log, F/U with PCP. He's asymptomatic. Also instructed to take his blood pressure when he goes home and call office with the reading. He verbalizes understanding.   15 minutes of face to face patient care time was spent during this visit. All  questions were encouraged and answered.  F/U in 1 month

## 2018-06-19 ENCOUNTER — Other Ambulatory Visit: Payer: Self-pay

## 2018-06-19 ENCOUNTER — Encounter (HOSPITAL_BASED_OUTPATIENT_CLINIC_OR_DEPARTMENT_OTHER): Payer: Medicare Other | Admitting: Registered Nurse

## 2018-06-19 ENCOUNTER — Encounter: Payer: Self-pay | Admitting: Registered Nurse

## 2018-06-19 VITALS — BP 149/70 | Ht 67.0 in | Wt 170.0 lb

## 2018-06-19 DIAGNOSIS — Z5181 Encounter for therapeutic drug level monitoring: Secondary | ICD-10-CM

## 2018-06-19 DIAGNOSIS — Z86018 Personal history of other benign neoplasm: Secondary | ICD-10-CM

## 2018-06-19 DIAGNOSIS — G894 Chronic pain syndrome: Secondary | ICD-10-CM | POA: Diagnosis not present

## 2018-06-19 DIAGNOSIS — Z79891 Long term (current) use of opiate analgesic: Secondary | ICD-10-CM

## 2018-06-19 DIAGNOSIS — E1140 Type 2 diabetes mellitus with diabetic neuropathy, unspecified: Secondary | ICD-10-CM

## 2018-06-19 DIAGNOSIS — E1149 Type 2 diabetes mellitus with other diabetic neurological complication: Secondary | ICD-10-CM

## 2018-06-19 MED ORDER — OXYCODONE HCL 15 MG PO TABS: 15.0000 mg | ORAL_TABLET | Freq: Three times a day (TID) | ORAL | 0 refills | Status: DC | PRN

## 2018-06-19 NOTE — Progress Notes (Signed)
Subjective:    Patient ID: Troy [name], male    DOB: 1945-12-04, 73 y.o.   MRN: [id number]  HPI: [name] is a 73 y.o. male his appointment was changed, due to national recommendations of social distancing due to [place] 19, an audio/video telehealth visit is felt to be most appropriate for this patient at this time.  See Chart message from today for the patient's consent to telehealth from [name].     He states his pain is located in his right foot. He rates his pain  8. His current   exercise regime is walking and performing outside chores.   [name] Morphine equivalent is 67.50 MME.  Last UDS was Performed  On 02/26/2018, it was consistent.   [name] is [facility], his wife assisted with questions with provider.   Jasmine December CMA asked the Health and History Questions. This provider and Kennon Rounds  verified that we were speaking with the correct person using two identifiers.   Pain Inventory Average Pain 8 Pain Right Now 8 My pain is aching  In the last 24 hours, has pain interfered with the following? General activity 8 Relation with others 8 Enjoyment of life 8 What TIME of day is your pain at its worst? morning and daytime Sleep (in general) Good  Pain is worse with: walking, bending and some activites Pain improves with: rest and medication Relief from Meds: 8  Mobility how many minutes can you walk? 20 ability to climb steps?  yes do you drive?  yes  Function retired  Neuro/Psych numbness depression  Prior Studies Any changes since last visit?  no  Physicians involved in your care Any changes since last visit?  no   Family History  Problem Relation Age of Onset  . Hypertension Mother   . Diabetes Mother   . Stroke Mother        multiple  . Hypertension Father   . Stroke Father   . Stroke Sister   . Stroke Daughter    Social History   Socioeconomic History  . Marital status: Married    Spouse name:  Not on file  . Number of children: Not on file  . Years of education: Not on file  . Highest education level: Not on file  Occupational History  . Not on file  Social Needs  . Financial resource strain: Not on file  . Food insecurity:    Worry: Not on file    Inability: Not on file  . Transportation needs:    Medical: Not on file    Non-medical: Not on file  Tobacco Use  . Smoking status: Never Smoker  . Smokeless tobacco: Never Used  Substance and Sexual Activity  . Alcohol use: No    Alcohol/week: 0.0 standard drinks  . Drug use: No  . Sexual activity: Not on file  Lifestyle  . Physical activity:    Days per week: Not on file    Minutes per session: Not on file  . Stress: Not on file  Relationships  . Social connections:    Talks on phone: Not on file    Gets together: Not on file    Attends religious service: Not on file    Active member of club or organization: Not on file    Attends meetings of clubs or organizations: Not on file    Relationship status: Not on file  Other Topics Concern  . Not on  file  Social History Narrative  . Not on file   Past Surgical History:  Procedure Laterality Date  . CARDIAC SURGERY  2003   stent  . IMPLANTATION VAGAL NERVE STIMULATOR    . TRIGGER FINGER RELEASE    . TUMOR REMOVAL  2002   S1 tumor removal by Dr. Annette Stable  . VAGAL NERVE STIMULATOR REMOVAL     Past Medical History:  Diagnosis Date  . Arthritis   . Diabetes mellitus   . High blood pressure   . Plantar fasciitis    BP (!) 149/70 Comment: pt reported, virtual visit  Ht 5\' 7"  (1.702 m)   Wt 170 lb (77.1 kg)   BMI 26.63 kg/m   Opioid Risk Score:   Fall Risk Score:  `1  Depression screen PHQ 2/9  Depression screen [facility] 2/9 01/29/2018 12/31/2017 11/28/2017 08/27/2017 04/30/2017 02/27/2017 01/30/2017  Decreased Interest 0 0 0 0 0 0 0  Down, Depressed, Hopeless 1 1 0 0 0 0 0  PHQ - 2 Score 1 1 0 0 0 0 0  Altered sleeping - - - - - - -  Tired, decreased energy - - -  - - - -  Change in appetite - - - - - - -  Feeling bad or failure about yourself  - - - - - - -  Trouble concentrating - - - - - - -  Moving slowly or fidgety/restless - - - - - - -  Suicidal thoughts - - - - - - -  PHQ-9 Score - - - - - - -    Review of Systems  Constitutional: Negative.   HENT: Negative.   Eyes: Negative.   Respiratory: Positive for apnea.   Cardiovascular: Negative.   Gastrointestinal: Negative.   Endocrine: Negative.   Genitourinary: Negative.   Musculoskeletal: Negative.   Skin: Negative.   Allergic/Immunologic: Negative.   Neurological: Positive for numbness.  Hematological: Negative.   Psychiatric/Behavioral: Negative.   All other systems reviewed and are negative.      Objective:   Physical Exam Vitals signs and nursing note reviewed.  Musculoskeletal:     Comments: No Physical Exam Perform: Virtual Visit  Neurological:     Mental Status: He is oriented to person, place, and time.           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  We will continue the opioid monitoring program, this consists of regular clinic visits, examinations, urine drug screen, pill counts as well as use of [place] Controlled Substance Reporting System. 2. Diabetic peripheral neuropathy affecting both feet: Continue current medication regimen with Gabapentin PCP Following. 06/19/2018  F/U in 1 month Telephone Call Location of patient: In his Home  Location of provider: Office Established patient Time spent on call: 10 Minutes

## 2018-07-22 ENCOUNTER — Other Ambulatory Visit: Payer: Self-pay

## 2018-07-22 ENCOUNTER — Encounter: Payer: Self-pay | Admitting: Registered Nurse

## 2018-07-22 ENCOUNTER — Encounter: Payer: Medicare Other | Attending: Physical Medicine & Rehabilitation | Admitting: Registered Nurse

## 2018-07-22 VITALS — BP 140/59 | HR 61 | Temp 98.6°F | Ht 67.0 in | Wt 169.0 lb

## 2018-07-22 DIAGNOSIS — G8929 Other chronic pain: Secondary | ICD-10-CM | POA: Diagnosis not present

## 2018-07-22 DIAGNOSIS — E1342 Other specified diabetes mellitus with diabetic polyneuropathy: Secondary | ICD-10-CM | POA: Insufficient documentation

## 2018-07-22 DIAGNOSIS — Z79891 Long term (current) use of opiate analgesic: Secondary | ICD-10-CM

## 2018-07-22 DIAGNOSIS — G894 Chronic pain syndrome: Secondary | ICD-10-CM | POA: Diagnosis not present

## 2018-07-22 DIAGNOSIS — E1149 Type 2 diabetes mellitus with other diabetic neurological complication: Secondary | ICD-10-CM

## 2018-07-22 DIAGNOSIS — M5418 Radiculopathy, sacral and sacrococcygeal region: Secondary | ICD-10-CM | POA: Diagnosis not present

## 2018-07-22 DIAGNOSIS — M79671 Pain in right foot: Secondary | ICD-10-CM | POA: Insufficient documentation

## 2018-07-22 DIAGNOSIS — E1140 Type 2 diabetes mellitus with diabetic neuropathy, unspecified: Secondary | ICD-10-CM

## 2018-07-22 DIAGNOSIS — Z760 Encounter for issue of repeat prescription: Secondary | ICD-10-CM | POA: Diagnosis not present

## 2018-07-22 DIAGNOSIS — D3610 Benign neoplasm of peripheral nerves and autonomic nervous system, unspecified: Secondary | ICD-10-CM | POA: Diagnosis not present

## 2018-07-22 DIAGNOSIS — Z5181 Encounter for therapeutic drug level monitoring: Secondary | ICD-10-CM

## 2018-07-22 DIAGNOSIS — Z86018 Personal history of other benign neoplasm: Secondary | ICD-10-CM | POA: Diagnosis not present

## 2018-07-22 MED ORDER — OXYCODONE HCL 15 MG PO TABS: 15.0000 mg | ORAL_TABLET | Freq: Three times a day (TID) | ORAL | 0 refills | Status: DC | PRN

## 2018-07-22 NOTE — Progress Notes (Signed)
Subjective:    Patient ID: Troy [name], male    DOB: 05-10-1945, 73 y.o.   MRN: [id number]  HPI: Troy [name] is a 73 y.o. male who returns for follow up appointment for chronic pain and medication refill. He states his pain is located in his right foot. He rates his pain 8. His current exercise regime is walking and performing stretching exercises. Troy [name] Morphine equivalent is 67.50 MME.  Last UDs was Performed on 02/26/2018, it was consistent.   Pain Inventory Average Pain 8 Pain Right Now 8 My pain is intermittent and aching  In the last 24 hours, has pain interfered with the following? General activity 8 Relation with others 8 Enjoyment of life 8 What TIME of day is your pain at its worst? daytime Sleep (in general) Good  Pain is worse with: bending Pain improves with: rest and medication Relief from Meds: 8  Mobility ability to climb steps?  yes do you drive?  yes  Function retired  Neuro/Psych numbness depression  Prior Studies Any changes since last visit?  no  Physicians involved in your care Any changes since last visit?  no   Family History  Problem Relation Age of Onset  . Hypertension Mother   . Diabetes Mother   . Stroke Mother        multiple  . Hypertension Father   . Stroke Father   . Stroke Sister   . Stroke Daughter    Social History   Socioeconomic History  . Marital status: Married    Spouse name: Not on file  . Number of children: Not on file  . Years of education: Not on file  . Highest education level: Not on file  Occupational History  . Not on file  Social Needs  . Financial resource strain: Not on file  . Food insecurity:    Worry: Not on file    Inability: Not on file  . Transportation needs:    Medical: Not on file    Non-medical: Not on file  Tobacco Use  . Smoking status: Never Smoker  . Smokeless tobacco: Never Used  Substance and Sexual Activity  . Alcohol use: No    Alcohol/week: 0.0 standard drinks   . Drug use: No  . Sexual activity: Not on file  Lifestyle  . Physical activity:    Days per week: Not on file    Minutes per session: Not on file  . Stress: Not on file  Relationships  . Social connections:    Talks on phone: Not on file    Gets together: Not on file    Attends religious service: Not on file    Active member of club or organization: Not on file    Attends meetings of clubs or organizations: Not on file    Relationship status: Not on file  Other Topics Concern  . Not on file  Social History Narrative  . Not on file   Past Surgical History:  Procedure Laterality Date  . CARDIAC SURGERY  2003   stent  . IMPLANTATION VAGAL NERVE STIMULATOR    . TRIGGER FINGER RELEASE    . TUMOR REMOVAL  2002   S1 tumor removal by Dr. Annette Stable  . VAGAL NERVE STIMULATOR REMOVAL     Past Medical History:  Diagnosis Date  . Arthritis   . Diabetes mellitus   . High blood pressure   . Plantar fasciitis    BP (!) 140/59   Pulse  61   Temp 98.6 F (37 C)   Ht 5\' 7"  (1.702 m)   Wt 169 lb (76.7 kg)   SpO2 98%   BMI 26.47 kg/m   Opioid Risk Score:   Fall Risk Score:  `1  Depression screen PHQ 2/9  Depression screen [facility] 2/9 01/29/2018 12/31/2017 11/28/2017 08/27/2017 04/30/2017 02/27/2017 01/30/2017  Decreased Interest 0 0 0 0 0 0 0  Down, Depressed, Hopeless 1 1 0 0 0 0 0  PHQ - 2 Score 1 1 0 0 0 0 0  Altered sleeping - - - - - - -  Tired, decreased energy - - - - - - -  Change in appetite - - - - - - -  Feeling bad or failure about yourself  - - - - - - -  Trouble concentrating - - - - - - -  Moving slowly or fidgety/restless - - - - - - -  Suicidal thoughts - - - - - - -  PHQ-9 Score - - - - - - -     Review of Systems  Constitutional: Negative.   HENT: Negative.   Eyes: Negative.   Respiratory: Positive for apnea.   Cardiovascular: Negative.   Gastrointestinal: Negative.   Endocrine: Negative.   Genitourinary: Negative.   Musculoskeletal: Positive for arthralgias  and myalgias.  Skin: Negative.   Allergic/Immunologic: Negative.   Neurological: Positive for numbness.  Hematological: Negative.   Psychiatric/Behavioral: Positive for dysphoric mood.  All other systems reviewed and are negative.      Objective:   Physical Exam Vitals signs and nursing note reviewed.  Constitutional:      Appearance: Normal appearance.  Neck:     Musculoskeletal: Normal range of motion and neck supple.  Cardiovascular:     Rate and Rhythm: Normal rate and regular rhythm.     Pulses: Normal pulses.     Heart sounds: Normal heart sounds.  Pulmonary:     Effort: Pulmonary effort is normal.     Breath sounds: Normal breath sounds.  Musculoskeletal:     Comments: Normal Muscle Bulk and Muscle Testing Reveals:  Upper Extremities: Full ROM and Muscle Strength 5/5  Lower Extremities: Full ROM and Muscle Strength 5/5 Arises from chair  with Ease Narrow Based Gait   Skin:    General: Skin is warm and dry.  Neurological:     Mental Status: He is alert and oriented to person, place, and time.  Psychiatric:        Mood and Affect: Mood normal.        Behavior: Behavior normal.           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  We will continue the opioid monitoring program, this consists of regular clinic visits, examinations, urine drug screen, pill counts as well as use of [place] Controlled Substance Reporting System. 2. Diabetic peripheral neuropathy affecting both feet: Continue current medication regimen with Gabapentin PCP Following. 07/22/2018  15 minutes of face to face patient care time was spent during this visit. All questions were encouraged and answered.  F/U in 1 month

## 2018-08-21 ENCOUNTER — Encounter: Payer: Medicare Other | Admitting: Registered Nurse

## 2018-08-26 ENCOUNTER — Other Ambulatory Visit: Payer: Self-pay

## 2018-08-26 ENCOUNTER — Encounter: Payer: Medicare Other | Attending: Physical Medicine & Rehabilitation | Admitting: Registered Nurse

## 2018-08-26 ENCOUNTER — Encounter: Payer: Self-pay | Admitting: Registered Nurse

## 2018-08-26 VITALS — BP 140/56 | HR 68 | Temp 97.7°F | Ht 67.0 in | Wt 166.0 lb

## 2018-08-26 DIAGNOSIS — Z5181 Encounter for therapeutic drug level monitoring: Secondary | ICD-10-CM | POA: Diagnosis not present

## 2018-08-26 DIAGNOSIS — D3610 Benign neoplasm of peripheral nerves and autonomic nervous system, unspecified: Secondary | ICD-10-CM | POA: Diagnosis not present

## 2018-08-26 DIAGNOSIS — Z79891 Long term (current) use of opiate analgesic: Secondary | ICD-10-CM

## 2018-08-26 DIAGNOSIS — M5418 Radiculopathy, sacral and sacrococcygeal region: Secondary | ICD-10-CM | POA: Diagnosis not present

## 2018-08-26 DIAGNOSIS — E1342 Other specified diabetes mellitus with diabetic polyneuropathy: Secondary | ICD-10-CM | POA: Insufficient documentation

## 2018-08-26 DIAGNOSIS — M79671 Pain in right foot: Secondary | ICD-10-CM | POA: Insufficient documentation

## 2018-08-26 DIAGNOSIS — Z760 Encounter for issue of repeat prescription: Secondary | ICD-10-CM | POA: Diagnosis not present

## 2018-08-26 DIAGNOSIS — E1149 Type 2 diabetes mellitus with other diabetic neurological complication: Secondary | ICD-10-CM

## 2018-08-26 DIAGNOSIS — Z86018 Personal history of other benign neoplasm: Secondary | ICD-10-CM

## 2018-08-26 DIAGNOSIS — E1140 Type 2 diabetes mellitus with diabetic neuropathy, unspecified: Secondary | ICD-10-CM | POA: Diagnosis not present

## 2018-08-26 DIAGNOSIS — G8929 Other chronic pain: Secondary | ICD-10-CM | POA: Diagnosis not present

## 2018-08-26 DIAGNOSIS — G894 Chronic pain syndrome: Secondary | ICD-10-CM

## 2018-08-26 MED ORDER — OXYCODONE HCL 15 MG PO TABS: 15.0000 mg | ORAL_TABLET | Freq: Three times a day (TID) | ORAL | 0 refills | Status: DC | PRN

## 2018-08-26 NOTE — Progress Notes (Signed)
Subjective:    Patient ID: Troy [name], male    DOB: [birth year], 73 y.o.   MRN: [id number]  HPI: Troy [name] is a 73 y.o. male who returns for follow up appointment for chronic pain and medication refill. He states his pain is located in his right foot. He  Rates his  Pain 9. His  current exercise regime is walking   Mr. Wisler Morphine equivalent is 67.50  MME.  Last UDS was Performed on 02/26/2018, it was consistent.   Pain Inventory Average Pain 9 Pain Right Now 9 My pain is aching  In the last 24 hours, has pain interfered with the following? General activity 9 Relation with others 8 Enjoyment of life 9 What TIME of day is your pain at its worst? daytime, evening, Sleep (in general) Good  Pain is worse with: bending Pain improves with: rest Relief from Meds: 8  Mobility ability to climb steps?  yes do you drive?  yes  Function retired  Neuro/Psych numbness depression  Prior Studies n/a  Physicians involved in your care n/a   Family History  Problem Relation Age of Onset  . Hypertension Mother   . Diabetes Mother   . Stroke Mother        multiple  . Hypertension Father   . Stroke Father   . Stroke Sister   . Stroke Daughter    Social History   Socioeconomic History  . Marital status: Married    Spouse name: Not on file  . Number of children: Not on file  . Years of education: Not on file  . Highest education level: Not on file  Occupational History  . Not on file  Social Needs  . Financial resource strain: Not on file  . Food insecurity    Worry: Not on file    Inability: Not on file  . Transportation needs    Medical: Not on file    Non-medical: Not on file  Tobacco Use  . Smoking status: Never Smoker  . Smokeless tobacco: Never Used  Substance and Sexual Activity  . Alcohol use: No    Alcohol/week: 0.0 standard drinks  . Drug use: No  . Sexual activity: Not on file  Lifestyle  . Physical activity    Days per week: Not on file     Minutes per session: Not on file  . Stress: Not on file  Relationships  . Social Herbalist on phone: Not on file    Gets together: Not on file    Attends religious service: Not on file    Active member of club or organization: Not on file    Attends meetings of clubs or organizations: Not on file    Relationship status: Not on file  Other Topics Concern  . Not on file  Social History Narrative  . Not on file   Past Surgical History:  Procedure Laterality Date  . CARDIAC SURGERY  2003   stent  . IMPLANTATION VAGAL NERVE STIMULATOR    . TRIGGER FINGER RELEASE    . TUMOR REMOVAL  2002   S1 tumor removal by Dr. Annette Stable  . VAGAL NERVE STIMULATOR REMOVAL     Past Medical History:  Diagnosis Date  . Arthritis   . Diabetes mellitus   . High blood pressure   . Plantar fasciitis    BP (!) 140/56   Pulse 68   Temp 97.7 F (36.5 C)   Ht 5\' 7"  (  1.702 m)   Wt 166 lb (75.3 kg)   SpO2 94%   BMI 26.00 kg/m   Opioid Risk Score:   Fall Risk Score:  `1  Depression screen PHQ 2/9  Depression screen [facility] 2/9 01/29/2018 12/31/2017 11/28/2017 08/27/2017 04/30/2017 02/27/2017 01/30/2017  Decreased Interest 0 0 0 0 0 0 0  Down, Depressed, Hopeless 1 1 0 0 0 0 0  PHQ - 2 Score 1 1 0 0 0 0 0  Altered sleeping - - - - - - -  Tired, decreased energy - - - - - - -  Change in appetite - - - - - - -  Feeling bad or failure about yourself  - - - - - - -  Trouble concentrating - - - - - - -  Moving slowly or fidgety/restless - - - - - - -  Suicidal thoughts - - - - - - -  PHQ-9 Score - - - - - - -     Review of Systems  Constitutional:       High blood sugar  Respiratory: Positive for apnea.   All other systems reviewed and are negative.      Objective:   Physical Exam Vitals signs and nursing note reviewed.  Constitutional:      Appearance: Normal appearance.  Neck:     Musculoskeletal: Normal range of motion and neck supple.  Cardiovascular:     Rate and Rhythm:  Normal rate and regular rhythm.     Pulses: Normal pulses.     Heart sounds: Normal heart sounds.  Pulmonary:     Effort: Pulmonary effort is normal.     Breath sounds: Normal breath sounds.  Musculoskeletal:     Comments: Normal Muscle Bulk and Muscle Testing Reveals:  Upper Extremities: Full ROM and Muscle Strength 5/5  Lower Extremities: Full ROM and Muscle Strength 5/5 Arises from Chair with easy  Narrow Based Gait   Skin:    General: Skin is warm and dry.  Neurological:     Mental Status: He is alert and oriented to person, place, and time.  Psychiatric:        Mood and Affect: Mood normal.        Behavior: Behavior normal.           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  We will continue the opioid monitoring program, this consists of regular clinic visits, examinations, urine drug screen, pill counts as well as use of [place] Controlled Substance Reporting System. 2. Diabetic peripheral neuropathy affecting both feet: Continue current medication regimen with Gabapentin PCP Following. 08/26/2018  15 minutes of face to face patient care time was spent during this visit. All questions were encouraged and answered.  F/U in 1 month

## 2018-09-26 ENCOUNTER — Encounter: Payer: Medicare Other | Admitting: Registered Nurse

## 2018-10-01 ENCOUNTER — Other Ambulatory Visit: Payer: Self-pay

## 2018-10-01 ENCOUNTER — Encounter: Payer: Medicare Other | Attending: Physical Medicine & Rehabilitation | Admitting: Registered Nurse

## 2018-10-01 ENCOUNTER — Encounter: Payer: Self-pay | Admitting: Registered Nurse

## 2018-10-01 VITALS — BP 133/69 | HR 61 | Temp 97.7°F | Resp 16 | Ht 63.0 in | Wt 162.8 lb

## 2018-10-01 DIAGNOSIS — G8929 Other chronic pain: Secondary | ICD-10-CM | POA: Diagnosis not present

## 2018-10-01 DIAGNOSIS — Z5181 Encounter for therapeutic drug level monitoring: Secondary | ICD-10-CM

## 2018-10-01 DIAGNOSIS — D3610 Benign neoplasm of peripheral nerves and autonomic nervous system, unspecified: Secondary | ICD-10-CM | POA: Diagnosis not present

## 2018-10-01 DIAGNOSIS — Z760 Encounter for issue of repeat prescription: Secondary | ICD-10-CM | POA: Insufficient documentation

## 2018-10-01 DIAGNOSIS — E1149 Type 2 diabetes mellitus with other diabetic neurological complication: Secondary | ICD-10-CM

## 2018-10-01 DIAGNOSIS — Z86018 Personal history of other benign neoplasm: Secondary | ICD-10-CM

## 2018-10-01 DIAGNOSIS — M5418 Radiculopathy, sacral and sacrococcygeal region: Secondary | ICD-10-CM | POA: Diagnosis not present

## 2018-10-01 DIAGNOSIS — E1140 Type 2 diabetes mellitus with diabetic neuropathy, unspecified: Secondary | ICD-10-CM | POA: Diagnosis not present

## 2018-10-01 DIAGNOSIS — M79671 Pain in right foot: Secondary | ICD-10-CM | POA: Diagnosis present

## 2018-10-01 DIAGNOSIS — Z79891 Long term (current) use of opiate analgesic: Secondary | ICD-10-CM

## 2018-10-01 DIAGNOSIS — E1342 Other specified diabetes mellitus with diabetic polyneuropathy: Secondary | ICD-10-CM | POA: Insufficient documentation

## 2018-10-01 DIAGNOSIS — G894 Chronic pain syndrome: Secondary | ICD-10-CM

## 2018-10-01 MED ORDER — OXYCODONE HCL 15 MG PO TABS: 15.0000 mg | ORAL_TABLET | Freq: Three times a day (TID) | ORAL | 0 refills | Status: DC | PRN

## 2018-10-01 NOTE — Progress Notes (Signed)
Subjective:    Patient ID: Troy [name], male    DOB: July 22, 1945, 73 y.o.   MRN: [id number]  HPI: Troy [name] is a 73 y.o. male who returns for follow up appointment for chronic pain and medication refill. He states his pain is located in his right foot with numbness. He rates his pain 8. His current exercise regime is walking and performing outside chores.   Troy [name] Morphine equivalent is 67.50 MME.  Last UDS was Performed on 02/26/2018, it was consistent.    Pain Inventory Average Pain 8 Pain Right Now 8 My pain is aching  In the last 24 hours, has pain interfered with the following? General activity 8 Relation with others 7 Enjoyment of life 8 What TIME of day is your pain at its worst? daytime Sleep (in general) Good  Pain is worse with: bending Pain improves with: rest and medication Relief from Meds: 8  Mobility walk with assistance how many minutes can you walk? 15 ability to climb steps?  yes do you drive?  yes  Function retired  Neuro/Psych numbness depression  Prior Studies Any changes since last visit?  no  Physicians involved in your care Any changes since last visit?  no   Family History  Problem Relation Age of Onset  . Hypertension Mother   . Diabetes Mother   . Stroke Mother        multiple  . Hypertension Father   . Stroke Father   . Stroke Sister   . Stroke Daughter    Social History   Socioeconomic History  . Marital status: Married    Spouse name: Not on file  . Number of children: Not on file  . Years of education: Not on file  . Highest education level: Not on file  Occupational History  . Not on file  Social Needs  . Financial resource strain: Not on file  . Food insecurity    Worry: Not on file    Inability: Not on file  . Transportation needs    Medical: Not on file    Non-medical: Not on file  Tobacco Use  . Smoking status: Never Smoker  . Smokeless tobacco: Never Used  Substance and Sexual Activity  .  Alcohol use: No    Alcohol/week: 0.0 standard drinks  . Drug use: No  . Sexual activity: Not on file  Lifestyle  . Physical activity    Days per week: Not on file    Minutes per session: Not on file  . Stress: Not on file  Relationships  . Social Herbalist on phone: Not on file    Gets together: Not on file    Attends religious service: Not on file    Active member of club or organization: Not on file    Attends meetings of clubs or organizations: Not on file    Relationship status: Not on file  Other Topics Concern  . Not on file  Social History Narrative  . Not on file   Past Surgical History:  Procedure Laterality Date  . CARDIAC SURGERY  2003   stent  . IMPLANTATION VAGAL NERVE STIMULATOR    . TRIGGER FINGER RELEASE    . TUMOR REMOVAL  2002   S1 tumor removal by Dr. Annette Stable  . VAGAL NERVE STIMULATOR REMOVAL     Past Medical History:  Diagnosis Date  . Arthritis   . Diabetes mellitus   . High blood pressure   .  Plantar fasciitis    There were no vitals taken for this visit.  Opioid Risk Score:   Fall Risk Score:  `1  Depression screen PHQ 2/9  Depression screen [facility] 2/9 01/29/2018 12/31/2017 11/28/2017 08/27/2017 04/30/2017 02/27/2017 01/30/2017  Decreased Interest 0 0 0 0 0 0 0  Down, Depressed, Hopeless 1 1 0 0 0 0 0  PHQ - 2 Score 1 1 0 0 0 0 0  Altered sleeping - - - - - - -  Tired, decreased energy - - - - - - -  Change in appetite - - - - - - -  Feeling bad or failure about yourself  - - - - - - -  Trouble concentrating - - - - - - -  Moving slowly or fidgety/restless - - - - - - -  Suicidal thoughts - - - - - - -  PHQ-9 Score - - - - - - -     Review of Systems  Constitutional: Negative.   HENT: Negative.   Respiratory: Positive for apnea.   Cardiovascular: Negative.   Gastrointestinal: Negative.   Endocrine:       High blood sugar  Genitourinary: Negative.   Musculoskeletal: Positive for gait problem and myalgias.  Skin: Negative.    Allergic/Immunologic: Negative.   Neurological: Positive for weakness.  Psychiatric/Behavioral: The patient is nervous/anxious.        Objective:   Physical Exam Vitals signs and nursing note reviewed.  Constitutional:      Appearance: Normal appearance.  Neck:     Musculoskeletal: Normal range of motion and neck supple.  Cardiovascular:     Rate and Rhythm: Normal rate and regular rhythm.     Pulses: Normal pulses.     Heart sounds: Normal heart sounds.  Pulmonary:     Effort: Pulmonary effort is normal.     Breath sounds: Normal breath sounds.  Musculoskeletal:     Comments: Normal Muscle Bulk and Muscle Testing Reveals: Upper Extremities: Full ROM and Muscle Strength 5/5  Lower Extremities: Full ROM and Muscle Strength 5/5 Arises from chair with ease Narrow Based Gait   Skin:    General: Skin is warm and dry.  Neurological:     Mental Status: He is alert and oriented to person, place, and time.  Psychiatric:        Mood and Affect: Mood normal.        Behavior: Behavior normal.           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  We will continue the opioid monitoring program, this consists of regular clinic visits, examinations, urine drug screen, pill counts as well as use of [place] Controlled Substance Reporting System. 2. Diabetic peripheral neuropathy affecting both feet: Continue current medication regimen with Gabapentin PCP Following. 10/01/2018  18minutes of face to face patient care time was spent during this visit. All questions were encouraged and answered.  F/U in 1 month

## 2018-10-24 ENCOUNTER — Other Ambulatory Visit: Payer: Self-pay

## 2018-10-24 ENCOUNTER — Encounter: Payer: Self-pay | Admitting: Registered Nurse

## 2018-10-24 ENCOUNTER — Encounter: Payer: Medicare Other | Attending: Physical Medicine & Rehabilitation | Admitting: Registered Nurse

## 2018-10-24 VITALS — BP 122/63 | HR 66 | Temp 98.5°F | Ht 63.0 in | Wt 163.0 lb

## 2018-10-24 DIAGNOSIS — Z5181 Encounter for therapeutic drug level monitoring: Secondary | ICD-10-CM

## 2018-10-24 DIAGNOSIS — Z760 Encounter for issue of repeat prescription: Secondary | ICD-10-CM | POA: Diagnosis not present

## 2018-10-24 DIAGNOSIS — D3610 Benign neoplasm of peripheral nerves and autonomic nervous system, unspecified: Secondary | ICD-10-CM | POA: Insufficient documentation

## 2018-10-24 DIAGNOSIS — Z79891 Long term (current) use of opiate analgesic: Secondary | ICD-10-CM

## 2018-10-24 DIAGNOSIS — Z86018 Personal history of other benign neoplasm: Secondary | ICD-10-CM | POA: Diagnosis not present

## 2018-10-24 DIAGNOSIS — M5418 Radiculopathy, sacral and sacrococcygeal region: Secondary | ICD-10-CM | POA: Diagnosis not present

## 2018-10-24 DIAGNOSIS — G894 Chronic pain syndrome: Secondary | ICD-10-CM

## 2018-10-24 DIAGNOSIS — E1149 Type 2 diabetes mellitus with other diabetic neurological complication: Secondary | ICD-10-CM

## 2018-10-24 DIAGNOSIS — E1342 Other specified diabetes mellitus with diabetic polyneuropathy: Secondary | ICD-10-CM | POA: Insufficient documentation

## 2018-10-24 DIAGNOSIS — G8929 Other chronic pain: Secondary | ICD-10-CM | POA: Insufficient documentation

## 2018-10-24 DIAGNOSIS — E1140 Type 2 diabetes mellitus with diabetic neuropathy, unspecified: Secondary | ICD-10-CM

## 2018-10-24 DIAGNOSIS — M79671 Pain in right foot: Secondary | ICD-10-CM | POA: Diagnosis present

## 2018-10-24 MED ORDER — OXYCODONE HCL 15 MG PO TABS: 15.0000 mg | ORAL_TABLET | Freq: Three times a day (TID) | ORAL | 0 refills | Status: DC | PRN

## 2018-10-24 NOTE — Progress Notes (Signed)
Subjective:    Patient ID: Troy [name], male    DOB: 07/12/1945, 73 y.o.   MRN: [id number]  HPI: Troy [name] is a 73 y.o. male who returns for follow up appointment for chronic pain and medication refill. He states his pain is located in his right foot. She rates her pain 8. Her current exercise regime is walking and performing stretching exercises.  Mr. Billett reports on Saturday he was in his utility room and when he turned around he landed on on his left side, he has a skin abrasion on his left shin. Educated on falls prevention, he verbalizes understanding.   Mr. Moro Morphine equivalent is 67.50  MME.  UDS ordered today.   Pain Inventory Average Pain 8 Pain Right Now 8 My pain is aching  In the last 24 hours, has pain interfered with the following? General activity 8 Relation with others 8 Enjoyment of life 8 What TIME of day is your pain at its worst? morning,daytime and evening  Sleep (in general) Good  Pain is worse with: walking and bending Pain improves with: rest and medication Relief from Meds: 8  Mobility how many minutes can you walk? 15 ability to climb steps?  yes do you drive?  yes Do you have any goals in this area?  yes  Function retired Do you have any goals in this area?  yes  Neuro/Psych numbness depression  Prior Studies Any changes since last visit?  no  Physicians involved in your care Any changes since last visit?  no   Family History  Problem Relation Age of Onset  . Hypertension Mother   . Diabetes Mother   . Stroke Mother        multiple  . Hypertension Father   . Stroke Father   . Stroke Sister   . Stroke Daughter    Social History   Socioeconomic History  . Marital status: Married    Spouse name: Not on file  . Number of children: Not on file  . Years of education: Not on file  . Highest education level: Not on file  Occupational History  . Not on file  Social Needs  . Financial resource strain: Not on file  .  Food insecurity    Worry: Not on file    Inability: Not on file  . Transportation needs    Medical: Not on file    Non-medical: Not on file  Tobacco Use  . Smoking status: Never Smoker  . Smokeless tobacco: Never Used  Substance and Sexual Activity  . Alcohol use: No    Alcohol/week: 0.0 standard drinks  . Drug use: No  . Sexual activity: Not on file  Lifestyle  . Physical activity    Days per week: Not on file    Minutes per session: Not on file  . Stress: Not on file  Relationships  . Social Herbalist on phone: Not on file    Gets together: Not on file    Attends religious service: Not on file    Active member of club or organization: Not on file    Attends meetings of clubs or organizations: Not on file    Relationship status: Not on file  Other Topics Concern  . Not on file  Social History Narrative  . Not on file   Past Surgical History:  Procedure Laterality Date  . CARDIAC SURGERY  2003   stent  . IMPLANTATION VAGAL NERVE STIMULATOR    .  TRIGGER FINGER RELEASE    . TUMOR REMOVAL  2002   S1 tumor removal by Dr. Annette Stable  . VAGAL NERVE STIMULATOR REMOVAL     Past Medical History:  Diagnosis Date  . Arthritis   . Diabetes mellitus   . High blood pressure   . Plantar fasciitis    BP 122/63   Pulse 66   Temp 98.5 F (36.9 C)   Ht 5\' 3"  (1.6 m)   Wt 163 lb (73.9 kg)   SpO2 98%   BMI 28.87 kg/m   Opioid Risk Score:   Fall Risk Score:  `1  Depression screen PHQ 2/9  Depression screen [facility] 2/9 01/29/2018 12/31/2017 11/28/2017 08/27/2017 04/30/2017 02/27/2017 01/30/2017  Decreased Interest 0 0 0 0 0 0 0  Down, Depressed, Hopeless 1 1 0 0 0 0 0  PHQ - 2 Score 1 1 0 0 0 0 0  Altered sleeping - - - - - - -  Tired, decreased energy - - - - - - -  Change in appetite - - - - - - -  Feeling bad or failure about yourself  - - - - - - -  Trouble concentrating - - - - - - -  Moving slowly or fidgety/restless - - - - - - -  Suicidal thoughts - - - - - - -   PHQ-9 Score - - - - - - -    Review of Systems  Constitutional: Negative.   HENT: Negative.   Eyes: Negative.   Respiratory: Negative.   Cardiovascular: Negative.   Gastrointestinal: Negative.   Endocrine: Negative.   Genitourinary: Negative.   Musculoskeletal: Negative.   Skin: Negative.   Allergic/Immunologic: Negative.   Neurological: Positive for numbness.  Hematological: Negative.   Psychiatric/Behavioral: Positive for dysphoric mood.  All other systems reviewed and are negative.      Objective:   Physical Exam Vitals signs and nursing note reviewed.  Constitutional:      Appearance: Normal appearance.  Neck:     Musculoskeletal: Normal range of motion and neck supple.  Cardiovascular:     Rate and Rhythm: Normal rate and regular rhythm.     Pulses: Normal pulses.     Heart sounds: Normal heart sounds.  Pulmonary:     Effort: Pulmonary effort is normal.     Breath sounds: Normal breath sounds.  Musculoskeletal:     Comments: Normal Muscle Bulk and Muscle Testing Reveals:  Upper Extremities: Full ROM and Muscle Strength 5/5 Lower Extremities: Full ROM and Muscle Strength 5/5 Arises from chair with ease Narrow Based Gait   Skin:    General: Skin is warm and dry.  Neurological:     Mental Status: He is alert and oriented to person, place, and time.  Psychiatric:        Mood and Affect: Mood normal.        Behavior: Behavior normal.           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  We will continue the opioid monitoring program, this consists of regular clinic visits, examinations, urine drug screen, pill counts as well as use of [place] Controlled Substance Reporting System. 2. Diabetic peripheral neuropathy affecting both feet: Continue current medication regimen with Gabapentin PCP Following.  10/24/2018  28minutes of face to face patient care time was spent during this  visit. All questions were encouraged and answered.  F/U in 1 month

## 2018-10-29 LAB — TOXASSURE SELECT,+ANTIDEPR,UR

## 2018-10-30 ENCOUNTER — Telehealth: Payer: Self-pay | Admitting: *Deleted

## 2018-10-30 NOTE — Telephone Encounter (Signed)
Urine drug screen for this encounter is consistent for prescribed medication 

## 2018-11-26 ENCOUNTER — Telehealth: Payer: Self-pay | Admitting: *Deleted

## 2018-11-26 NOTE — Telephone Encounter (Signed)
Patients wife states he will run ou of pain meds before upcoming appt with Zella Ball.

## 2018-11-26 NOTE — Telephone Encounter (Signed)
Placed a call to Mrs. Ricard Dillon, Mr. Hendren appointment changed to 11/28/2018, she verbalizes understanding.

## 2018-11-28 ENCOUNTER — Encounter: Payer: Medicare Other | Attending: Physical Medicine & Rehabilitation | Admitting: Registered Nurse

## 2018-11-28 ENCOUNTER — Other Ambulatory Visit: Payer: Self-pay

## 2018-11-28 ENCOUNTER — Encounter: Payer: Self-pay | Admitting: Registered Nurse

## 2018-11-28 VITALS — BP 124/65 | HR 68 | Temp 97.5°F | Ht 67.0 in | Wt 164.0 lb

## 2018-11-28 DIAGNOSIS — G8929 Other chronic pain: Secondary | ICD-10-CM | POA: Insufficient documentation

## 2018-11-28 DIAGNOSIS — M5418 Radiculopathy, sacral and sacrococcygeal region: Secondary | ICD-10-CM | POA: Insufficient documentation

## 2018-11-28 DIAGNOSIS — E1342 Other specified diabetes mellitus with diabetic polyneuropathy: Secondary | ICD-10-CM | POA: Insufficient documentation

## 2018-11-28 DIAGNOSIS — E1140 Type 2 diabetes mellitus with diabetic neuropathy, unspecified: Secondary | ICD-10-CM | POA: Diagnosis not present

## 2018-11-28 DIAGNOSIS — Z86018 Personal history of other benign neoplasm: Secondary | ICD-10-CM

## 2018-11-28 DIAGNOSIS — Z79891 Long term (current) use of opiate analgesic: Secondary | ICD-10-CM

## 2018-11-28 DIAGNOSIS — M545 Low back pain, unspecified: Secondary | ICD-10-CM

## 2018-11-28 DIAGNOSIS — M79671 Pain in right foot: Secondary | ICD-10-CM | POA: Diagnosis present

## 2018-11-28 DIAGNOSIS — Z5181 Encounter for therapeutic drug level monitoring: Secondary | ICD-10-CM | POA: Diagnosis not present

## 2018-11-28 DIAGNOSIS — D3610 Benign neoplasm of peripheral nerves and autonomic nervous system, unspecified: Secondary | ICD-10-CM | POA: Insufficient documentation

## 2018-11-28 DIAGNOSIS — E1149 Type 2 diabetes mellitus with other diabetic neurological complication: Secondary | ICD-10-CM

## 2018-11-28 DIAGNOSIS — Z760 Encounter for issue of repeat prescription: Secondary | ICD-10-CM | POA: Insufficient documentation

## 2018-11-28 DIAGNOSIS — G894 Chronic pain syndrome: Secondary | ICD-10-CM

## 2018-11-28 MED ORDER — OXYCODONE HCL 15 MG PO TABS: 15.0000 mg | ORAL_TABLET | Freq: Three times a day (TID) | ORAL | 0 refills | Status: DC | PRN

## 2018-11-28 NOTE — Progress Notes (Signed)
Subjective:    Patient ID: Troy [name], male    DOB: 1945/05/30, 73 y.o.   MRN: [id number]  HPI: Troy [name] is a 73 y.o. male who returns for follow up appointment for chronic pain and medication refill. He states his pain is located in his lower back and right foot with numbness. He rates his  Pain 8. His  current exercise regime is walking, outside chore mowing with a push mower and performing stretching exercises.  Troy [name] Morphine equivalent is 67.50 MME.  Last UDS was Performed on 10/24/2018, it was consistent.   Pain Inventory Average Pain 8 Pain Right Now 8 My pain is aching  In the last 24 hours, has pain interfered with the following? General activity 8 Relation with others 8 Enjoyment of life 7 What TIME of day is your pain at its worst? daytime Sleep (in general) Good  Pain is worse with: bending and some activites Pain improves with: rest and medication Relief from Meds: .  Mobility ability to climb steps?  yes do you drive?  yes  Function retired  Neuro/Psych numbness depression  Prior Studies Any changes since last visit?  no  Physicians involved in your care Any changes since last visit?  no   Family History  Problem Relation Age of Onset  . Hypertension Mother   . Diabetes Mother   . Stroke Mother        multiple  . Hypertension Father   . Stroke Father   . Stroke Sister   . Stroke Daughter    Social History   Socioeconomic History  . Marital status: Married    Spouse name: Not on file  . Number of children: Not on file  . Years of education: Not on file  . Highest education level: Not on file  Occupational History  . Not on file  Social Needs  . Financial resource strain: Not on file  . Food insecurity    Worry: Not on file    Inability: Not on file  . Transportation needs    Medical: Not on file    Non-medical: Not on file  Tobacco Use  . Smoking status: Never Smoker  . Smokeless tobacco: Never Used  Substance and  Sexual Activity  . Alcohol use: No    Alcohol/week: 0.0 standard drinks  . Drug use: No  . Sexual activity: Not on file  Lifestyle  . Physical activity    Days per week: Not on file    Minutes per session: Not on file  . Stress: Not on file  Relationships  . Social Herbalist on phone: Not on file    Gets together: Not on file    Attends religious service: Not on file    Active member of club or organization: Not on file    Attends meetings of clubs or organizations: Not on file    Relationship status: Not on file  Other Topics Concern  . Not on file  Social History Narrative  . Not on file   Past Surgical History:  Procedure Laterality Date  . CARDIAC SURGERY  2003   stent  . IMPLANTATION VAGAL NERVE STIMULATOR    . TRIGGER FINGER RELEASE    . TUMOR REMOVAL  2002   S1 tumor removal by Dr. Annette Stable  . VAGAL NERVE STIMULATOR REMOVAL     Past Medical History:  Diagnosis Date  . Arthritis   . Diabetes mellitus   . High  blood pressure   . Plantar fasciitis    BP 124/65   Pulse 68   Temp (!) 97.5 F (36.4 C)   Ht 5\' 7"  (1.702 m)   Wt 164 lb (74.4 kg)   SpO2 97%   BMI 25.69 kg/m   Opioid Risk Score:   Fall Risk Score:  `1  Depression screen PHQ 2/9  Depression screen [facility] 2/9 01/29/2018 12/31/2017 11/28/2017 08/27/2017 04/30/2017 02/27/2017 01/30/2017  Decreased Interest 0 0 0 0 0 0 0  Down, Depressed, Hopeless 1 1 0 0 0 0 0  PHQ - 2 Score 1 1 0 0 0 0 0  Altered sleeping - - - - - - -  Tired, decreased energy - - - - - - -  Change in appetite - - - - - - -  Feeling bad or failure about yourself  - - - - - - -  Trouble concentrating - - - - - - -  Moving slowly or fidgety/restless - - - - - - -  Suicidal thoughts - - - - - - -  PHQ-9 Score - - - - - - -     Review of Systems  Constitutional: Negative.   HENT: Negative.   Eyes: Negative.   Respiratory: Positive for apnea.   Cardiovascular: Negative.   Gastrointestinal: Negative.   Endocrine:  Negative.   Genitourinary: Positive for difficulty urinating.  Musculoskeletal: Positive for arthralgias, gait problem and myalgias.  Skin: Negative.   Allergic/Immunologic: Negative.   Neurological: Positive for numbness.  Hematological: Negative.   Psychiatric/Behavioral: Positive for dysphoric mood.  All other systems reviewed and are negative.      Objective:   Physical Exam Vitals signs and nursing note reviewed.  Constitutional:      Appearance: Normal appearance.  Neck:     Musculoskeletal: Normal range of motion and neck supple.  Cardiovascular:     Rate and Rhythm: Normal rate and regular rhythm.     Pulses: Normal pulses.     Heart sounds: Normal heart sounds.  Pulmonary:     Effort: Pulmonary effort is normal.     Breath sounds: Normal breath sounds.  Musculoskeletal:     Comments: Normal Muscle Bulk and Muscle Testing Reveals:  Upper Extremities: Full ROM and Muscle Strength 5/5  Lumbar Paraspinal Tenderness: L-4-L-5 Lower Extremities: Full ROM and Muscle Strength 5/5 Right Lower Extremity Flexion Produces Pain into Right Foot Arises from chair with ease Narrow Based Gait   Skin:    General: Skin is warm and dry.  Neurological:     Mental Status: He is alert and oriented to person, place, and time.  Psychiatric:        Mood and Affect: Mood normal.        Behavior: Behavior normal.           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  We will continue the opioid monitoring program, this consists of regular clinic visits, examinations, urine drug screen, pill counts as well as use of [place] Controlled Substance Reporting System. 2. Diabetic peripheral neuropathy affecting both feet: Continue current medication regimen with Gabapentin PCP Following. 11/28/2018 3. Chronic Low Back Pain: Continue  current medication regimen. Continue HEP as Tolerated. Continue to Monitor.   16minutes of face to face patient care time was spent during this visit. All questions were encouraged  and answered.  F/U in 1 month

## 2018-12-05 ENCOUNTER — Encounter: Payer: Medicare Other | Admitting: Registered Nurse

## 2018-12-06 ENCOUNTER — Other Ambulatory Visit: Payer: Self-pay

## 2018-12-06 MED ORDER — GABAPENTIN 400 MG PO CAPS: 800.0000 mg | ORAL_CAPSULE | Freq: Four times a day (QID) | ORAL | 0 refills | Status: DC

## 2018-12-06 NOTE — Telephone Encounter (Signed)
Placed a call to [facility], spoke with

## 2018-12-06 NOTE — Telephone Encounter (Addendum)
Patients wife called and requested a refill of gabapentin medication.  According to last note this medication is managed at this time by the patients primary care provider and the information we have in his chart is from his last prescription of it from [place] in 2014  Called patients wife for clarification. Was informed that the patients primary care office was unable to refill it for unknown reasons and is closed today and the rest of the weekend.  States is currently completely out of the medication and is asking if we can just fill enough for a week until their primary care provider can refill it again.  Current dosage is Gabapentin 400mg   2 Capsules Qid Daily

## 2018-12-06 NOTE — Telephone Encounter (Signed)
Placed a call to [facility] and spoke with pharmacist, they received a denial on [name] Gabapentin. Since his PCP office is closed, will e-scibe  gabapentin prescription for a week supply. Also spoke with Mrs. Troy [name] regarding the above, she verbalizes understanding. Reports [name] has a PCP appointment scheduled next month.

## 2018-12-25 ENCOUNTER — Encounter: Payer: Self-pay | Admitting: Registered Nurse

## 2018-12-25 ENCOUNTER — Encounter: Payer: Medicare Other | Attending: Physical Medicine & Rehabilitation | Admitting: Registered Nurse

## 2018-12-25 ENCOUNTER — Other Ambulatory Visit: Payer: Self-pay

## 2018-12-25 VITALS — BP 121/60 | Temp 97.7°F | Resp 62 | Ht 67.0 in | Wt 166.0 lb

## 2018-12-25 DIAGNOSIS — E1140 Type 2 diabetes mellitus with diabetic neuropathy, unspecified: Secondary | ICD-10-CM | POA: Diagnosis not present

## 2018-12-25 DIAGNOSIS — G8929 Other chronic pain: Secondary | ICD-10-CM | POA: Diagnosis not present

## 2018-12-25 DIAGNOSIS — E1342 Other specified diabetes mellitus with diabetic polyneuropathy: Secondary | ICD-10-CM | POA: Diagnosis not present

## 2018-12-25 DIAGNOSIS — D3610 Benign neoplasm of peripheral nerves and autonomic nervous system, unspecified: Secondary | ICD-10-CM | POA: Insufficient documentation

## 2018-12-25 DIAGNOSIS — G894 Chronic pain syndrome: Secondary | ICD-10-CM | POA: Diagnosis not present

## 2018-12-25 DIAGNOSIS — M5418 Radiculopathy, sacral and sacrococcygeal region: Secondary | ICD-10-CM | POA: Diagnosis not present

## 2018-12-25 DIAGNOSIS — M79671 Pain in right foot: Secondary | ICD-10-CM | POA: Insufficient documentation

## 2018-12-25 DIAGNOSIS — Z760 Encounter for issue of repeat prescription: Secondary | ICD-10-CM | POA: Diagnosis not present

## 2018-12-25 DIAGNOSIS — M545 Low back pain, unspecified: Secondary | ICD-10-CM

## 2018-12-25 DIAGNOSIS — Z5181 Encounter for therapeutic drug level monitoring: Secondary | ICD-10-CM | POA: Diagnosis not present

## 2018-12-25 DIAGNOSIS — E1149 Type 2 diabetes mellitus with other diabetic neurological complication: Secondary | ICD-10-CM

## 2018-12-25 DIAGNOSIS — Z86018 Personal history of other benign neoplasm: Secondary | ICD-10-CM

## 2018-12-25 DIAGNOSIS — Z79891 Long term (current) use of opiate analgesic: Secondary | ICD-10-CM

## 2018-12-25 MED ORDER — OXYCODONE HCL 15 MG PO TABS: 15.0000 mg | ORAL_TABLET | Freq: Three times a day (TID) | ORAL | 0 refills | Status: DC | PRN

## 2018-12-25 NOTE — Progress Notes (Signed)
Subjective:    Patient ID: Troy [name], male    DOB: 29-Jun-1945, 73 y.o.   MRN: [id number]  HPI: Troy [name] is a 73 y.o. male who returns for follow up appointment for chronic pain and medication refill. He states his pain is located in his lower back with activity and right foot pain. He rates  His pain 8. His current exercise regime is walking and performing stretching exercises.   Troy [name] Morphine equivalent is 67.50  MME.  Last UDS was Performed on 10/24/2018, it was consistent.    Pain Inventory Average Pain 8 Pain Right Now 8 My pain is aching  In the last 24 hours, has pain interfered with the following? General activity 8 Relation with others 8 Enjoyment of life 9 What TIME of day is your pain at its worst? morning, evening Sleep (in general) Good  Pain is worse with: bending Pain improves with: medication Relief from Meds: 8  Mobility walk without assistance how many minutes can you walk? 15 ability to climb steps?  yes do you drive?  yes Do you have any goals in this area?  yes  Function retired Do you have any goals in this area?  yes  Neuro/Psych numbness depression  Prior Studies Any changes since last visit?  no  Physicians involved in your care Any changes since last visit?  no   Family History  Problem Relation Age of Onset  . Hypertension Mother   . Diabetes Mother   . Stroke Mother        multiple  . Hypertension Father   . Stroke Father   . Stroke Sister   . Stroke Daughter    Social History   Socioeconomic History  . Marital status: Married    Spouse name: Not on file  . Number of children: Not on file  . Years of education: Not on file  . Highest education level: Not on file  Occupational History  . Not on file  Social Needs  . Financial resource strain: Not on file  . Food insecurity    Worry: Not on file    Inability: Not on file  . Transportation needs    Medical: Not on file    Non-medical: Not on file   Tobacco Use  . Smoking status: Never Smoker  . Smokeless tobacco: Never Used  Substance and Sexual Activity  . Alcohol use: No    Alcohol/week: 0.0 standard drinks  . Drug use: No  . Sexual activity: Not on file  Lifestyle  . Physical activity    Days per week: Not on file    Minutes per session: Not on file  . Stress: Not on file  Relationships  . Social Herbalist on phone: Not on file    Gets together: Not on file    Attends religious service: Not on file    Active member of club or organization: Not on file    Attends meetings of clubs or organizations: Not on file    Relationship status: Not on file  Other Topics Concern  . Not on file  Social History Narrative  . Not on file   Past Surgical History:  Procedure Laterality Date  . CARDIAC SURGERY  2003   stent  . IMPLANTATION VAGAL NERVE STIMULATOR    . TRIGGER FINGER RELEASE    . TUMOR REMOVAL  2002   S1 tumor removal by Dr. Annette Stable  . VAGAL NERVE STIMULATOR REMOVAL  Past Medical History:  Diagnosis Date  . Arthritis   . Diabetes mellitus   . High blood pressure   . Plantar fasciitis    BP 121/60   Temp 97.7 F (36.5 C)   Resp (!) 62   Ht 5\' 7"  (1.702 m)   Wt 166 lb (75.3 kg)   SpO2 96%   BMI 26.00 kg/m   Opioid Risk Score:   Fall Risk Score:  `1  Depression screen PHQ 2/9  Depression screen [facility] 2/9 01/29/2018 12/31/2017 11/28/2017 08/27/2017 04/30/2017 02/27/2017 01/30/2017  Decreased Interest 0 0 0 0 0 0 0  Down, Depressed, Hopeless 1 1 0 0 0 0 0  PHQ - 2 Score 1 1 0 0 0 0 0  Altered sleeping - - - - - - -  Tired, decreased energy - - - - - - -  Change in appetite - - - - - - -  Feeling bad or failure about yourself  - - - - - - -  Trouble concentrating - - - - - - -  Moving slowly or fidgety/restless - - - - - - -  Suicidal thoughts - - - - - - -  PHQ-9 Score - - - - - - -    Review of Systems  Constitutional: Negative.   HENT: Negative.   Eyes: Negative.   Respiratory: Positive  for apnea.   Cardiovascular: Negative.   Gastrointestinal: Negative.   Endocrine:       High blood sugar  Genitourinary: Positive for difficulty urinating.  Musculoskeletal:       Feet pain  Allergic/Immunologic: Negative.   Neurological: Positive for numbness.  Hematological: Negative.   Psychiatric/Behavioral: Positive for dysphoric mood.  All other systems reviewed and are negative.      Objective:   Physical Exam Vitals signs and nursing note reviewed.  Constitutional:      Appearance: Normal appearance.  Neck:     Musculoskeletal: Normal range of motion and neck supple.  Cardiovascular:     Rate and Rhythm: Normal rate and regular rhythm.     Pulses: Normal pulses.     Heart sounds: Normal heart sounds.  Pulmonary:     Effort: Pulmonary effort is normal.     Breath sounds: Normal breath sounds.  Musculoskeletal:     Comments: Normal Muscle Bulk and Muscle Testing Reveals:  Upper Extremities: Full ROM and Muscle Strength 5/5 Lower Extremities: Full ROM and Muscle Strength 5/5 Right Lower Extremity Flexion Produces Pain into Right Foot Arises from chair with ease Narrow Based  Gait   Skin:    General: Skin is warm and dry.  Neurological:     Mental Status: He is oriented to person, place, and time.  Psychiatric:        Mood and Affect: Mood normal.        Behavior: Behavior normal.           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  We will continue the opioid monitoring program, this consists of regular clinic visits, examinations, urine drug screen, pill counts as well as use of [place] Controlled Substance Reporting System. 2. Diabetic peripheral neuropathy affecting both feet: Continue current medication regimen with Gabapentin PCP Following. 12/25/2018 3. Chronic Low Back Pain: Continue current  medication regimen. Continue HEP as Tolerated. Continue to Monitor. 12/25/2018  23minutes of  face to face patient care time was spent during this visit. All questions were encouraged and answered.  F/U in 1 month

## 2019-01-21 ENCOUNTER — Encounter: Payer: Self-pay | Admitting: Registered Nurse

## 2019-01-21 ENCOUNTER — Encounter: Payer: Medicare Other | Attending: Physical Medicine & Rehabilitation | Admitting: Registered Nurse

## 2019-01-21 ENCOUNTER — Other Ambulatory Visit: Payer: Self-pay

## 2019-01-21 VITALS — BP 132/71 | HR 70 | Temp 97.9°F | Ht 67.0 in | Wt 166.0 lb

## 2019-01-21 DIAGNOSIS — M5418 Radiculopathy, sacral and sacrococcygeal region: Secondary | ICD-10-CM | POA: Diagnosis not present

## 2019-01-21 DIAGNOSIS — M79671 Pain in right foot: Secondary | ICD-10-CM | POA: Diagnosis present

## 2019-01-21 DIAGNOSIS — E1140 Type 2 diabetes mellitus with diabetic neuropathy, unspecified: Secondary | ICD-10-CM

## 2019-01-21 DIAGNOSIS — E1342 Other specified diabetes mellitus with diabetic polyneuropathy: Secondary | ICD-10-CM | POA: Insufficient documentation

## 2019-01-21 DIAGNOSIS — G8929 Other chronic pain: Secondary | ICD-10-CM | POA: Insufficient documentation

## 2019-01-21 DIAGNOSIS — E1149 Type 2 diabetes mellitus with other diabetic neurological complication: Secondary | ICD-10-CM

## 2019-01-21 DIAGNOSIS — Z79891 Long term (current) use of opiate analgesic: Secondary | ICD-10-CM | POA: Diagnosis not present

## 2019-01-21 DIAGNOSIS — Z760 Encounter for issue of repeat prescription: Secondary | ICD-10-CM | POA: Diagnosis not present

## 2019-01-21 DIAGNOSIS — Z5181 Encounter for therapeutic drug level monitoring: Secondary | ICD-10-CM | POA: Diagnosis not present

## 2019-01-21 DIAGNOSIS — G894 Chronic pain syndrome: Secondary | ICD-10-CM

## 2019-01-21 DIAGNOSIS — Z86018 Personal history of other benign neoplasm: Secondary | ICD-10-CM

## 2019-01-21 DIAGNOSIS — D3610 Benign neoplasm of peripheral nerves and autonomic nervous system, unspecified: Secondary | ICD-10-CM | POA: Diagnosis not present

## 2019-01-21 MED ORDER — OXYCODONE HCL 15 MG PO TABS: 15.0000 mg | ORAL_TABLET | Freq: Three times a day (TID) | ORAL | 0 refills | Status: DC | PRN

## 2019-01-21 NOTE — Progress Notes (Signed)
Subjective:    Patient ID: Troy [name], male    DOB: 06/15/1945, 73 y.o.   MRN: [id number]  HPI: Troy [name] is a 73 y.o. male who returns for follow up appointment for chronic pain and medication refill. He states his pain is located in his right foot. He rates his pain 8. His current exercise regime is walking and performing light outside chores.  [name] Morphine equivalent is 67.50  MME.  UDS ordered today.   Pain Inventory Average Pain 8 Pain Right Now 8 My pain is aching  In the last 24 hours, has pain interfered with the following? General activity 7 Relation with others 8 Enjoyment of life 8 What TIME of day is your pain at its worst? daytime Sleep (in general) Good  Pain is worse with: walking and bending Pain improves with: rest Relief from Meds: 8  Mobility ability to climb steps?  yes do you drive?  yes  Function retired  Neuro/Psych bladder control problems depression  Prior Studies Any changes since last visit?  no  Physicians involved in your care Any changes since last visit?  no   Family History  Problem Relation Age of Onset   Hypertension Mother    Diabetes Mother    Stroke Mother        multiple   Hypertension Father    Stroke Father    Stroke Sister    Stroke Daughter    Social History   Socioeconomic History   Marital status: Married    Spouse name: Not on file   Number of children: Not on file   Years of education: Not on file   Highest education level: Not on file  Occupational History   Not on file  Social Needs   Financial resource strain: Not on file   Food insecurity    Worry: Not on file    Inability: Not on file   Transportation needs    Medical: Not on file    Non-medical: Not on file  Tobacco Use   Smoking status: Never Smoker   Smokeless tobacco: Never Used  Substance and Sexual Activity   Alcohol use: No    Alcohol/week: 0.0 standard drinks   Drug use: No   Sexual activity: Not  on file  Lifestyle   Physical activity    Days per week: Not on file    Minutes per session: Not on file   Stress: Not on file  Relationships   Social connections    Talks on phone: Not on file    Gets together: Not on file    Attends religious service: Not on file    Active member of club or organization: Not on file    Attends meetings of clubs or organizations: Not on file    Relationship status: Not on file  Other Topics Concern   Not on file  Social History Narrative   Not on file   Past Surgical History:  Procedure Laterality Date   CARDIAC SURGERY  2003   stent   IMPLANTATION VAGAL NERVE STIMULATOR     TRIGGER FINGER RELEASE     TUMOR REMOVAL  2002   S1 tumor removal by Dr. Annette Stable   VAGAL NERVE STIMULATOR REMOVAL     Past Medical History:  Diagnosis Date   Arthritis    Diabetes mellitus    High blood pressure    Plantar fasciitis    BP 132/71    Pulse 70  Temp 97.9 F (36.6 C)    Ht 5\' 7"  (1.702 m)    Wt 166 lb (75.3 kg)    SpO2 98%    BMI 26.00 kg/m   Opioid Risk Score:   Fall Risk Score:  `1  Depression screen PHQ 2/9  Depression screen [facility] 2/9 01/29/2018 12/31/2017 11/28/2017 08/27/2017 04/30/2017 02/27/2017 01/30/2017  Decreased Interest 0 0 0 0 0 0 0  Down, Depressed, Hopeless 1 1 0 0 0 0 0  PHQ - 2 Score 1 1 0 0 0 0 0  Altered sleeping - - - - - - -  Tired, decreased energy - - - - - - -  Change in appetite - - - - - - -  Feeling bad or failure about yourself  - - - - - - -  Trouble concentrating - - - - - - -  Moving slowly or fidgety/restless - - - - - - -  Suicidal thoughts - - - - - - -  PHQ-9 Score - - - - - - -     Review of Systems  Constitutional: Negative.   HENT: Negative.   Eyes: Negative.   Respiratory: Negative.   Cardiovascular: Negative.   Gastrointestinal: Negative.   Endocrine: Negative.   Genitourinary: Positive for difficulty urinating.  Musculoskeletal: Positive for arthralgias and back pain.  Skin:  Negative.   Allergic/Immunologic: Negative.   Neurological: Negative.   Hematological: Negative.   Psychiatric/Behavioral: Positive for dysphoric mood.  All other systems reviewed and are negative.      Objective:   Physical Exam Vitals signs and nursing note reviewed.  Constitutional:      Appearance: Normal appearance.  Neck:     Musculoskeletal: Normal range of motion and neck supple.  Cardiovascular:     Rate and Rhythm: Normal rate and regular rhythm.     Pulses: Normal pulses.     Heart sounds: Normal heart sounds.  Pulmonary:     Effort: Pulmonary effort is normal.     Breath sounds: Normal breath sounds.  Musculoskeletal:     Comments: Normal Muscle Bulk and Muscle Testing Reveals:  Upper Extremities: Full ROM and Muscle Strength 5/5 Lower Extremities: Full ROM and Muscle Strength 5/5 Arises from chair with ease Narrow Based Gait   Skin:    General: Skin is warm and dry.  Neurological:     Mental Status: He is alert and oriented to person, place, and time.  Psychiatric:        Mood and Affect: Mood normal.        Behavior: Behavior normal.           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  We will continue the opioid monitoring program, this consists of regular clinic visits, examinations, urine drug screen, pill counts as well as use of [place] Controlled Substance Reporting System. 2. Diabetic peripheral neuropathy affecting both feet: Continue current medication regimen with Gabapentin PCP Following. 01/21/2019 3. Chronic Low Back Pain: Continue current medication regimen. Continue HEP as Tolerated. Continue to Monitor.01/21/2019  86minutes of face to face patient care time was spent during this visit. All questions were encouraged and answered.  F/U in 1 month

## 2019-01-24 LAB — TOXASSURE SELECT,+ANTIDEPR,UR

## 2019-01-28 ENCOUNTER — Telehealth: Payer: Self-pay | Admitting: *Deleted

## 2019-01-28 NOTE — Telephone Encounter (Signed)
Urine drug screen for this encounter is consistent for prescribed medication 

## 2019-02-24 ENCOUNTER — Encounter: Payer: Self-pay | Admitting: Registered Nurse

## 2019-02-24 ENCOUNTER — Encounter: Payer: Medicare Other | Attending: Physical Medicine & Rehabilitation | Admitting: Registered Nurse

## 2019-02-24 ENCOUNTER — Other Ambulatory Visit: Payer: Self-pay

## 2019-02-24 VITALS — BP 119/63 | HR 64 | Temp 98.1°F | Ht 67.0 in | Wt 165.8 lb

## 2019-02-24 DIAGNOSIS — Z760 Encounter for issue of repeat prescription: Secondary | ICD-10-CM | POA: Insufficient documentation

## 2019-02-24 DIAGNOSIS — E1342 Other specified diabetes mellitus with diabetic polyneuropathy: Secondary | ICD-10-CM | POA: Diagnosis not present

## 2019-02-24 DIAGNOSIS — M5418 Radiculopathy, sacral and sacrococcygeal region: Secondary | ICD-10-CM | POA: Diagnosis not present

## 2019-02-24 DIAGNOSIS — M79671 Pain in right foot: Secondary | ICD-10-CM | POA: Diagnosis present

## 2019-02-24 DIAGNOSIS — D3610 Benign neoplasm of peripheral nerves and autonomic nervous system, unspecified: Secondary | ICD-10-CM | POA: Insufficient documentation

## 2019-02-24 DIAGNOSIS — E1149 Type 2 diabetes mellitus with other diabetic neurological complication: Secondary | ICD-10-CM

## 2019-02-24 DIAGNOSIS — Z5181 Encounter for therapeutic drug level monitoring: Secondary | ICD-10-CM | POA: Diagnosis not present

## 2019-02-24 DIAGNOSIS — G8929 Other chronic pain: Secondary | ICD-10-CM | POA: Diagnosis present

## 2019-02-24 DIAGNOSIS — G894 Chronic pain syndrome: Secondary | ICD-10-CM | POA: Diagnosis not present

## 2019-02-24 DIAGNOSIS — Z79891 Long term (current) use of opiate analgesic: Secondary | ICD-10-CM

## 2019-02-24 DIAGNOSIS — E1140 Type 2 diabetes mellitus with diabetic neuropathy, unspecified: Secondary | ICD-10-CM

## 2019-02-24 DIAGNOSIS — Z86018 Personal history of other benign neoplasm: Secondary | ICD-10-CM

## 2019-02-24 MED ORDER — OXYCODONE HCL 15 MG PO TABS: 15.0000 mg | ORAL_TABLET | Freq: Three times a day (TID) | ORAL | 0 refills | Status: DC | PRN

## 2019-02-24 NOTE — Progress Notes (Signed)
Subjective:    Patient ID: Troy [name], male    DOB: 1945/10/03, 74 y.o.   MRN: [id number]  HPI: Troy [name] is a 74 y.o. male who returns for follow up appointment for chronic pain and medication refill. He states his pain is located in his right foot. He rates his pain 8. His current exercise regime is walking and light yard work.  Troy [name] Morphine equivalent is 67.50  MME.  Last UDS was Performed on 01/21/2019, it was consistent.   Pain Inventory Average Pain 8 Pain Right Now 8 My pain is constant and aching  In the last 24 hours, has pain interfered with the following? General activity 8 Relation with others 7 Enjoyment of life 8 What TIME of day is your pain at its worst? morning daytime and evening Sleep (in general) NA  Pain is worse with: bending Pain improves with: rest and medication Relief from Meds: 8  Mobility how many minutes can you walk? 10-15 ability to climb steps?  yes do you drive?  yes  Function retired  Neuro/Psych numbness depression  Prior Studies Any changes since last visit?  no  Physicians involved in your care Any changes since last visit?  no   Family History  Problem Relation Age of Onset  . Hypertension Mother   . Diabetes Mother   . Stroke Mother        multiple  . Hypertension Father   . Stroke Father   . Stroke Sister   . Stroke Daughter    Social History   Socioeconomic History  . Marital status: Married    Spouse name: Not on file  . Number of children: Not on file  . Years of education: Not on file  . Highest education level: Not on file  Occupational History  . Not on file  Tobacco Use  . Smoking status: Never Smoker  . Smokeless tobacco: Never Used  Substance and Sexual Activity  . Alcohol use: No    Alcohol/week: 0.0 standard drinks  . Drug use: No  . Sexual activity: Not on file  Other Topics Concern  . Not on file  Social History Narrative  . Not on file   Social Determinants of Health    Financial Resource Strain:   . Difficulty of Paying Living Expenses: Not on file  Food Insecurity:   . Worried About Charity fundraiser in the Last Year: Not on file  . Ran Out of Food in the Last Year: Not on file  Transportation Needs:   . Lack of Transportation (Medical): Not on file  . Lack of Transportation (Non-Medical): Not on file  Physical Activity:   . Days of Exercise per Week: Not on file  . Minutes of Exercise per Session: Not on file  Stress:   . Feeling of Stress : Not on file  Social Connections:   . Frequency of Communication with Friends and Family: Not on file  . Frequency of Social Gatherings with Friends and Family: Not on file  . Attends Religious Services: Not on file  . Active Member of Clubs or Organizations: Not on file  . Attends Archivist Meetings: Not on file  . Marital Status: Not on file   Past Surgical History:  Procedure Laterality Date  . CARDIAC SURGERY  2003   stent  . IMPLANTATION VAGAL NERVE STIMULATOR    . TRIGGER FINGER RELEASE    . TUMOR REMOVAL  2002   S1 tumor  removal by Dr. Annette Stable  . VAGAL NERVE STIMULATOR REMOVAL     Past Medical History:  Diagnosis Date  . Arthritis   . Diabetes mellitus   . High blood pressure   . Plantar fasciitis    BP 119/63   Pulse 64   Temp 98.1 F (36.7 C)   Ht 5\' 7"  (1.702 m)   Wt 165 lb 12.8 oz (75.2 kg)   SpO2 97%   BMI 25.97 kg/m   Opioid Risk Score:   Fall Risk Score:  `1  Depression screen PHQ 2/9  Depression screen [facility] 2/9 02/24/2019 01/29/2018 12/31/2017 11/28/2017 08/27/2017 04/30/2017 02/27/2017  Decreased Interest 0 0 0 0 0 0 0  Down, Depressed, Hopeless 0 1 1 0 0 0 0  PHQ - 2 Score 0 1 1 0 0 0 0  Altered sleeping - - - - - - -  Tired, decreased energy - - - - - - -  Change in appetite - - - - - - -  Feeling bad or failure about yourself  - - - - - - -  Trouble concentrating - - - - - - -  Moving slowly or fidgety/restless - - - - - - -  Suicidal thoughts - - - - - - -   PHQ-9 Score - - - - - - -    Review of Systems  Constitutional: Negative.   HENT: Negative.   Eyes: Negative.   Respiratory: Negative.   Cardiovascular: Negative.   Gastrointestinal: Negative.   Endocrine:       High blood sugars  Genitourinary: Negative.   Musculoskeletal: Negative.   Skin: Negative.   Allergic/Immunologic: Negative.   Neurological: Positive for numbness.  Hematological: Negative.   Psychiatric/Behavioral: Positive for dysphoric mood.  All other systems reviewed and are negative.      Objective:   Physical Exam Vitals and nursing note reviewed.  Constitutional:      Appearance: Normal appearance.  Cardiovascular:     Rate and Rhythm: Normal rate and regular rhythm.     Pulses: Normal pulses.     Heart sounds: Normal heart sounds.  Pulmonary:     Effort: Pulmonary effort is normal.     Breath sounds: Normal breath sounds.  Musculoskeletal:     Cervical back: Normal range of motion and neck supple.     Comments: Normal Muscle Bulk and Muscle Testing Reveals:  Upper Extremities: Full ROM and Muscle Strength 5/5  Lower Extremities: Full ROM and Muscle Strength 5/5 Right Lower Extremity Flexion Produces Pain into his right foot Arises from chair with ease Narrow Based Gait Gait   Neurological:     Mental Status: He is alert and oriented to person, place, and time.  Psychiatric:        Mood and Affect: Mood normal.        Behavior: Behavior normal.           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  We will continue the opioid monitoring program, this consists of regular clinic visits, examinations, urine drug screen, pill counts as well as use of [place] Controlled Substance Reporting System. 2. Diabetic peripheral neuropathy affecting both feet: Continue current medication regimen with  Gabapentin PCP Following. 02/24/2019 3. Chronic Low Back Pain: Continue current medication regimen. Continue HEP as Tolerated. Continue to Monitor.02/24/2019  48minutes of face  to face patient care time was spent during this visit. All questions were encouraged and answered.  F/U in 1 month

## 2019-03-26 ENCOUNTER — Other Ambulatory Visit: Payer: Self-pay

## 2019-03-26 ENCOUNTER — Ambulatory Visit: Payer: Medicare Other | Admitting: Registered Nurse

## 2019-03-26 ENCOUNTER — Encounter: Payer: Medicare Other | Attending: Physical Medicine & Rehabilitation | Admitting: Registered Nurse

## 2019-03-26 ENCOUNTER — Encounter: Payer: Self-pay | Admitting: Registered Nurse

## 2019-03-26 VITALS — Ht 67.0 in | Wt 165.0 lb

## 2019-03-26 DIAGNOSIS — Z79891 Long term (current) use of opiate analgesic: Secondary | ICD-10-CM | POA: Insufficient documentation

## 2019-03-26 DIAGNOSIS — M5418 Radiculopathy, sacral and sacrococcygeal region: Secondary | ICD-10-CM | POA: Insufficient documentation

## 2019-03-26 DIAGNOSIS — Z86018 Personal history of other benign neoplasm: Secondary | ICD-10-CM

## 2019-03-26 DIAGNOSIS — Z5181 Encounter for therapeutic drug level monitoring: Secondary | ICD-10-CM | POA: Diagnosis not present

## 2019-03-26 DIAGNOSIS — E1149 Type 2 diabetes mellitus with other diabetic neurological complication: Secondary | ICD-10-CM

## 2019-03-26 DIAGNOSIS — E1140 Type 2 diabetes mellitus with diabetic neuropathy, unspecified: Secondary | ICD-10-CM

## 2019-03-26 DIAGNOSIS — D3610 Benign neoplasm of peripheral nerves and autonomic nervous system, unspecified: Secondary | ICD-10-CM | POA: Insufficient documentation

## 2019-03-26 DIAGNOSIS — Z760 Encounter for issue of repeat prescription: Secondary | ICD-10-CM | POA: Insufficient documentation

## 2019-03-26 DIAGNOSIS — M79671 Pain in right foot: Secondary | ICD-10-CM | POA: Insufficient documentation

## 2019-03-26 DIAGNOSIS — G894 Chronic pain syndrome: Secondary | ICD-10-CM

## 2019-03-26 DIAGNOSIS — E1342 Other specified diabetes mellitus with diabetic polyneuropathy: Secondary | ICD-10-CM | POA: Insufficient documentation

## 2019-03-26 DIAGNOSIS — G8929 Other chronic pain: Secondary | ICD-10-CM | POA: Insufficient documentation

## 2019-03-26 MED ORDER — OXYCODONE HCL 15 MG PO TABS: 15.0000 mg | ORAL_TABLET | Freq: Three times a day (TID) | ORAL | 0 refills | Status: DC | PRN

## 2019-03-26 NOTE — Progress Notes (Signed)
Subjective:    Patient ID: Troy [name], male    DOB: 1946/02/08, 74 y.o.   MRN: [id number]  HPI: Troy [name] is a 74 y.o. male whose appointment was changed to a virtual office visit to reduce the risk of exposure to the COVID-19 virus and to help Troy [name] remain healthy and safe. The virtual visit will also provide continuity of care. This provider spoke with [name] due to [name] is hard of hearing and [name] agrees with virtual visit and verbalizes understanding. She states his pain is located in his right foot with tingling and burning.  He rates his pain 8. His  current exercise regime is walking and performing household chores.  Mr. Roel Morphine equivalent is 67.50  MME.    Last UDS was Performed on 01/21/2019, it was consistent.   [name] CMA asked The Health and History Questions. This provider and [name] verified we were speaking with the correct person using two identifiers.   Pain Inventory Average Pain 8 Pain Right Now 8 My pain is constant, burning and dull  In the last 24 hours, has pain interfered with the following? General activity 8 Relation with others 2 Enjoyment of life 5 What TIME of day is your pain at its worst? morning Sleep (in general) Good  Pain is worse with: walking and some activites Pain improves with: rest, heat/ice and medication Relief from Meds: 8  Mobility walk without assistance do you drive?  yes  Function retired  Neuro/Psych bowel control problems numbness depression  Prior Studies Any changes since last visit?  no  Physicians involved in your care Any changes since last visit?  no   Family History  Problem Relation Age of Onset  . Hypertension Mother   . Diabetes Mother   . Stroke Mother        multiple  . Hypertension Father   . Stroke Father   . Stroke Sister   . Stroke Daughter    Social History   Socioeconomic History  . Marital status: Married    Spouse name: Not on file  .  Number of children: Not on file  . Years of education: Not on file  . Highest education level: Not on file  Occupational History  . Not on file  Tobacco Use  . Smoking status: Never Smoker  . Smokeless tobacco: Never Used  Substance and Sexual Activity  . Alcohol use: No    Alcohol/week: 0.0 standard drinks  . Drug use: No  . Sexual activity: Not on file  Other Topics Concern  . Not on file  Social History Narrative  . Not on file   Social Determinants of Health   Financial Resource Strain:   . Difficulty of Paying Living Expenses: Not on file  Food Insecurity:   . Worried About Charity fundraiser in the Last Year: Not on file  . Ran Out of Food in the Last Year: Not on file  Transportation Needs:   . Lack of Transportation (Medical): Not on file  . Lack of Transportation (Non-Medical): Not on file  Physical Activity:   . Days of Exercise per Week: Not on file  . Minutes of Exercise per Session: Not on file  Stress:   . Feeling of Stress : Not on file  Social Connections:   . Frequency of Communication with Friends and Family: Not on file  . Frequency of Social Gatherings with Friends and Family: Not on file  .  Attends Religious Services: Not on file  . Active Member of Clubs or Organizations: Not on file  . Attends Archivist Meetings: Not on file  . Marital Status: Not on file   Past Surgical History:  Procedure Laterality Date  . CARDIAC SURGERY  2003   stent  . IMPLANTATION VAGAL NERVE STIMULATOR    . TRIGGER FINGER RELEASE    . TUMOR REMOVAL  2002   S1 tumor removal by Dr. Annette Stable  . VAGAL NERVE STIMULATOR REMOVAL     Past Medical History:  Diagnosis Date  . Arthritis   . Diabetes mellitus   . High blood pressure   . Plantar fasciitis    There were no vitals taken for this visit.  Opioid Risk Score:   Fall Risk Score:  `1  Depression screen PHQ 2/9  Depression screen [facility] 2/9 02/24/2019 01/29/2018 12/31/2017 11/28/2017 08/27/2017 04/30/2017  02/27/2017  Decreased Interest 1 0 0 0 0 0 0  Down, Depressed, Hopeless 1 1 1  0 0 0 0  PHQ - 2 Score 2 1 1  0 0 0 0  Altered sleeping - - - - - - -  Tired, decreased energy - - - - - - -  Change in appetite - - - - - - -  Feeling bad or failure about yourself  - - - - - - -  Trouble concentrating - - - - - - -  Moving slowly or fidgety/restless - - - - - - -  Suicidal thoughts - - - - - - -  PHQ-9 Score - - - - - - -    Review of Systems  Constitutional: Negative.   HENT: Negative.   Eyes: Negative.   Respiratory: Negative.   Cardiovascular: Negative.   Gastrointestinal: Positive for constipation.  Endocrine: Negative.   Genitourinary: Negative.   Musculoskeletal: Positive for back pain.  Skin: Negative.   Allergic/Immunologic: Negative.   Neurological: Positive for numbness.  Hematological: Negative.   Psychiatric/Behavioral: Positive for dysphoric mood.  All other systems reviewed and are negative.      Objective:   Physical Exam Vitals and nursing note reviewed.  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  We will continue the opioid monitoring program, this consists of regular clinic visits, examinations, urine drug screen, pill counts as well as use of [place] Controlled Substance Reporting System. 2. Diabetic peripheral neuropathy affecting both feet: Continue current medication regimen with Gabapentin PCP Following. 03/26/2019 3. Chronic Low Back Pain: No complaints today.Continue current medication regimen. Continue HEP as Tolerated. Continue to Monitor.03/26/2019  F/U in 1 month  Tele-Health Visit Telephone Call Established Patient Location Of Patient: In His Home Location of Provider: In the Office Total Time  Spent: 10 Minutes

## 2019-04-23 ENCOUNTER — Telehealth: Payer: Self-pay | Admitting: *Deleted

## 2019-04-23 ENCOUNTER — Encounter: Payer: Self-pay | Admitting: Registered Nurse

## 2019-04-23 ENCOUNTER — Encounter: Payer: Medicare Other | Attending: Physical Medicine & Rehabilitation | Admitting: Registered Nurse

## 2019-04-23 ENCOUNTER — Other Ambulatory Visit: Payer: Self-pay

## 2019-04-23 VITALS — Ht 67.0 in | Wt 165.0 lb

## 2019-04-23 DIAGNOSIS — D3610 Benign neoplasm of peripheral nerves and autonomic nervous system, unspecified: Secondary | ICD-10-CM | POA: Insufficient documentation

## 2019-04-23 DIAGNOSIS — M79671 Pain in right foot: Secondary | ICD-10-CM | POA: Insufficient documentation

## 2019-04-23 DIAGNOSIS — E1140 Type 2 diabetes mellitus with diabetic neuropathy, unspecified: Secondary | ICD-10-CM | POA: Diagnosis not present

## 2019-04-23 DIAGNOSIS — Z86018 Personal history of other benign neoplasm: Secondary | ICD-10-CM | POA: Diagnosis not present

## 2019-04-23 DIAGNOSIS — G8929 Other chronic pain: Secondary | ICD-10-CM | POA: Insufficient documentation

## 2019-04-23 DIAGNOSIS — Z5181 Encounter for therapeutic drug level monitoring: Secondary | ICD-10-CM | POA: Insufficient documentation

## 2019-04-23 DIAGNOSIS — G894 Chronic pain syndrome: Secondary | ICD-10-CM | POA: Diagnosis not present

## 2019-04-23 DIAGNOSIS — Z760 Encounter for issue of repeat prescription: Secondary | ICD-10-CM | POA: Insufficient documentation

## 2019-04-23 DIAGNOSIS — E1342 Other specified diabetes mellitus with diabetic polyneuropathy: Secondary | ICD-10-CM | POA: Insufficient documentation

## 2019-04-23 DIAGNOSIS — E1149 Type 2 diabetes mellitus with other diabetic neurological complication: Secondary | ICD-10-CM

## 2019-04-23 DIAGNOSIS — Z79891 Long term (current) use of opiate analgesic: Secondary | ICD-10-CM | POA: Insufficient documentation

## 2019-04-23 DIAGNOSIS — M5418 Radiculopathy, sacral and sacrococcygeal region: Secondary | ICD-10-CM | POA: Insufficient documentation

## 2019-04-23 MED ORDER — OXYCODONE HCL 15 MG PO TABS: 15.0000 mg | ORAL_TABLET | Freq: Three times a day (TID) | ORAL | 0 refills | Status: DC | PRN

## 2019-04-23 NOTE — Progress Notes (Signed)
Subjective:     Patient ID: Troy [name], male   DOB: [birth year], 74 y.o.   MRN: [id number]  HPI: Troy [name] is a 74 y.o. male whose appointment was changed to a virtual office visit to reduce the risk of exposure to the COVID-19 virus and to help Troy [name]  remain healthy and safe. The virtual visit will also provide continuity of care.  This provider spoke with Troy [name] and she answered the questions in regard to Troy [name]. Troy [name] is hard of hearing, Troy [name] stated Troy [name] was at home and she was the doctor's office with her son.   She states his pain is located in his right foot. Pain was  Rated 8. His  current exercise regime is walking and doing household chores.   Troy [name] Morphine equivalent is 67.50  MME.  Last UDS was Performed on 01/21/2019, it was consistent.   Placed a call to Troy [name] to call office when she returns home, awaiting a return call.   This provider spoke with Troy [name], his wife asked him the questions and he answered.   Troy [name] apologetic of how she spoke with Troy [name] Troy [name], she said her son had a seizure and had fallen, she was under stress. This will be relayed to [employer].   Troy [name] Troy [name] asked the Health and History Questions. This provider and Troy [name] verified we were speaking with Troy [name], using two identifiers.     Pain Inventory Average Pain 8 Pain Right Now 8 My pain is constant  In the last 24 hours, has pain interfered with the following? General activity n/a Relation with others n/a Enjoyment of life n/a What TIME of day is your pain at its worst? varies with activity Sleep (in general) Good  Pain is worse with: walking and standing Pain improves with: medication Relief from Meds: 8  Mobility walk without assistance  Function retired  Neuro/Psych No problems in this area  Prior Studies Any changes since last visit?  no  Physicians involved in your care Any changes since last visit?   no   Family History  Problem Relation Age of Onset  . Hypertension Mother   . Diabetes Mother   . Stroke Mother        multiple  . Hypertension Father   . Stroke Father   . Stroke Sister   . Stroke Daughter    Social History   Socioeconomic History  . Marital status: Married    Spouse name: Not on file  . Number of children: Not on file  . Years of education: Not on file  . Highest education level: Not on file  Occupational History  . Not on file  Tobacco Use  . Smoking status: Never Smoker  . Smokeless tobacco: Never Used  Substance and Sexual Activity  . Alcohol use: No    Alcohol/week: 0.0 standard drinks  . Drug use: No  . Sexual activity: Not on file  Other Topics Concern  . Not on file  Social History Narrative  . Not on file   Social Determinants of Health   Financial Resource Strain:   . Difficulty of Paying Living Expenses: Not on file  Food Insecurity:   . Worried About Charity fundraiser in the Last Year: Not on file  . Ran Out of Food in the Last Year: Not on file  Transportation Needs:   . Lack of Transportation (Medical): Not on file  .  Lack of Transportation (Non-Medical): Not on file  Physical Activity:   . Days of Exercise per Week: Not on file  . Minutes of Exercise per Session: Not on file  Stress:   . Feeling of Stress : Not on file  Social Connections:   . Frequency of Communication with Friends and Family: Not on file  . Frequency of Social Gatherings with Friends and Family: Not on file  . Attends Religious Services: Not on file  . Active Member of Clubs or Organizations: Not on file  . Attends Archivist Meetings: Not on file  . Marital Status: Not on file   Past Surgical History:  Procedure Laterality Date  . CARDIAC SURGERY  2003   stent  . IMPLANTATION VAGAL NERVE STIMULATOR    . TRIGGER FINGER RELEASE    . TUMOR REMOVAL  2002   S1 tumor removal by Dr. Annette Stable  . VAGAL NERVE STIMULATOR REMOVAL     Past Medical  History:  Diagnosis Date  . Arthritis   . Diabetes mellitus   . High blood pressure   . Plantar fasciitis    Ht 5\' 7"  (1.702 m)   Wt 165 lb (74.8 kg) Comment: previous  BMI 25.84 kg/m   Opioid Risk Score:   Fall Risk Score:  `1  Depression screen PHQ 2/9  Depression screen [facility] 2/9 02/24/2019 01/29/2018 12/31/2017 11/28/2017 08/27/2017 04/30/2017 02/27/2017  Decreased Interest 1 0 0 0 0 0 0  Down, Depressed, Hopeless 1 1 1  0 0 0 0  PHQ - 2 Score 2 1 1  0 0 0 0  Altered sleeping - - - - - - -  Tired, decreased energy - - - - - - -  Change in appetite - - - - - - -  Feeling bad or failure about yourself  - - - - - - -  Trouble concentrating - - - - - - -  Moving slowly or fidgety/restless - - - - - - -  Suicidal thoughts - - - - - - -  PHQ-9 Score - - - - - - -    Review of Systems  Constitutional: Negative.   HENT: Negative.   Eyes: Negative.   Respiratory: Negative.   Cardiovascular: Negative.   Gastrointestinal: Negative.   Endocrine: Negative.   Genitourinary: Negative.   Musculoskeletal: Negative.   Skin: Negative.   Allergic/Immunologic: Negative.   Neurological: Negative.   Hematological: Negative.   Psychiatric/Behavioral: Negative.        Objective:   Physical Exam Vitals and nursing note reviewed.  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  We will continue the opioid monitoring program, this consists of regular clinic visits, examinations, urine drug screen, pill counts as well as use of [place] Controlled Substance Reporting System. 2. Diabetic peripheral neuropathy affecting both feet: Continue current medication regimen with Gabapentin PCP Following. 04/23/2019 3. Chronic Low Back Pain: No complaints today.Continue current  medication regimen. Continue HEP as Tolerated. Continue to Monitor.04/23/2019  F/U in 1 month  Tele-Health Visit Telephone Call Established Patient Location Of Patient: In His Home Location of Provider: In the Office Total Time Spent: 10 Minutes

## 2019-04-23 NOTE — Telephone Encounter (Signed)
I contacted patient to check him in for a telephone visit. His wife answered the phone and proceeded to say that she would be handling the visit.  I asked if the patient was nearby and she initially said yes..  I proceeded with the check in process. As I continued I asked about his pain medication. I asked for the fill date the dispense count and how many pills he had left.  At this point she admitted that she did not have the medication container with her and that the patient was actually at home, while she was at the doctors office.  I brought up that the provider might not be able to do an appointment without the patient present and she proceeded to get angry stating that he can't hear that she has done this before. She stated that she is doing [place] a favor by not coming into the office.  She stated that she had to leave the house because her son had a fracture. I de-escalated the situation and proceeded to finish the intake.  I transferred the call to the provider.

## 2019-05-26 ENCOUNTER — Encounter: Payer: Self-pay | Admitting: Registered Nurse

## 2019-05-26 ENCOUNTER — Other Ambulatory Visit: Payer: Self-pay

## 2019-05-26 ENCOUNTER — Encounter: Payer: Medicare Other | Attending: Physical Medicine & Rehabilitation | Admitting: Registered Nurse

## 2019-05-26 VITALS — BP 137/69 | HR 67 | Temp 97.9°F | Ht 67.0 in | Wt 165.2 lb

## 2019-05-26 DIAGNOSIS — Z86018 Personal history of other benign neoplasm: Secondary | ICD-10-CM | POA: Diagnosis not present

## 2019-05-26 DIAGNOSIS — G8929 Other chronic pain: Secondary | ICD-10-CM | POA: Diagnosis present

## 2019-05-26 DIAGNOSIS — Z79891 Long term (current) use of opiate analgesic: Secondary | ICD-10-CM | POA: Diagnosis not present

## 2019-05-26 DIAGNOSIS — Z760 Encounter for issue of repeat prescription: Secondary | ICD-10-CM | POA: Insufficient documentation

## 2019-05-26 DIAGNOSIS — E1342 Other specified diabetes mellitus with diabetic polyneuropathy: Secondary | ICD-10-CM | POA: Diagnosis not present

## 2019-05-26 DIAGNOSIS — G894 Chronic pain syndrome: Secondary | ICD-10-CM | POA: Diagnosis not present

## 2019-05-26 DIAGNOSIS — M79671 Pain in right foot: Secondary | ICD-10-CM | POA: Insufficient documentation

## 2019-05-26 DIAGNOSIS — Z5181 Encounter for therapeutic drug level monitoring: Secondary | ICD-10-CM

## 2019-05-26 DIAGNOSIS — D3610 Benign neoplasm of peripheral nerves and autonomic nervous system, unspecified: Secondary | ICD-10-CM | POA: Insufficient documentation

## 2019-05-26 DIAGNOSIS — M5418 Radiculopathy, sacral and sacrococcygeal region: Secondary | ICD-10-CM | POA: Insufficient documentation

## 2019-05-26 DIAGNOSIS — E1149 Type 2 diabetes mellitus with other diabetic neurological complication: Secondary | ICD-10-CM

## 2019-05-26 DIAGNOSIS — E1140 Type 2 diabetes mellitus with diabetic neuropathy, unspecified: Secondary | ICD-10-CM

## 2019-05-26 MED ORDER — OXYCODONE HCL 15 MG PO TABS: 15.0000 mg | ORAL_TABLET | Freq: Three times a day (TID) | ORAL | 0 refills | Status: DC | PRN

## 2019-05-26 NOTE — Progress Notes (Signed)
Subjective:    Patient ID: [name], male    DOB: 10-01-1945, 74 y.o.   MRN: [id number]  HPI: JAMYRION VENTURO is a 74 y.o. male who returns for follow up appointment for chronic pain and medication refill. He states his pain is located in his right foot with numbness. He rates his pain 8. His current exercise regime is walking and light yard work.  Mr. Portis Morphine equivalent is 67.50   MME.UDS ordered Today.     Pain Inventory Average Pain 9 Pain Right Now 8 My pain is burning and aching  In the last 24 hours, has pain interfered with the following? General activity 9 Relation with others 8 Enjoyment of life 8 What TIME of day is your pain at its worst? daytime and evening Sleep (in general) Good  Pain is worse with: bending Pain improves with: rest and medication Relief from Meds: 8  Mobility how many minutes can you walk? 10 ability to climb steps?  yes do you drive?  yes  Function retired  Neuro/Psych bladder control problems numbness depression  Prior Studies Any changes since last visit?  no  Physicians involved in your care Any changes since last visit?  no   Family History  Problem Relation Age of Onset  . Hypertension Mother   . Diabetes Mother   . Stroke Mother        multiple  . Hypertension Father   . Stroke Father   . Stroke Sister   . Stroke Daughter    Social History   Socioeconomic History  . Marital status: Married    Spouse name: Not on file  . Number of children: Not on file  . Years of education: Not on file  . Highest education level: Not on file  Occupational History  . Not on file  Tobacco Use  . Smoking status: Never Smoker  . Smokeless tobacco: Never Used  Substance and Sexual Activity  . Alcohol use: No    Alcohol/week: 0.0 standard drinks  . Drug use: No  . Sexual activity: Not on file  Other Topics Concern  . Not on file  Social History Narrative  . Not on file   Social Determinants of Health    Financial Resource Strain:   . Difficulty of Paying Living Expenses:   Food Insecurity:   . Worried About Charity fundraiser in the Last Year:   . Arboriculturist in the Last Year:   Transportation Needs:   . Film/video editor (Medical):   Marland Kitchen Lack of Transportation (Non-Medical):   Physical Activity:   . Days of Exercise per Week:   . Minutes of Exercise per Session:   Stress:   . Feeling of Stress :   Social Connections:   . Frequency of Communication with Friends and Family:   . Frequency of Social Gatherings with Friends and Family:   . Attends Religious Services:   . Active Member of Clubs or Organizations:   . Attends Archivist Meetings:   Marland Kitchen Marital Status:    Past Surgical History:  Procedure Laterality Date  . CARDIAC SURGERY  2003   stent  . IMPLANTATION VAGAL NERVE STIMULATOR    . TRIGGER FINGER RELEASE    . TUMOR REMOVAL  2002   S1 tumor removal by Dr. Annette Stable  . VAGAL NERVE STIMULATOR REMOVAL     Past Medical History:  Diagnosis Date  . Arthritis   . Diabetes mellitus   .  High blood pressure   . Plantar fasciitis    BP 137/69   Pulse 67   Temp 97.9 F (36.6 C)   Ht 5\' 7"  (1.702 m)   Wt 165 lb 3.2 oz (74.9 kg)   SpO2 96%   BMI 25.87 kg/m   Opioid Risk Score:   Fall Risk Score:  `1  Depression screen PHQ 2/9  Depression screen [facility] 2/9 02/24/2019 01/29/2018 12/31/2017 11/28/2017 08/27/2017 04/30/2017 02/27/2017  Decreased Interest 1 0 0 0 0 0 0  Down, Depressed, Hopeless 1 1 1  0 0 0 0  PHQ - 2 Score 2 1 1  0 0 0 0  Altered sleeping - - - - - - -  Tired, decreased energy - - - - - - -  Change in appetite - - - - - - -  Feeling bad or failure about yourself  - - - - - - -  Trouble concentrating - - - - - - -  Moving slowly or fidgety/restless - - - - - - -  Suicidal thoughts - - - - - - -  PHQ-9 Score - - - - - - -     Review of Systems  Respiratory: Positive for apnea.   Genitourinary: Positive for urgency.  Neurological: Positive  for numbness.  Psychiatric/Behavioral: Positive for dysphoric mood.  All other systems reviewed and are negative.      Objective:   Physical Exam Vitals and nursing note reviewed.  Constitutional:      Appearance: Normal appearance.  Cardiovascular:     Rate and Rhythm: Normal rate and regular rhythm.     Pulses: Normal pulses.     Heart sounds: Normal heart sounds.  Pulmonary:     Effort: Pulmonary effort is normal.     Breath sounds: Normal breath sounds.  Musculoskeletal:     Cervical back: Normal range of motion and neck supple.     Comments: Normal Muscle Bulk and Muscle Testing Reveals:  Upper Extremities: Full ROM and Muscle Strength 5/5  Lower Extremities: Full ROM and Muscle Strength 5/5 Arises from chair with ease Narrow Based Gait   Skin:    General: Skin is warm and dry.  Neurological:     Mental Status: He is alert and oriented to person, place, and time.  Psychiatric:        Mood and Affect: Mood normal.        Behavior: Behavior normal.           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  We will continue the opioid monitoring program, this consists of regular clinic visits, examinations, urine drug screen, pill counts as well as use of [place] Controlled Substance Reporting System. 2. Diabetic peripheral neuropathy affecting both feet: Continue current medication regimen with Gabapentin PCP Following. 05/26/2019 3. Chronic Low Back Pain:No complaints today.Continue current medication regimen. Continue HEP as Tolerated. Continue to Monitor.05/26/2019  F/U in 1 month  15 minutes of face to face patient care time was spent during this visit. All questions were encouraged and answered.

## 2019-05-28 LAB — TOXASSURE SELECT,+ANTIDEPR,UR

## 2019-06-02 ENCOUNTER — Telehealth: Payer: Self-pay

## 2019-06-02 NOTE — Telephone Encounter (Signed)
UDS RESULTS CONSISTENT WITH MEDIACATIONS ON FILE 

## 2019-06-25 ENCOUNTER — Other Ambulatory Visit: Payer: Self-pay

## 2019-06-25 ENCOUNTER — Encounter: Payer: Self-pay | Admitting: Registered Nurse

## 2019-06-25 ENCOUNTER — Encounter: Payer: Medicare Other | Attending: Physical Medicine & Rehabilitation | Admitting: Registered Nurse

## 2019-06-25 VITALS — BP 155/66 | HR 62 | Temp 97.7°F | Ht 67.0 in | Wt 163.2 lb

## 2019-06-25 DIAGNOSIS — Z760 Encounter for issue of repeat prescription: Secondary | ICD-10-CM | POA: Insufficient documentation

## 2019-06-25 DIAGNOSIS — E1342 Other specified diabetes mellitus with diabetic polyneuropathy: Secondary | ICD-10-CM | POA: Insufficient documentation

## 2019-06-25 DIAGNOSIS — M79671 Pain in right foot: Secondary | ICD-10-CM | POA: Diagnosis present

## 2019-06-25 DIAGNOSIS — Z79891 Long term (current) use of opiate analgesic: Secondary | ICD-10-CM

## 2019-06-25 DIAGNOSIS — M5418 Radiculopathy, sacral and sacrococcygeal region: Secondary | ICD-10-CM | POA: Diagnosis not present

## 2019-06-25 DIAGNOSIS — E1140 Type 2 diabetes mellitus with diabetic neuropathy, unspecified: Secondary | ICD-10-CM

## 2019-06-25 DIAGNOSIS — D3610 Benign neoplasm of peripheral nerves and autonomic nervous system, unspecified: Secondary | ICD-10-CM | POA: Insufficient documentation

## 2019-06-25 DIAGNOSIS — G8929 Other chronic pain: Secondary | ICD-10-CM | POA: Insufficient documentation

## 2019-06-25 DIAGNOSIS — Z86018 Personal history of other benign neoplasm: Secondary | ICD-10-CM | POA: Diagnosis not present

## 2019-06-25 DIAGNOSIS — Z5181 Encounter for therapeutic drug level monitoring: Secondary | ICD-10-CM

## 2019-06-25 DIAGNOSIS — G894 Chronic pain syndrome: Secondary | ICD-10-CM

## 2019-06-25 DIAGNOSIS — E1149 Type 2 diabetes mellitus with other diabetic neurological complication: Secondary | ICD-10-CM

## 2019-06-25 MED ORDER — OXYCODONE HCL 15 MG PO TABS: 15.0000 mg | ORAL_TABLET | Freq: Three times a day (TID) | ORAL | 0 refills | Status: DC | PRN

## 2019-06-25 NOTE — Progress Notes (Signed)
Subjective:    Patient ID: Troy [name], male    DOB: [birth year], 74 y.o.   MRN: [id number]  HPI: Troy [name] is a 74 y.o. male who returns for follow up appointment for chronic pain and medication refill. He states his pain is located in his right foot. He rates his pain 8. His current exercise regime is walking and performing stretching exercises.  Mr. Wallen Morphine equivalent is 67.50 MME.    Last UDS was Performed on 05/26/2019, it was consistent.   Pain Inventory Average Pain 9 Pain Right Now 8 My pain is aching  In the last 24 hours, has pain interfered with the following? General activity 8 Relation with others 9 Enjoyment of life 9 What TIME of day is your pain at its worst? daytime and evening Sleep (in general) Good  Pain is worse with: bending and some activites Pain improves with: rest and medication Relief from Meds: 8  Mobility how many minutes can you walk? 10-15 ability to climb steps?  yes Do you have any goals in this area?  yes  Function retired Do you have any goals in this area?  yes  Neuro/Psych numbness depression  Prior Studies Any changes since last visit?  no  Physicians involved in your care Any changes since last visit?  no   Family History  Problem Relation Age of Onset  . Hypertension Mother   . Diabetes Mother   . Stroke Mother        multiple  . Hypertension Father   . Stroke Father   . Stroke Sister   . Stroke Daughter    Social History   Socioeconomic History  . Marital status: Married    Spouse name: Not on file  . Number of children: Not on file  . Years of education: Not on file  . Highest education level: Not on file  Occupational History  . Not on file  Tobacco Use  . Smoking status: Never Smoker  . Smokeless tobacco: Never Used  Substance and Sexual Activity  . Alcohol use: No    Alcohol/week: 0.0 standard drinks  . Drug use: No  . Sexual activity: Not on file  Other Topics Concern  . Not on file   Social History Narrative  . Not on file   Social Determinants of Health   Financial Resource Strain:   . Difficulty of Paying Living Expenses:   Food Insecurity:   . Worried About Charity fundraiser in the Last Year:   . Arboriculturist in the Last Year:   Transportation Needs:   . Film/video editor (Medical):   Marland Kitchen Lack of Transportation (Non-Medical):   Physical Activity:   . Days of Exercise per Week:   . Minutes of Exercise per Session:   Stress:   . Feeling of Stress :   Social Connections:   . Frequency of Communication with Friends and Family:   . Frequency of Social Gatherings with Friends and Family:   . Attends Religious Services:   . Active Member of Clubs or Organizations:   . Attends Archivist Meetings:   Marland Kitchen Marital Status:    Past Surgical History:  Procedure Laterality Date  . CARDIAC SURGERY  2003   stent  . IMPLANTATION VAGAL NERVE STIMULATOR    . TRIGGER FINGER RELEASE    . TUMOR REMOVAL  2002   S1 tumor removal by Dr. Annette Stable  . VAGAL NERVE STIMULATOR REMOVAL  Past Medical History:  Diagnosis Date  . Arthritis   . Diabetes mellitus   . High blood pressure   . Plantar fasciitis    BP (!) 155/66   Pulse 62   Temp 97.7 F (36.5 C)   Ht 5\' 7"  (1.702 m)   Wt 163 lb 3.2 oz (74 kg)   BMI 25.56 kg/m   Opioid Risk Score:   Fall Risk Score:  `1  Depression screen PHQ 2/9  Depression screen [facility] 2/9 06/25/2019 02/24/2019 01/29/2018 12/31/2017 11/28/2017 08/27/2017 04/30/2017  Decreased Interest 1 1 0 0 0 0 0  Down, Depressed, Hopeless 1 1 1 1  0 0 0  PHQ - 2 Score 2 2 1 1  0 0 0  Altered sleeping - - - - - - -  Tired, decreased energy - - - - - - -  Change in appetite - - - - - - -  Feeling bad or failure about yourself  - - - - - - -  Trouble concentrating - - - - - - -  Moving slowly or fidgety/restless - - - - - - -  Suicidal thoughts - - - - - - -  PHQ-9 Score - - - - - - -   Review of Systems  Constitutional: Negative.   HENT:  Negative.   Eyes: Negative.   Respiratory: Positive for apnea.   Cardiovascular: Negative.   Gastrointestinal: Negative.   Endocrine:       High blood sugars  Genitourinary: Negative.   Musculoskeletal: Negative.   Skin: Negative.   Allergic/Immunologic: Negative.   Neurological: Positive for numbness.  Hematological: Negative.   Psychiatric/Behavioral: Positive for dysphoric mood.  All other systems reviewed and are negative.      Objective:   Physical Exam Vitals and nursing note reviewed.  Constitutional:      Appearance: Normal appearance.  Cardiovascular:     Rate and Rhythm: Normal rate and regular rhythm.     Pulses: Normal pulses.     Heart sounds: Normal heart sounds.  Pulmonary:     Effort: Pulmonary effort is normal.     Breath sounds: Normal breath sounds.  Musculoskeletal:     Cervical back: Normal range of motion and neck supple.     Comments: Normal Muscle Bulk and Muscle Testing Reveals:  Upper Extremities: Full ROM and Muscle Strength 5/5  Lower Extremities: Full ROM and Muscle Strength 5/5 Arises from chair with ease Narrow Based Gait   Skin:    General: Skin is warm and dry.  Neurological:     Mental Status: He is alert and oriented to person, place, and time.  Psychiatric:        Mood and Affect: Mood normal.        Behavior: Behavior normal.           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  We will continue the opioid monitoring program, this consists of regular clinic visits, examinations, urine drug screen, pill counts as well as use of [place] Controlled Substance Reporting System. 2. Diabetic peripheral neuropathy affecting both feet: Continue current medication regimen with Gabapentin PCP Following. 06/25/2019 3. Chronic Low Back Pain:No complaints today.Continue current  medication regimen. Continue HEP as Tolerated. Continue to Monitor.06/25/2019  F/U in 1 month  15 minutes of face to face patient care time was spent during this  visit. All questions were encouraged and answered.

## 2019-07-23 ENCOUNTER — Ambulatory Visit: Payer: Medicare Other | Admitting: Registered Nurse

## 2019-07-24 ENCOUNTER — Other Ambulatory Visit: Payer: Self-pay

## 2019-07-24 ENCOUNTER — Encounter: Payer: Self-pay | Admitting: Registered Nurse

## 2019-07-24 ENCOUNTER — Encounter: Payer: Medicare Other | Attending: Physical Medicine & Rehabilitation | Admitting: Registered Nurse

## 2019-07-24 VITALS — BP 101/61 | HR 65 | Temp 97.7°F | Ht 67.0 in | Wt 160.2 lb

## 2019-07-24 DIAGNOSIS — Z79891 Long term (current) use of opiate analgesic: Secondary | ICD-10-CM

## 2019-07-24 DIAGNOSIS — E1149 Type 2 diabetes mellitus with other diabetic neurological complication: Secondary | ICD-10-CM

## 2019-07-24 DIAGNOSIS — G894 Chronic pain syndrome: Secondary | ICD-10-CM

## 2019-07-24 DIAGNOSIS — Z86018 Personal history of other benign neoplasm: Secondary | ICD-10-CM | POA: Diagnosis not present

## 2019-07-24 DIAGNOSIS — G8929 Other chronic pain: Secondary | ICD-10-CM | POA: Insufficient documentation

## 2019-07-24 DIAGNOSIS — Z760 Encounter for issue of repeat prescription: Secondary | ICD-10-CM | POA: Diagnosis not present

## 2019-07-24 DIAGNOSIS — M5418 Radiculopathy, sacral and sacrococcygeal region: Secondary | ICD-10-CM | POA: Insufficient documentation

## 2019-07-24 DIAGNOSIS — D3610 Benign neoplasm of peripheral nerves and autonomic nervous system, unspecified: Secondary | ICD-10-CM | POA: Diagnosis not present

## 2019-07-24 DIAGNOSIS — E1342 Other specified diabetes mellitus with diabetic polyneuropathy: Secondary | ICD-10-CM | POA: Diagnosis not present

## 2019-07-24 DIAGNOSIS — Z5181 Encounter for therapeutic drug level monitoring: Secondary | ICD-10-CM

## 2019-07-24 DIAGNOSIS — M79671 Pain in right foot: Secondary | ICD-10-CM | POA: Insufficient documentation

## 2019-07-24 DIAGNOSIS — E1140 Type 2 diabetes mellitus with diabetic neuropathy, unspecified: Secondary | ICD-10-CM | POA: Diagnosis not present

## 2019-07-24 MED ORDER — OXYCODONE HCL 15 MG PO TABS: 15.0000 mg | ORAL_TABLET | Freq: Three times a day (TID) | ORAL | 0 refills | Status: DC | PRN

## 2019-07-24 NOTE — Progress Notes (Signed)
Subjective:    Patient ID: Troy [name], male    DOB: Jan 20, 1946, 74 y.o.   MRN: [id number]  HPI: Troy [name] is a 74 y.o. male who returns for follow up appointment for chronic pain and medication refill. He states his pain is located in his right foot with numbness. He rates his pain 9. His current exercise regime is walking and performing outside chores.   Mr. Madray Morphine equivalent is 67.50  MME.  Last UDS was Performed on 05/26/2019, it was consistent.   Pain Inventory Average Pain 9 Pain Right Now 9 My pain is constant and aching  In the last 24 hours, has pain interfered with the following? General activity 9 Relation with others 8 Enjoyment of life 9 What TIME of day is your pain at its worst? daytime and evening Sleep (in general) Fair  Pain is worse with: bending and some activites Pain improves with: rest and medication Relief from Meds: na  Mobility ability to climb steps?  yes do you drive?  yes  Function retired  Neuro/Psych numbness depression  Prior Studies Any changes since last visit?  no  Physicians involved in your care Any changes since last visit?  no   Family History  Problem Relation Age of Onset  . Hypertension Mother   . Diabetes Mother   . Stroke Mother        multiple  . Hypertension Father   . Stroke Father   . Stroke Sister   . Stroke Daughter    Social History   Socioeconomic History  . Marital status: Married    Spouse name: Not on file  . Number of children: Not on file  . Years of education: Not on file  . Highest education level: Not on file  Occupational History  . Not on file  Tobacco Use  . Smoking status: Never Smoker  . Smokeless tobacco: Never Used  Substance and Sexual Activity  . Alcohol use: No    Alcohol/week: 0.0 standard drinks  . Drug use: No  . Sexual activity: Not on file  Other Topics Concern  . Not on file  Social History Narrative  . Not on file   Social Determinants of Health    Financial Resource Strain:   . Difficulty of Paying Living Expenses:   Food Insecurity:   . Worried About Charity fundraiser in the Last Year:   . Arboriculturist in the Last Year:   Transportation Needs:   . Film/video editor (Medical):   Marland Kitchen Lack of Transportation (Non-Medical):   Physical Activity:   . Days of Exercise per Week:   . Minutes of Exercise per Session:   Stress:   . Feeling of Stress :   Social Connections:   . Frequency of Communication with Friends and Family:   . Frequency of Social Gatherings with Friends and Family:   . Attends Religious Services:   . Active Member of Clubs or Organizations:   . Attends Archivist Meetings:   Marland Kitchen Marital Status:    Past Surgical History:  Procedure Laterality Date  . CARDIAC SURGERY  2003   stent  . IMPLANTATION VAGAL NERVE STIMULATOR    . TRIGGER FINGER RELEASE    . TUMOR REMOVAL  2002   S1 tumor removal by Dr. Annette Stable  . VAGAL NERVE STIMULATOR REMOVAL     Past Medical History:  Diagnosis Date  . Arthritis   . Diabetes mellitus   .  High blood pressure   . Plantar fasciitis    BP 101/61   Pulse 65   Temp 97.7 F (36.5 C)   Ht 5\' 7"  (1.702 m)   Wt 160 lb 3.2 oz (72.7 kg)   SpO2 96%   BMI 25.09 kg/m   Opioid Risk Score:   Fall Risk Score:  `1  Depression screen PHQ 2/9  Depression screen [facility] 2/9 07/24/2019 06/25/2019 02/24/2019 01/29/2018 12/31/2017 11/28/2017 08/27/2017  Decreased Interest 1 1 1  0 0 0 0  Down, Depressed, Hopeless 1 1 1 1 1  0 0  PHQ - 2 Score 2 2 2 1 1  0 0  Altered sleeping - - - - - - -  Tired, decreased energy - - - - - - -  Change in appetite - - - - - - -  Feeling bad or failure about yourself  - - - - - - -  Trouble concentrating - - - - - - -  Moving slowly or fidgety/restless - - - - - - -  Suicidal thoughts - - - - - - -  PHQ-9 Score - - - - - - -    Review of Systems  Constitutional: Negative.   HENT: Negative.   Eyes: Negative.   Respiratory: Positive for apnea.    Cardiovascular: Negative.   Gastrointestinal: Negative.   Endocrine:       High blood sugars  Genitourinary: Positive for difficulty urinating.  Musculoskeletal: Negative.   Skin: Negative.   Allergic/Immunologic: Negative.   Neurological: Positive for numbness.  Hematological: Negative.   Psychiatric/Behavioral: Positive for dysphoric mood.  All other systems reviewed and are negative.      Objective:   Physical Exam Vitals and nursing note reviewed.  Constitutional:      Appearance: Normal appearance.  Cardiovascular:     Rate and Rhythm: Normal rate and regular rhythm.     Pulses: Normal pulses.     Heart sounds: Normal heart sounds.  Pulmonary:     Effort: Pulmonary effort is normal.     Breath sounds: Normal breath sounds.  Musculoskeletal:     Cervical back: Normal range of motion and neck supple.     Comments: Normal Muscle Bulk and Muscle Testing Reveals:  Upper Extremities: Full ROM and Muscle Strength 5/5  Lower Extremities: Full ROM and Muscle Strength 5/5 Right Lower Extremity Flexion Produces Pain into his right foot Arises from chair with ease Narrow Based Gait   Skin:    General: Skin is warm and dry.  Neurological:     Mental Status: He is alert and oriented to person, place, and time.  Psychiatric:        Mood and Affect: Mood normal.        Behavior: Behavior normal.           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  We will continue the opioid monitoring program, this consists of regular clinic visits, examinations, urine drug screen, pill counts as well as use of [place] Controlled Substance Reporting System. 2. Diabetic peripheral neuropathy affecting both feet: Continue current medication regimen with Gabapentin PCP Following. 07/24/2019 3. Chronic Low Back Pain:No complaints  today.Continue current medication regimen. Continue HEP as Tolerated. Continue to Monitor.07/24/2019  F/U in 1 month  92minutes of face to face patient care time was spent during this visit. All  questions were encouraged and answered.

## 2019-08-26 ENCOUNTER — Encounter: Payer: Self-pay | Admitting: Registered Nurse

## 2019-08-26 ENCOUNTER — Other Ambulatory Visit: Payer: Self-pay

## 2019-08-26 ENCOUNTER — Encounter: Payer: Medicare Other | Attending: Physical Medicine & Rehabilitation | Admitting: Registered Nurse

## 2019-08-26 VITALS — BP 155/64 | HR 66 | Temp 98.7°F | Ht 67.0 in | Wt 163.0 lb

## 2019-08-26 DIAGNOSIS — Z760 Encounter for issue of repeat prescription: Secondary | ICD-10-CM | POA: Insufficient documentation

## 2019-08-26 DIAGNOSIS — E1140 Type 2 diabetes mellitus with diabetic neuropathy, unspecified: Secondary | ICD-10-CM | POA: Diagnosis not present

## 2019-08-26 DIAGNOSIS — M5418 Radiculopathy, sacral and sacrococcygeal region: Secondary | ICD-10-CM | POA: Insufficient documentation

## 2019-08-26 DIAGNOSIS — E1149 Type 2 diabetes mellitus with other diabetic neurological complication: Secondary | ICD-10-CM

## 2019-08-26 DIAGNOSIS — Z86018 Personal history of other benign neoplasm: Secondary | ICD-10-CM

## 2019-08-26 DIAGNOSIS — Z79891 Long term (current) use of opiate analgesic: Secondary | ICD-10-CM

## 2019-08-26 DIAGNOSIS — G8929 Other chronic pain: Secondary | ICD-10-CM | POA: Diagnosis present

## 2019-08-26 DIAGNOSIS — G894 Chronic pain syndrome: Secondary | ICD-10-CM

## 2019-08-26 DIAGNOSIS — E1342 Other specified diabetes mellitus with diabetic polyneuropathy: Secondary | ICD-10-CM | POA: Insufficient documentation

## 2019-08-26 DIAGNOSIS — D3610 Benign neoplasm of peripheral nerves and autonomic nervous system, unspecified: Secondary | ICD-10-CM | POA: Diagnosis not present

## 2019-08-26 DIAGNOSIS — Z5181 Encounter for therapeutic drug level monitoring: Secondary | ICD-10-CM

## 2019-08-26 DIAGNOSIS — M79671 Pain in right foot: Secondary | ICD-10-CM | POA: Diagnosis present

## 2019-08-26 MED ORDER — OXYCODONE HCL 15 MG PO TABS: 15.0000 mg | ORAL_TABLET | Freq: Three times a day (TID) | ORAL | 0 refills | Status: DC | PRN

## 2019-08-26 NOTE — Progress Notes (Signed)
Subjective:    Patient ID: Troy [name], male    DOB: 07-18-1945, 74 y.o.   MRN: [id number]  HPI: Troy [name] is a 74 y.o. male who returns for follow up appointment for chronic pain and medication refill. He states his pain is located in his bilateral feet with tingling and burning. He rates his pain 9. His current exercise regime is walking and yard work.   [name] Morphine equivalent is 67.50 MME.  Last UDS was Performed on 05/26/2019, it was consistent.    Pain Inventory Average Pain 9 Pain Right Now 9 My pain is constant, burning and aching  In the last 24 hours, has pain interfered with the following? General activity 8 Relation with others 8 Enjoyment of life 9 What TIME of day is your pain at its worst? morning, daytime, evenoing Sleep (in general) Good  Pain is worse with: walking, inactivity and some activites Pain improves with: rest and medication Relief from Meds: 8  Mobility walk without assistance ability to climb steps?  yes do you drive?  yes  Function retired  Neuro/Psych bladder control problems depression  Prior Studies Any changes since last visit?  no  Physicians involved in your care Any changes since last visit?  no   Family History  Problem Relation Age of Onset  . Hypertension Mother   . Diabetes Mother   . Stroke Mother        multiple  . Hypertension Father   . Stroke Father   . Stroke Sister   . Stroke Daughter    Social History   Socioeconomic History  . Marital status: Married    Spouse name: Not on file  . Number of children: Not on file  . Years of education: Not on file  . Highest education level: Not on file  Occupational History  . Not on file  Tobacco Use  . Smoking status: Never Smoker  . Smokeless tobacco: Never Used  Substance and Sexual Activity  . Alcohol use: No    Alcohol/week: 0.0 standard drinks  . Drug use: No  . Sexual activity: Not on file  Other Topics Concern  . Not on file  Social  History Narrative  . Not on file   Social Determinants of Health   Financial Resource Strain:   . Difficulty of Paying Living Expenses:   Food Insecurity:   . Worried About Charity fundraiser in the Last Year:   . Arboriculturist in the Last Year:   Transportation Needs:   . Film/video editor (Medical):   Marland Kitchen Lack of Transportation (Non-Medical):   Physical Activity:   . Days of Exercise per Week:   . Minutes of Exercise per Session:   Stress:   . Feeling of Stress :   Social Connections:   . Frequency of Communication with Friends and Family:   . Frequency of Social Gatherings with Friends and Family:   . Attends Religious Services:   . Active Member of Clubs or Organizations:   . Attends Archivist Meetings:   Marland Kitchen Marital Status:    Past Surgical History:  Procedure Laterality Date  . CARDIAC SURGERY  2003   stent  . IMPLANTATION VAGAL NERVE STIMULATOR    . TRIGGER FINGER RELEASE    . TUMOR REMOVAL  2002   S1 tumor removal by Dr. Annette Stable  . VAGAL NERVE STIMULATOR REMOVAL     Past Medical History:  Diagnosis Date  .  Arthritis   . Diabetes mellitus   . High blood pressure   . Plantar fasciitis    BP (!) 155/64   Pulse 66   Temp 98.7 F (37.1 C)   Ht 5\' 7"  (1.702 m)   Wt 163 lb (73.9 kg)   SpO2 97%   BMI 25.53 kg/m   Opioid Risk Score:   Fall Risk Score:  `1  Depression screen PHQ 2/9  Depression screen [facility] 2/9 07/24/2019 06/25/2019 02/24/2019 01/29/2018 12/31/2017 11/28/2017 08/27/2017  Decreased Interest 1 1 1  0 0 0 0  Down, Depressed, Hopeless 1 1 1 1 1  0 0  PHQ - 2 Score 2 2 2 1 1  0 0  Altered sleeping - - - - - - -  Tired, decreased energy - - - - - - -  Change in appetite - - - - - - -  Feeling bad or failure about yourself  - - - - - - -  Trouble concentrating - - - - - - -  Moving slowly or fidgety/restless - - - - - - -  Suicidal thoughts - - - - - - -  PHQ-9 Score - - - - - - -    Review of Systems  Constitutional: Negative.   HENT:  Negative.   Eyes: Negative.   Respiratory: Negative.   Cardiovascular: Negative.   Gastrointestinal: Negative.   Endocrine: Negative.   Genitourinary: Positive for difficulty urinating.  Musculoskeletal: Positive for gait problem.  Skin: Negative.   Allergic/Immunologic: Negative.   Hematological: Negative.   Psychiatric/Behavioral: Positive for dysphoric mood.  All other systems reviewed and are negative.      Objective:   Physical Exam Vitals and nursing note reviewed.  Constitutional:      Appearance: Normal appearance.  Cardiovascular:     Rate and Rhythm: Normal rate and regular rhythm.     Pulses: Normal pulses.     Heart sounds: Normal heart sounds.  Pulmonary:     Effort: Pulmonary effort is normal.     Breath sounds: Normal breath sounds.  Musculoskeletal:     Cervical back: Normal range of motion and neck supple.     Comments: Normal Muscle Bulk and Muscle Testing Reveals:  Upper Extremities: Full ROM and Muscle Strength 5/5  Lower Extremities: Full ROM and Muscle Strength 5/5 Bilateral Lower Extremities Flexion Produces Pain into his bilateral feet Arises from chair with ease Narrow Based Gait   Skin:    General: Skin is warm and dry.  Neurological:     Mental Status: He is alert and oriented to person, place, and time.  Psychiatric:        Mood and Affect: Mood normal.        Behavior: Behavior normal.           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  We will continue the opioid monitoring program, this consists of regular clinic visits, examinations, urine drug screen, pill counts as well as use of [place] Controlled Substance Reporting System. 2. Diabetic peripheral neuropathy affecting both feet: Continue current medication regimen with Gabapentin PCP Following. 08/26/2019 3. Chronic Low  Back Pain:No complaints today.Continue current medication regimen. Continue HEP as Tolerated. Continue to Monitor.08/26/2019  F/U in 1 month  22minutes of face to face patient care time was spent during this visit. All questions were encouraged and  answered.

## 2019-09-23 ENCOUNTER — Encounter: Payer: Medicare Other | Admitting: Registered Nurse

## 2019-09-24 ENCOUNTER — Encounter: Payer: Medicare Other | Attending: Physical Medicine & Rehabilitation | Admitting: Registered Nurse

## 2019-09-24 ENCOUNTER — Encounter: Payer: Self-pay | Admitting: Registered Nurse

## 2019-09-24 ENCOUNTER — Other Ambulatory Visit: Payer: Self-pay

## 2019-09-24 VITALS — BP 153/74 | HR 63 | Temp 98.1°F | Ht 67.0 in | Wt 166.0 lb

## 2019-09-24 DIAGNOSIS — E1140 Type 2 diabetes mellitus with diabetic neuropathy, unspecified: Secondary | ICD-10-CM | POA: Diagnosis not present

## 2019-09-24 DIAGNOSIS — Z760 Encounter for issue of repeat prescription: Secondary | ICD-10-CM | POA: Diagnosis not present

## 2019-09-24 DIAGNOSIS — G894 Chronic pain syndrome: Secondary | ICD-10-CM

## 2019-09-24 DIAGNOSIS — M5418 Radiculopathy, sacral and sacrococcygeal region: Secondary | ICD-10-CM | POA: Diagnosis not present

## 2019-09-24 DIAGNOSIS — Z86018 Personal history of other benign neoplasm: Secondary | ICD-10-CM | POA: Diagnosis not present

## 2019-09-24 DIAGNOSIS — E1342 Other specified diabetes mellitus with diabetic polyneuropathy: Secondary | ICD-10-CM | POA: Diagnosis not present

## 2019-09-24 DIAGNOSIS — M79671 Pain in right foot: Secondary | ICD-10-CM | POA: Diagnosis present

## 2019-09-24 DIAGNOSIS — Z79891 Long term (current) use of opiate analgesic: Secondary | ICD-10-CM | POA: Diagnosis present

## 2019-09-24 DIAGNOSIS — D3610 Benign neoplasm of peripheral nerves and autonomic nervous system, unspecified: Secondary | ICD-10-CM | POA: Diagnosis not present

## 2019-09-24 DIAGNOSIS — E1149 Type 2 diabetes mellitus with other diabetic neurological complication: Secondary | ICD-10-CM

## 2019-09-24 DIAGNOSIS — G8929 Other chronic pain: Secondary | ICD-10-CM | POA: Diagnosis present

## 2019-09-24 DIAGNOSIS — Z5181 Encounter for therapeutic drug level monitoring: Secondary | ICD-10-CM

## 2019-09-24 MED ORDER — GABAPENTIN 400 MG PO CAPS: 800.0000 mg | ORAL_CAPSULE | Freq: Four times a day (QID) | ORAL | 1 refills | Status: DC

## 2019-09-24 MED ORDER — OXYCODONE HCL 15 MG PO TABS: 15.0000 mg | ORAL_TABLET | Freq: Three times a day (TID) | ORAL | 0 refills | Status: DC | PRN

## 2019-09-24 NOTE — Progress Notes (Signed)
Subjective:    Patient ID: Troy [name], male    DOB: 22-Apr-1945, 74 y.o.   MRN: [id number]  HPI: Troy [name] is a 74 y.o. male who returns for follow up appointment for chronic pain and medication refill. He states his pain is located in his right foot with numbness and tingling. He rates his pain 8. His  current exercise regime is walking, performing stretching exercises and outside chores.   [name] asked this provider to fill his gabapentin, he states he went to PCP office yesterday, they were ill and he only has 5 capsules left. Gabapentin e- scribed today, he verbalizes understanding.   [name] Morphine equivalent is 67.50  MME.    Last UDS was Performed on 05/26/2019, it was consistent.    Pain Inventory Average Pain 9 Pain Right Now 8 My pain is aching  In the last 24 hours, has pain interfered with the following? General activity 8 Relation with others 8 Enjoyment of life 8 What TIME of day is your pain at its worst? morning, daytime, evening Sleep (in general) Good  Pain is worse with: walking and bending Pain improves with: rest and medication Relief from Meds: 8  Mobility walk without assistance  Function retired  Neuro/Psych No problems in this area  Prior Studies Any changes since last visit?  no  Physicians involved in your care Any changes since last visit?  no   Family History  Problem Relation Age of Onset  . Hypertension Mother   . Diabetes Mother   . Stroke Mother        multiple  . Hypertension Father   . Stroke Father   . Stroke Sister   . Stroke Daughter    Social History   Socioeconomic History  . Marital status: Married    Spouse name: Not on file  . Number of children: Not on file  . Years of education: Not on file  . Highest education level: Not on file  Occupational History  . Not on file  Tobacco Use  . Smoking status: Never Smoker  . Smokeless tobacco: Never Used  Substance and Sexual Activity  . Alcohol use:  No    Alcohol/week: 0.0 standard drinks  . Drug use: No  . Sexual activity: Not on file  Other Topics Concern  . Not on file  Social History Narrative  . Not on file   Social Determinants of Health   Financial Resource Strain:   . Difficulty of Paying Living Expenses:   Food Insecurity:   . Worried About Charity fundraiser in the Last Year:   . Arboriculturist in the Last Year:   Transportation Needs:   . Film/video editor (Medical):   Marland Kitchen Lack of Transportation (Non-Medical):   Physical Activity:   . Days of Exercise per Week:   . Minutes of Exercise per Session:   Stress:   . Feeling of Stress :   Social Connections:   . Frequency of Communication with Friends and Family:   . Frequency of Social Gatherings with Friends and Family:   . Attends Religious Services:   . Active Member of Clubs or Organizations:   . Attends Archivist Meetings:   Marland Kitchen Marital Status:    Past Surgical History:  Procedure Laterality Date  . CARDIAC SURGERY  2003   stent  . IMPLANTATION VAGAL NERVE STIMULATOR    . TRIGGER FINGER RELEASE    . TUMOR REMOVAL  2002   S1 tumor removal by Dr. Annette Stable  . VAGAL NERVE STIMULATOR REMOVAL     Past Medical History:  Diagnosis Date  . Arthritis   . Diabetes mellitus   . High blood pressure   . Plantar fasciitis    BP (!) 169/53   Pulse 63   Temp 98.1 F (36.7 C)   Ht 5\' 7"  (1.702 m)   Wt 166 lb (75.3 kg)   SpO2 98%   BMI 26.00 kg/m   Opioid Risk Score:   Fall Risk Score:  `1  Depression screen PHQ 2/9  Depression screen [facility] 2/9 07/24/2019 06/25/2019 02/24/2019 01/29/2018 12/31/2017 11/28/2017 08/27/2017  Decreased Interest 1 1 1  0 0 0 0  Down, Depressed, Hopeless 1 1 1 1 1  0 0  PHQ - 2 Score 2 2 2 1 1  0 0  Altered sleeping - - - - - - -  Tired, decreased energy - - - - - - -  Change in appetite - - - - - - -  Feeling bad or failure about yourself  - - - - - - -  Trouble concentrating - - - - - - -  Moving slowly or fidgety/restless  - - - - - - -  Suicidal thoughts - - - - - - -  PHQ-9 Score - - - - - - -    Review of Systems  Constitutional: Negative.   HENT: Negative.   Eyes: Negative.   Respiratory: Negative.   Cardiovascular: Negative.   Gastrointestinal: Negative.   Endocrine: Negative.   Genitourinary: Negative.   Musculoskeletal: Negative.   Skin: Negative.   Allergic/Immunologic: Negative.   Neurological: Negative.   Hematological: Negative.   Psychiatric/Behavioral: Negative.   All other systems reviewed and are negative.      Objective:   Physical Exam Vitals and nursing note reviewed.  Constitutional:      Appearance: Normal appearance.  Cardiovascular:     Rate and Rhythm: Normal rate and regular rhythm.     Pulses: Normal pulses.     Heart sounds: Normal heart sounds.  Pulmonary:     Effort: Pulmonary effort is normal.     Breath sounds: Normal breath sounds.  Musculoskeletal:     Cervical back: Normal range of motion and neck supple.     Comments: Normal Muscle Bulk and Muscle Testing Reveals:  Upper Extremities: Full ROM and Muscle Strength 5/5 Lower Extremities: Full ROM and Muscle Strength 5/5 Right Lower Extremity Flexion Produces Pain into his right Foot Arises from chair with ease Narrow Based  Gait   Skin:    General: Skin is warm and dry.  Neurological:     Mental Status: He is alert and oriented to person, place, and time.  Psychiatric:        Mood and Affect: Mood normal.        Behavior: Behavior normal.           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  We will continue the opioid monitoring program, this consists of regular clinic visits, examinations, urine drug screen, pill counts as well as use of [place] Controlled Substance Reporting System. 2. Diabetic peripheral neuropathy affecting both feet:  Continue current medication regimen with Gabapentin PCP Following. 09/24/2019 3. Chronic Low Back Pain:No complaints today.Continue current medication regimen. Continue HEP as Tolerated. Continue to  Monitor.09/24/2019  F/U in 1 month  64minutes of face to face patient care time was spent during this visit. All questions were encouraged and answered.

## 2019-10-22 ENCOUNTER — Other Ambulatory Visit: Payer: Self-pay

## 2019-10-22 ENCOUNTER — Encounter: Payer: Self-pay | Admitting: Registered Nurse

## 2019-10-22 ENCOUNTER — Encounter: Payer: Medicare Other | Attending: Physical Medicine & Rehabilitation | Admitting: Registered Nurse

## 2019-10-22 VITALS — BP 124/67 | HR 64 | Temp 98.5°F | Ht 67.0 in | Wt 167.8 lb

## 2019-10-22 DIAGNOSIS — E1342 Other specified diabetes mellitus with diabetic polyneuropathy: Secondary | ICD-10-CM | POA: Diagnosis not present

## 2019-10-22 DIAGNOSIS — Z79891 Long term (current) use of opiate analgesic: Secondary | ICD-10-CM

## 2019-10-22 DIAGNOSIS — D3610 Benign neoplasm of peripheral nerves and autonomic nervous system, unspecified: Secondary | ICD-10-CM | POA: Diagnosis not present

## 2019-10-22 DIAGNOSIS — Z86018 Personal history of other benign neoplasm: Secondary | ICD-10-CM | POA: Diagnosis not present

## 2019-10-22 DIAGNOSIS — G8929 Other chronic pain: Secondary | ICD-10-CM | POA: Insufficient documentation

## 2019-10-22 DIAGNOSIS — Z5181 Encounter for therapeutic drug level monitoring: Secondary | ICD-10-CM

## 2019-10-22 DIAGNOSIS — E1140 Type 2 diabetes mellitus with diabetic neuropathy, unspecified: Secondary | ICD-10-CM

## 2019-10-22 DIAGNOSIS — E1149 Type 2 diabetes mellitus with other diabetic neurological complication: Secondary | ICD-10-CM

## 2019-10-22 DIAGNOSIS — M5418 Radiculopathy, sacral and sacrococcygeal region: Secondary | ICD-10-CM | POA: Insufficient documentation

## 2019-10-22 DIAGNOSIS — Z760 Encounter for issue of repeat prescription: Secondary | ICD-10-CM | POA: Diagnosis not present

## 2019-10-22 DIAGNOSIS — G894 Chronic pain syndrome: Secondary | ICD-10-CM | POA: Diagnosis not present

## 2019-10-22 DIAGNOSIS — M79671 Pain in right foot: Secondary | ICD-10-CM | POA: Insufficient documentation

## 2019-10-22 MED ORDER — OXYCODONE HCL 15 MG PO TABS: 15.0000 mg | ORAL_TABLET | Freq: Three times a day (TID) | ORAL | 0 refills | Status: DC | PRN

## 2019-10-22 NOTE — Progress Notes (Signed)
Subjective:    Patient ID: Troy [name], male    DOB: 1945/07/12, 74 y.o.   MRN: [id number]  HPI: Troy [name] is a 74 y.o. male who returns for follow up appointment for chronic pain and medication refill. He states his pain is located in his right foot with numbness and tingling. He rates his pain 9. His  current exercise regime is walking and performing outside chores.   Troy [name] equivalent is 67.50 MME.  UDS ordered today.   Pain Inventory Average Pain 8 Pain Right Now 9 My pain is constant and aching  In the last 24 hours, has pain interfered with the following? General activity 8 Relation with others 8 Enjoyment of life 8 What TIME of day is your pain at its worst? daytime and night Sleep (in general) Good  Pain is worse with: walking, bending and some activites Pain improves with: rest and medication Relief from Meds: 8  Family History  Problem Relation Age of Onset   Hypertension Mother    Diabetes Mother    Stroke Mother        multiple   Hypertension Father    Stroke Father    Stroke Sister    Stroke Daughter    Social History   Socioeconomic History   Marital status: Married    Spouse name: Not on file   Number of children: Not on file   Years of education: Not on file   Highest education level: Not on file  Occupational History   Not on file  Tobacco Use   Smoking status: Never Smoker   Smokeless tobacco: Never Used  Substance and Sexual Activity   Alcohol use: No    Alcohol/week: 0.0 standard drinks   Drug use: No   Sexual activity: Not on file  Other Topics Concern   Not on file  Social History Narrative   Not on file   Social Determinants of Health   Financial Resource Strain:    Difficulty of Paying Living Expenses: Not on file  Food Insecurity:    Worried About Charity fundraiser in the Last Year: Not on file   [employer] of Food in the Last Year: Not on file  Transportation Needs:    Lack of  Transportation (Medical): Not on file   Lack of Transportation (Non-Medical): Not on file  Physical Activity:    Days of Exercise per Week: Not on file   Minutes of Exercise per Session: Not on file  Stress:    Feeling of Stress : Not on file  Social Connections:    Frequency of Communication with Friends and Family: Not on file   Frequency of Social Gatherings with Friends and Family: Not on file   Attends Religious Services: Not on file   Active Member of Clubs or Organizations: Not on file   Attends Archivist Meetings: Not on file   Marital Status: Not on file   Past Surgical History:  Procedure Laterality Date   CARDIAC SURGERY  2003   stent   [place]   S1 tumor removal by Dr. Annette Stable   VAGAL NERVE STIMULATOR REMOVAL     Past Surgical History:  Procedure Laterality Date   CARDIAC SURGERY  2003   stent   [place]  2002   S1 tumor removal by Dr. Annette Stable   VAGAL NERVE STIMULATOR REMOVAL     Past Medical History:  Diagnosis Date   Arthritis    Diabetes mellitus    High blood pressure    Plantar fasciitis    BP 124/67    Pulse 64    Temp 98.5 F (36.9 C)    Ht 5\' 7"  (1.702 m)    Wt 167 lb 12.8 oz (76.1 kg)    SpO2 95%    BMI 26.28 kg/m   Opioid Risk Score:   Fall Risk Score:  `1  Depression screen PHQ 2/9  Depression screen [facility] 2/9 10/22/2019 07/24/2019 06/25/2019 02/24/2019 01/29/2018 12/31/2017 11/28/2017  Decreased Interest 0 1 1 1  0 0 0  Down, Depressed, Hopeless 0 1 1 1 1 1  0  PHQ - 2 Score 0 2 2 2 1 1  0  Altered sleeping - - - - - - -  Tired, decreased energy - - - - - - -  Change in appetite - - - - - - -  Feeling bad or failure about yourself  - - - - - - -  Trouble concentrating - - - - - - -  Moving slowly or fidgety/restless - - - - - - -  Suicidal thoughts - - - - - - -  PHQ-9  Score - - - - - - -    Review of Systems  HENT: Positive for hearing loss.   Musculoskeletal: Positive for gait problem.       Foot pain  Neurological: Positive for numbness.  All other systems reviewed and are negative.      Objective:   Physical Exam Vitals and nursing note reviewed.  Constitutional:      Appearance: Normal appearance.  Cardiovascular:     Rate and Rhythm: Normal rate and regular rhythm.     Pulses: Normal pulses.     Heart sounds: Normal heart sounds.  Pulmonary:     Effort: Pulmonary effort is normal.     Breath sounds: Normal breath sounds.  Musculoskeletal:     Cervical back: Normal range of motion and neck supple.     Comments: Normal Muscle Bulk and Muscle Testing Reveals:  Upper Extremities: Full ROM and Muscle Strength 5/5 Lower Extremities: Full ROM and Muscle Strength 5/5 Arises from Chair with ease Narrow Based  Gait   Skin:    General: Skin is warm and dry.  Neurological:     Mental Status: He is alert and oriented to person, place, and time.  Psychiatric:        Mood and Affect: Mood normal.        Behavior: Behavior normal.           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  We will continue the opioid monitoring program, this consists of regular clinic visits, examinations, urine drug screen, pill counts as well as use of [place] Controlled Substance Reporting system. A 12 month History has been reviewed on the [place] Controlled Substance Reporting System on 10/22/2019.  2. Diabetic peripheral neuropathy affecting both feet: Continue current medication regimen with Gabapentin PCP Following. 10/22/2019 3. Chronic Low Back Pain:No complaints today.Continue current medication regimen. Continue HEP as Tolerated. Continue to Monitor.10/22/2019  F/U in 1  month  39minutes of face to face patient care time  was spent during this visit. All questions were encouraged and answered.

## 2019-10-24 LAB — TOXASSURE SELECT,+ANTIDEPR,UR

## 2019-10-29 ENCOUNTER — Telehealth: Payer: Self-pay | Admitting: *Deleted

## 2019-10-29 NOTE — Telephone Encounter (Signed)
Urine drug screen for this encounter is consistent for prescribed medication 

## 2019-11-21 ENCOUNTER — Other Ambulatory Visit: Payer: Self-pay

## 2019-11-21 ENCOUNTER — Encounter: Payer: Medicare Other | Attending: Physical Medicine & Rehabilitation | Admitting: Registered Nurse

## 2019-11-21 ENCOUNTER — Encounter: Payer: Self-pay | Admitting: Registered Nurse

## 2019-11-21 VITALS — BP 137/64 | HR 63 | Temp 98.1°F | Ht 67.0 in | Wt 166.0 lb

## 2019-11-21 DIAGNOSIS — Z79891 Long term (current) use of opiate analgesic: Secondary | ICD-10-CM

## 2019-11-21 DIAGNOSIS — E1149 Type 2 diabetes mellitus with other diabetic neurological complication: Secondary | ICD-10-CM

## 2019-11-21 DIAGNOSIS — Z5181 Encounter for therapeutic drug level monitoring: Secondary | ICD-10-CM | POA: Diagnosis not present

## 2019-11-21 DIAGNOSIS — E1140 Type 2 diabetes mellitus with diabetic neuropathy, unspecified: Secondary | ICD-10-CM | POA: Diagnosis not present

## 2019-11-21 DIAGNOSIS — G894 Chronic pain syndrome: Secondary | ICD-10-CM | POA: Diagnosis not present

## 2019-11-21 DIAGNOSIS — Z86018 Personal history of other benign neoplasm: Secondary | ICD-10-CM | POA: Insufficient documentation

## 2019-11-21 MED ORDER — OXYCODONE HCL 15 MG PO TABS: 15.0000 mg | ORAL_TABLET | Freq: Three times a day (TID) | ORAL | 0 refills | Status: DC | PRN

## 2019-11-21 NOTE — Progress Notes (Signed)
Subjective:    Patient ID: Troy [name], male    DOB: 1945-12-14, 74 y.o.   MRN: [id number]  HPI: Troy [name] is a 73 y.o. male who returns for follow up appointment for chronic pain and medication refill. He states his pain is located in his right foot. He rates his pain 8. His current exercise regime is walking and performing outside chores.   [name] Morphine equivalent is 67.50 MME.  Last UDS was Performed on 10/22/2018, it was consistent.    Pain Inventory Average Pain 8 Pain Right Now 8 My pain is tingling, aching and numb  In the last 24 hours, has pain interfered with the following? General activity 8 Relation with others 8 Enjoyment of life 7 What TIME of day is your pain at its worst? daytime and evening Sleep (in general) Fair  Pain is worse with: walking, bending and some activites Pain improves with: rest and medication Relief from Meds: 8  Family History  Problem Relation Age of Onset   Hypertension Mother    Diabetes Mother    Stroke Mother        multiple   Hypertension Father    Stroke Father    Stroke Sister    Stroke Daughter    Social History   Socioeconomic History   Marital status: Married    Spouse name: Not on file   Number of children: Not on file   Years of education: Not on file   Highest education level: Not on file  Occupational History   Not on file  Tobacco Use   Smoking status: Never Smoker   Smokeless tobacco: Never Used  Substance and Sexual Activity   Alcohol use: No    Alcohol/week: 0.0 standard drinks   Drug use: No   Sexual activity: Not on file  Other Topics Concern   Not on file  Social History Narrative   Not on file   Social Determinants of Health   Financial Resource Strain:    Difficulty of Paying Living Expenses: Not on file  Food Insecurity:    Worried About Charity fundraiser in the Last Year: Not on file   [employer] of Food in the Last Year: Not on file  Transportation Needs:     Lack of Transportation (Medical): Not on file   Lack of Transportation (Non-Medical): Not on file  Physical Activity:    Days of Exercise per Week: Not on file   Minutes of Exercise per Session: Not on file  Stress:    Feeling of Stress : Not on file  Social Connections:    Frequency of Communication with Friends and Family: Not on file   Frequency of Social Gatherings with Friends and Family: Not on file   Attends Religious Services: Not on file   Active Member of Clubs or Organizations: Not on file   Attends Archivist Meetings: Not on file   Marital Status: Not on file   Past Surgical History:  Procedure Laterality Date   CARDIAC SURGERY  2003   stent   [place]   S1 tumor removal by Dr. Annette Stable   VAGAL NERVE STIMULATOR REMOVAL     Past Surgical History:  Procedure Laterality Date   CARDIAC SURGERY  2003   stent   [place]  TUMOR REMOVAL  2002   S1 tumor removal by Dr. Annette Stable   VAGAL NERVE STIMULATOR REMOVAL     Past Medical History:  Diagnosis Date   Arthritis    Diabetes mellitus    High blood pressure    Plantar fasciitis    Temp 98.1 F (36.7 C)    Ht 5\' 7"  (1.702 m)    Wt 166 lb (75.3 kg)    BMI 26.00 kg/m   Opioid Risk Score:   Fall Risk Score:  `1  Depression screen PHQ 2/9  Depression screen [facility] 2/9 10/22/2019 07/24/2019 06/25/2019 02/24/2019 01/29/2018 12/31/2017 11/28/2017  Decreased Interest 0 1 1 1  0 0 0  Down, Depressed, Hopeless 0 1 1 1 1 1  0  PHQ - 2 Score 0 2 2 2 1 1  0  Altered sleeping - - - - - - -  Tired, decreased energy - - - - - - -  Change in appetite - - - - - - -  Feeling bad or failure about yourself  - - - - - - -  Trouble concentrating - - - - - - -  Moving slowly or fidgety/restless - - - - - - -  Suicidal thoughts - - - - - - -  PHQ-9 Score - - - - - - -    Review  of Systems  Constitutional: Negative.   HENT: Negative.   Eyes: Negative.   Respiratory: Negative.   Cardiovascular: Negative.   Gastrointestinal: Negative.   Endocrine: Negative.   Genitourinary: Negative.   Musculoskeletal:       Pain in the feet   Skin: Negative.   Allergic/Immunologic: Negative.   Neurological: Positive for numbness.       Tingling  Hematological: Negative.   Psychiatric/Behavioral: Negative.   All other systems reviewed and are negative.      Objective:   Physical Exam Vitals and nursing note reviewed.  Constitutional:      Appearance: Normal appearance.  Cardiovascular:     Rate and Rhythm: Normal rate and regular rhythm.     Pulses: Normal pulses.     Heart sounds: Normal heart sounds.  Pulmonary:     Effort: Pulmonary effort is normal.     Breath sounds: Normal breath sounds.  Musculoskeletal:     Cervical back: Normal range of motion and neck supple.     Comments: Normal Muscle Bulk and Muscle Testing Reveals:  Upper Extremities: Full ROM and Muscle Strength 5/5 Lower Extremities: Full ROM and Muscle Strength 5/5 Arises from chair with ease Narrow Based  Gait   Skin:    General: Skin is warm and dry.  Neurological:     Mental Status: He is alert and oriented to person, place, and time.  Psychiatric:        Mood and Affect: Mood normal.        Behavior: Behavior normal.           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  We will continue the opioid monitoring program, this consists of regular clinic visits, examinations, urine drug screen, pill counts as well as use of [place] Controlled Substance Reporting system. A 12 month History has been reviewed on the [place] Controlled Substance Reporting System on 11/21/2019.  2. Diabetic peripheral neuropathy affecting both  feet: Continue current medication regimen with Gabapentin PCP  Following. 11/21/2019 3. Chronic Low Back Pain:No complaints today.Continue current medication regimen. Continue HEP as Tolerated. Continue to Monitor.11/21/2019  F/U in 1 month  69minutes of face to face patient care time was spent during this visit. All questions were encouraged and answered.

## 2019-12-22 ENCOUNTER — Other Ambulatory Visit: Payer: Self-pay

## 2019-12-22 ENCOUNTER — Encounter: Payer: Self-pay | Admitting: Registered Nurse

## 2019-12-22 ENCOUNTER — Encounter: Payer: Medicare Other | Attending: Physical Medicine & Rehabilitation | Admitting: Registered Nurse

## 2019-12-22 VITALS — BP 156/66 | HR 62 | Temp 97.9°F | Ht 67.0 in | Wt 164.4 lb

## 2019-12-22 DIAGNOSIS — Z5181 Encounter for therapeutic drug level monitoring: Secondary | ICD-10-CM | POA: Insufficient documentation

## 2019-12-22 DIAGNOSIS — Z79891 Long term (current) use of opiate analgesic: Secondary | ICD-10-CM

## 2019-12-22 DIAGNOSIS — E1140 Type 2 diabetes mellitus with diabetic neuropathy, unspecified: Secondary | ICD-10-CM

## 2019-12-22 DIAGNOSIS — G894 Chronic pain syndrome: Secondary | ICD-10-CM | POA: Diagnosis not present

## 2019-12-22 DIAGNOSIS — E1149 Type 2 diabetes mellitus with other diabetic neurological complication: Secondary | ICD-10-CM | POA: Diagnosis present

## 2019-12-22 DIAGNOSIS — Z86018 Personal history of other benign neoplasm: Secondary | ICD-10-CM | POA: Diagnosis not present

## 2019-12-22 MED ORDER — OXYCODONE HCL 15 MG PO TABS
15.0000 mg | ORAL_TABLET | Freq: Three times a day (TID) | ORAL | 0 refills | Status: DC | PRN
Start: 2019-12-22 — End: 2020-01-23

## 2019-12-22 NOTE — Progress Notes (Signed)
Subjective:    Patient ID: Troy [name], male    DOB: 11-Nov-1945, 74 y.o.   MRN: [id number]  HPI: Troy [name] is a 74 y.o. male who returns for follow up appointment for chronic pain and medication refill. He states his pain is located in his right foot. He rates his pain 9. His current exercise regime is walking and performing light house work and light yard work.   Mr. Lebleu Morphine equivalent is 65.25 MME.  Last UDS was Performed on 10/22/2019, it was consistent.    Pain Inventory Average Pain 9 Pain Right Now 9 My pain is constant, aching and numbness  In the last 24 hours, has pain interfered with the following? General activity 8 Relation with others 8 Enjoyment of life 9 What TIME of day is your pain at its worst? morning , daytime and evening Sleep (in general) Good  Pain is worse with: walking and bending Pain improves with: rest and medication Relief from Meds: 8  Family History  Problem Relation Age of Onset  . Hypertension Mother   . Diabetes Mother   . Stroke Mother        multiple  . Hypertension Father   . Stroke Father   . Stroke Sister   . Stroke Daughter    Social History   Socioeconomic History  . Marital status: Married    Spouse name: Not on file  . Number of children: Not on file  . Years of education: Not on file  . Highest education level: Not on file  Occupational History  . Not on file  Tobacco Use  . Smoking status: Never Smoker  . Smokeless tobacco: Never Used  Substance and Sexual Activity  . Alcohol use: No    Alcohol/week: 0.0 standard drinks  . Drug use: No  . Sexual activity: Not on file  Other Topics Concern  . Not on file  Social History Narrative  . Not on file   Social Determinants of Health   Financial Resource Strain:   . Difficulty of Paying Living Expenses: Not on file  Food Insecurity:   . Worried About Charity fundraiser in the Last Year: Not on file  . Ran Out of Food in the Last Year: Not on file    Transportation Needs:   . Lack of Transportation (Medical): Not on file  . Lack of Transportation (Non-Medical): Not on file  Physical Activity:   . Days of Exercise per Week: Not on file  . Minutes of Exercise per Session: Not on file  Stress:   . Feeling of Stress : Not on file  Social Connections:   . Frequency of Communication with Friends and Family: Not on file  . Frequency of Social Gatherings with Friends and Family: Not on file  . Attends Religious Services: Not on file  . Active Member of Clubs or Organizations: Not on file  . Attends Archivist Meetings: Not on file  . Marital Status: Not on file   Past Surgical History:  Procedure Laterality Date  . CARDIAC SURGERY  2003   stent  . IMPLANTATION VAGAL NERVE STIMULATOR    . TRIGGER FINGER RELEASE    . TUMOR REMOVAL  2002   S1 tumor removal by Dr. Annette Stable  . VAGAL NERVE STIMULATOR REMOVAL     Past Surgical History:  Procedure Laterality Date  . CARDIAC SURGERY  2003   stent  . IMPLANTATION VAGAL NERVE STIMULATOR    .  TRIGGER FINGER RELEASE    . TUMOR REMOVAL  2002   S1 tumor removal by Dr. Annette Stable  . VAGAL NERVE STIMULATOR REMOVAL     Past Medical History:  Diagnosis Date  . Arthritis   . Diabetes mellitus   . High blood pressure   . Plantar fasciitis    There were no vitals taken for this visit.  Opioid Risk Score:   Fall Risk Score:  `1  Depression screen PHQ 2/9  Depression screen [facility] 2/9 10/22/2019 07/24/2019 06/25/2019 02/24/2019 01/29/2018 12/31/2017 11/28/2017  Decreased Interest 0 1 1 1  0 0 0  Down, Depressed, Hopeless 0 1 1 1 1 1  0  PHQ - 2 Score 0 2 2 2 1 1  0  Altered sleeping - - - - - - -  Tired, decreased energy - - - - - - -  Change in appetite - - - - - - -  Feeling bad or failure about yourself  - - - - - - -  Trouble concentrating - - - - - - -  Moving slowly or fidgety/restless - - - - - - -  Suicidal thoughts - - - - - - -  PHQ-9 Score - - - - - - -   Review of Systems   Constitutional: Negative.   HENT: Negative.   Eyes: Negative.   Respiratory: Negative.   Cardiovascular: Negative.   Gastrointestinal: Negative.   Endocrine: Negative.   Genitourinary: Negative.   Musculoskeletal: Negative.        Right numbness & Aching in  Right foot  Skin: Negative.   Allergic/Immunologic: Negative.   Neurological: Negative.   Hematological: Negative.   Psychiatric/Behavioral: Negative.   All other systems reviewed and are negative.      Objective:   Physical Exam Vitals and nursing note reviewed.  Constitutional:      Appearance: Normal appearance.  Cardiovascular:     Rate and Rhythm: Normal rate and regular rhythm.     Pulses: Normal pulses.     Heart sounds: Normal heart sounds.  Pulmonary:     Effort: Pulmonary effort is normal.     Breath sounds: Normal breath sounds.  Musculoskeletal:     Cervical back: Normal range of motion and neck supple.     Comments: Normal Muscle Bulk and Muscle Testing Reveals:  Upper Extremities: Full ROM and Muscle Strength 5/5  Lower Extremities: Full ROM and Muscle Strength 5/5 Arises from chair with ease Narrow Based  Gait   Skin:    General: Skin is warm and dry.  Neurological:     Mental Status: He is alert and oriented to person, place, and time.  Psychiatric:        Mood and Affect: Mood normal.        Behavior: Behavior normal.           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  We will continue the opioid monitoring program, this consists of regular clinic visits, examinations, urine drug screen, pill counts as well as use of [place] Controlled Substance Reporting system. A 12 month History has been reviewed on the [place] Controlled Substance Reporting Systemon 12/22/2019. 2. Diabetic peripheral neuropathy affecting both feet:  Continue current medication regimen with Gabapentin PCP Following. 12/22/2019 3. Chronic Low Back Pain:No complaints today.Continue current medication regimen. Continue HEP as Tolerated. Continue to  Monitor.12/22/2019  F/U in 1 month  49minutes of face to face patient care time was spent during this visit. All questions were encouraged and answered.

## 2020-01-21 ENCOUNTER — Encounter: Payer: Medicare Other | Admitting: Registered Nurse

## 2020-01-23 ENCOUNTER — Encounter: Payer: Self-pay | Admitting: Registered Nurse

## 2020-01-23 ENCOUNTER — Encounter: Payer: Medicare Other | Attending: Physical Medicine & Rehabilitation | Admitting: Registered Nurse

## 2020-01-23 ENCOUNTER — Other Ambulatory Visit: Payer: Self-pay

## 2020-01-23 VITALS — BP 159/68 | HR 64 | Temp 97.9°F | Ht 67.0 in | Wt 166.2 lb

## 2020-01-23 DIAGNOSIS — Z86018 Personal history of other benign neoplasm: Secondary | ICD-10-CM | POA: Diagnosis not present

## 2020-01-23 DIAGNOSIS — G894 Chronic pain syndrome: Secondary | ICD-10-CM | POA: Insufficient documentation

## 2020-01-23 DIAGNOSIS — Z79891 Long term (current) use of opiate analgesic: Secondary | ICD-10-CM

## 2020-01-23 DIAGNOSIS — E1149 Type 2 diabetes mellitus with other diabetic neurological complication: Secondary | ICD-10-CM | POA: Diagnosis present

## 2020-01-23 DIAGNOSIS — Z5181 Encounter for therapeutic drug level monitoring: Secondary | ICD-10-CM | POA: Diagnosis not present

## 2020-01-23 DIAGNOSIS — E1140 Type 2 diabetes mellitus with diabetic neuropathy, unspecified: Secondary | ICD-10-CM | POA: Diagnosis not present

## 2020-01-23 MED ORDER — OXYCODONE HCL 15 MG PO TABS: 15.0000 mg | ORAL_TABLET | Freq: Three times a day (TID) | ORAL | 0 refills | Status: DC | PRN

## 2020-01-23 NOTE — Progress Notes (Signed)
Subjective:    Patient ID: Troy [name], male    DOB: 07-05-1945, 74 y.o.   MRN: [id number]  HPI: Troy [name] is a 74 y.o. male who returns for follow up appointment for chronic pain and medication refill. He states his pain is located in his right foot. He rates his pain 8. His current exercise regime is walking and performing stretching exercises.  Mr. Housman Morphine equivalent is 63.00 MME.  Last UDS was Performed on 10/22/2019, it was consistent.    Pain Inventory Average Pain 8 Pain Right Now 8 My pain is intermittent, constant, aching and numbness  In the last 24 hours, has pain interfered with the following? General activity 8 Relation with others 8 Enjoyment of life 8 What TIME of day is your pain at its worst? morning  and evening Sleep (in general) Good  Pain is worse with: some activites Pain improves with: heat/ice and medication Relief from Meds: 8  Family History  Problem Relation Age of Onset  . Hypertension Mother   . Diabetes Mother   . Stroke Mother        multiple  . Hypertension Father   . Stroke Father   . Stroke Sister   . Stroke Daughter    Social History   Socioeconomic History  . Marital status: Married    Spouse name: Not on file  . Number of children: Not on file  . Years of education: Not on file  . Highest education level: Not on file  Occupational History  . Not on file  Tobacco Use  . Smoking status: Never Smoker  . Smokeless tobacco: Never Used  Vaping Use  . Vaping Use: Never used  Substance and Sexual Activity  . Alcohol use: No    Alcohol/week: 0.0 standard drinks  . Drug use: No  . Sexual activity: Not on file  Other Topics Concern  . Not on file  Social History Narrative  . Not on file   Social Determinants of Health   Financial Resource Strain:   . Difficulty of Paying Living Expenses: Not on file  Food Insecurity:   . Worried About Charity fundraiser in the Last Year: Not on file  . Ran Out of Food in the  Last Year: Not on file  Transportation Needs:   . Lack of Transportation (Medical): Not on file  . Lack of Transportation (Non-Medical): Not on file  Physical Activity:   . Days of Exercise per Week: Not on file  . Minutes of Exercise per Session: Not on file  Stress:   . Feeling of Stress : Not on file  Social Connections:   . Frequency of Communication with Friends and Family: Not on file  . Frequency of Social Gatherings with Friends and Family: Not on file  . Attends Religious Services: Not on file  . Active Member of Clubs or Organizations: Not on file  . Attends Archivist Meetings: Not on file  . Marital Status: Not on file   Past Surgical History:  Procedure Laterality Date  . CARDIAC SURGERY  2003   stent  . IMPLANTATION VAGAL NERVE STIMULATOR    . TRIGGER FINGER RELEASE    . TUMOR REMOVAL  2002   S1 tumor removal by Dr. Annette Stable  . VAGAL NERVE STIMULATOR REMOVAL     Past Surgical History:  Procedure Laterality Date  . CARDIAC SURGERY  2003   stent  . IMPLANTATION VAGAL NERVE STIMULATOR    .  TRIGGER FINGER RELEASE    . TUMOR REMOVAL  2002   S1 tumor removal by Dr. Annette Stable  . VAGAL NERVE STIMULATOR REMOVAL     Past Medical History:  Diagnosis Date  . Arthritis   . Diabetes mellitus   . High blood pressure   . Plantar fasciitis    BP (!) 178/64   Pulse 64   Temp 97.9 F (36.6 C)   Ht 5\' 7"  (1.702 m)   Wt 166 lb 3.2 oz (75.4 kg)   SpO2 97%   BMI 26.03 kg/m   Opioid Risk Score:   Fall Risk Score:  `1  Depression screen PHQ 2/9  Depression screen [facility] 2/9 10/22/2019 07/24/2019 06/25/2019 02/24/2019 01/29/2018 12/31/2017 11/28/2017  Decreased Interest 0 1 1 1  0 0 0  Down, Depressed, Hopeless 0 1 1 1 1 1  0  PHQ - 2 Score 0 2 2 2 1 1  0  Altered sleeping - - - - - - -  Tired, decreased energy - - - - - - -  Change in appetite - - - - - - -  Feeling bad or failure about yourself  - - - - - - -  Trouble concentrating - - - - - - -  Moving slowly or  fidgety/restless - - - - - - -  Suicidal thoughts - - - - - - -  PHQ-9 Score - - - - - - -   Review of Systems  Neurological: Positive for numbness.       Numbness in right foot   All other systems reviewed and are negative.      Objective:   Physical Exam Vitals and nursing note reviewed.  Constitutional:      Appearance: Normal appearance.  Cardiovascular:     Rate and Rhythm: Normal rate and regular rhythm.     Pulses: Normal pulses.     Heart sounds: Normal heart sounds.  Pulmonary:     Effort: Pulmonary effort is normal.     Breath sounds: Normal breath sounds.  Musculoskeletal:     Cervical back: Normal range of motion and neck supple.     Comments: Normal Muscle Bulk and Muscle Testing Reveals:  Upper Extremities: Full ROM and Muscle Strength 5/5  Lower Extremities: Full ROM and Muscle Strength 5/5 Arises from chair with ease Narrow Based  Gait   Skin:    General: Skin is warm and dry.  Neurological:     Mental Status: He is alert and oriented to person, place, and time.  Psychiatric:        Mood and Affect: Mood normal.        Behavior: Behavior normal.           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  We will continue the opioid monitoring program, this consists of regular clinic visits, examinations, urine drug screen, pill counts as well as use of [place] Controlled Substance Reporting system. A 12 month History has been reviewed on the [place] Controlled Substance Reporting Systemon12/04/2019. 2. Diabetic peripheral neuropathy affecting both feet: Continue current medication regimen with Gabapentin PCP Following. 01/23/2020 3. Chronic Low Back Pain:No complaints today.Continue current medication regimen. Continue HEP as Tolerated. Continue to Monitor.01/23/2020  F/U in 1 month

## 2020-03-05 ENCOUNTER — Encounter: Payer: Self-pay | Admitting: Registered Nurse

## 2020-03-05 ENCOUNTER — Other Ambulatory Visit: Payer: Self-pay | Admitting: Registered Nurse

## 2020-03-05 ENCOUNTER — Encounter: Payer: Medicare Other | Attending: Physical Medicine & Rehabilitation | Admitting: Registered Nurse

## 2020-03-05 ENCOUNTER — Other Ambulatory Visit: Payer: Self-pay

## 2020-03-05 VITALS — BP 177/71 | HR 77 | Temp 98.4°F | Ht 67.0 in | Wt 169.0 lb

## 2020-03-05 DIAGNOSIS — G894 Chronic pain syndrome: Secondary | ICD-10-CM | POA: Diagnosis not present

## 2020-03-05 DIAGNOSIS — E1140 Type 2 diabetes mellitus with diabetic neuropathy, unspecified: Secondary | ICD-10-CM | POA: Insufficient documentation

## 2020-03-05 DIAGNOSIS — Z86018 Personal history of other benign neoplasm: Secondary | ICD-10-CM | POA: Diagnosis not present

## 2020-03-05 DIAGNOSIS — Z5181 Encounter for therapeutic drug level monitoring: Secondary | ICD-10-CM | POA: Diagnosis not present

## 2020-03-05 DIAGNOSIS — E1149 Type 2 diabetes mellitus with other diabetic neurological complication: Secondary | ICD-10-CM | POA: Diagnosis present

## 2020-03-05 DIAGNOSIS — I10 Essential (primary) hypertension: Secondary | ICD-10-CM | POA: Insufficient documentation

## 2020-03-05 DIAGNOSIS — Z79891 Long term (current) use of opiate analgesic: Secondary | ICD-10-CM | POA: Diagnosis not present

## 2020-03-05 MED ORDER — GABAPENTIN 400 MG PO CAPS: ORAL_CAPSULE | ORAL | 1 refills | Status: DC

## 2020-03-05 MED ORDER — OXYCODONE HCL 15 MG PO TABS: 15.0000 mg | ORAL_TABLET | Freq: Three times a day (TID) | ORAL | 0 refills | Status: DC | PRN

## 2020-03-05 NOTE — Progress Notes (Signed)
Subjective:    Patient ID: Troy [name], male    DOB: May 05, 1945, 75 y.o.   MRN: [id number]  HPI: Troy [name] is a 75 y.o. male who returns for follow up appointment for chronic pain and medication refill. He states his pain is located in his right foot. He rates his pain 10. His current exercise regime is walking was encouraged to increase his HEP as tolerated.   Troy [name] arrived to office with uncontrolled hypertension, he states he think he forgot to pick up his prescription from [facility]. He's been out of his medication since Monday, he thinks. Educated on medication compliance, he verbalizes understanding. Blood pressure was re-checked. He will F/U with PCP, he verbalizes understanding.   Troy [name] is 66.32 MME.  UDS ordered today.    Pain Inventory Average Pain 9 Pain Right Now 10 My pain is constant and aching  In the last 24 hours, has pain interfered with the following? General activity 9 Relation with others 9 Enjoyment of life 9 What TIME of day is your pain at its worst? daytime and evening Sleep (in general) Good  Pain is worse with: walking Pain improves with: rest and medication Relief from Meds: 8  Family History  Problem Relation Age of Onset  . Hypertension Mother   . Diabetes Mother   . Stroke Mother        multiple  . Hypertension Father   . Stroke Father   . Stroke Sister   . Stroke Daughter    Social History   Socioeconomic History  . Marital status: Married    Spouse name: Not on file  . Number of children: Not on file  . Years of education: Not on file  . Highest education level: Not on file  Occupational History  . Not on file  Tobacco Use  . Smoking status: Never Smoker  . Smokeless tobacco: Never Used  Vaping Use  . Vaping Use: Never used  Substance and Sexual Activity  . Alcohol use: No    Alcohol/week: 0.0 standard drinks  . Drug use: No  . Sexual activity: Not on file  Other Topics Concern  . Not on file   Social History Narrative  . Not on file   Social Determinants of Health   Financial Resource Strain: Not on file  Food Insecurity: Not on file  Transportation Needs: Not on file  Physical Activity: Not on file  Stress: Not on file  Social Connections: Not on file   Past Surgical History:  Procedure Laterality Date  . CARDIAC SURGERY  2003   stent  . IMPLANTATION VAGAL NERVE STIMULATOR    . TRIGGER FINGER RELEASE    . TUMOR REMOVAL  2002   S1 tumor removal by Dr. Annette Stable  . VAGAL NERVE STIMULATOR REMOVAL     Past Surgical History:  Procedure Laterality Date  . CARDIAC SURGERY  2003   stent  . IMPLANTATION VAGAL NERVE STIMULATOR    . TRIGGER FINGER RELEASE    . TUMOR REMOVAL  2002   S1 tumor removal by Dr. Annette Stable  . VAGAL NERVE STIMULATOR REMOVAL     Past Medical History:  Diagnosis Date  . Arthritis   . Diabetes mellitus   . High blood pressure   . Plantar fasciitis    BP (!) 170/69   Pulse 77   Temp 98.4 F (36.9 C)   Ht 5\' 7"  (1.702 m)   Wt 169 lb (76.7 kg)  SpO2 96%   BMI 26.47 kg/m   Opioid Risk Score:   Fall Risk Score:  `1  Depression screen PHQ 2/9  Depression screen [facility] 2/9 01/23/2020 10/22/2019 07/24/2019 06/25/2019 02/24/2019 01/29/2018 12/31/2017  Decreased Interest 0 0 1 1 1  0 0  Down, Depressed, Hopeless 0 0 1 1 1 1 1   PHQ - 2 Score 0 0 2 2 2 1 1   Altered sleeping - - - - - - -  Tired, decreased energy - - - - - - -  Change in appetite - - - - - - -  Feeling bad or failure about yourself  - - - - - - -  Trouble concentrating - - - - - - -  Moving slowly or fidgety/restless - - - - - - -  Suicidal thoughts - - - - - - -  PHQ-9 Score - - - - - - -    Review of Systems  Constitutional: Negative.   HENT: Negative.   Eyes: Negative.   Respiratory: Negative.   Cardiovascular: Negative.   Gastrointestinal: Negative.   Endocrine: Negative.   Genitourinary: Negative.   Musculoskeletal: Positive for gait problem.  Skin: Negative.    Allergic/Immunologic: Negative.   Hematological: Negative.   Psychiatric/Behavioral: Negative.   All other systems reviewed and are negative.      Objective:   Physical Exam Vitals and nursing note reviewed.  Constitutional:      Appearance: Normal appearance.  Cardiovascular:     Rate and Rhythm: Normal rate and regular rhythm.     Pulses: Normal pulses.     Heart sounds: Normal heart sounds.  Pulmonary:     Effort: Pulmonary effort is normal.     Breath sounds: Normal breath sounds.  Musculoskeletal:     Cervical back: Normal range of motion and neck supple.     Comments: Normal Muscle Bulk and Muscle Testing Reveals:  Upper Extremities: Full ROM and Muscle Strength 5/5 Lower Extremities: Full ROM and Muscle Strength 5/5 Arises from chair with ease Narrow Based  Gait   Skin:    General: Skin is warm and dry.  Neurological:     Mental Status: He is alert and oriented to person, place, and time.  Psychiatric:        Mood and Affect: Mood normal.        Behavior: Behavior normal.           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  We will continue the opioid monitoring program, this consists of regular clinic visits, examinations, urine drug screen, pill counts as well as use of [place] Controlled Substance Reporting system. A 12 month History has been reviewed on the [place] Controlled Substance Reporting Systemon01/14/2022. 2. Diabetic peripheral neuropathy affecting both feet: Continue current medication regimen with Gabapentin PCP Following. 03/05/2020 3. Chronic Low Back Pain:No complaints today.Continue current medication regimen. Continue HEP as Tolerated. Continue to Monitor.03/05/2020 4. Uncontrolled Hypertension: Troy [name] thinks he forgot to pick up his medication from [facility] and believes  he's been out of medication since Monday 03/01/2020. He  was educated on medication compliance: He verbalizes understanding. He will pick up his medication today and F/U with his PCP. He verbalizes understanding.   F/U in 1 month

## 2020-03-11 ENCOUNTER — Telehealth: Payer: Self-pay

## 2020-03-11 MED ORDER — OXYCODONE HCL 15 MG PO TABS: 15.0000 mg | ORAL_TABLET | Freq: Three times a day (TID) | ORAL | 0 refills | Status: DC | PRN

## 2020-03-11 NOTE — Telephone Encounter (Signed)
[facility] does not have Oxycodone IR 15 mg in stock and will have it until next Thursday but [facility] on [address] in [place], [place] has it in stock. Can you send to [place]? Calling [facility] to cancel.

## 2020-03-11 NOTE — Telephone Encounter (Signed)
PMP was reviewed. Oxycodone e- scribed today.  Lysle Morales CMA spoke with Ms. Ricard Dillon regarding the above.

## 2020-03-12 LAB — TOXASSURE SELECT,+ANTIDEPR,UR

## 2020-03-15 ENCOUNTER — Telehealth: Payer: Self-pay | Admitting: *Deleted

## 2020-03-15 NOTE — Telephone Encounter (Signed)
Urine drug screen for this encounter is consistent for prescribed medication 

## 2020-03-25 ENCOUNTER — Ambulatory Visit: Payer: Medicare Other | Admitting: Registered Nurse

## 2020-04-06 ENCOUNTER — Encounter: Payer: Medicare Other | Attending: Physical Medicine & Rehabilitation | Admitting: Registered Nurse

## 2020-04-06 ENCOUNTER — Other Ambulatory Visit: Payer: Self-pay

## 2020-04-06 ENCOUNTER — Encounter: Payer: Self-pay | Admitting: Registered Nurse

## 2020-04-06 VITALS — BP 150/68 | HR 62 | Temp 98.0°F | Wt 166.4 lb

## 2020-04-06 DIAGNOSIS — Z5181 Encounter for therapeutic drug level monitoring: Secondary | ICD-10-CM | POA: Diagnosis not present

## 2020-04-06 DIAGNOSIS — E1140 Type 2 diabetes mellitus with diabetic neuropathy, unspecified: Secondary | ICD-10-CM | POA: Insufficient documentation

## 2020-04-06 DIAGNOSIS — E1149 Type 2 diabetes mellitus with other diabetic neurological complication: Secondary | ICD-10-CM | POA: Diagnosis present

## 2020-04-06 DIAGNOSIS — G894 Chronic pain syndrome: Secondary | ICD-10-CM | POA: Diagnosis not present

## 2020-04-06 DIAGNOSIS — Z79891 Long term (current) use of opiate analgesic: Secondary | ICD-10-CM | POA: Diagnosis present

## 2020-04-06 DIAGNOSIS — Z86018 Personal history of other benign neoplasm: Secondary | ICD-10-CM | POA: Diagnosis not present

## 2020-04-06 MED ORDER — OXYCODONE HCL 15 MG PO TABS: 15.0000 mg | ORAL_TABLET | Freq: Three times a day (TID) | ORAL | 0 refills | Status: DC | PRN

## 2020-04-06 NOTE — Progress Notes (Signed)
Subjective:    Patient ID: Troy [name], male    DOB: 26-Feb-1945, 75 y.o.   MRN: [id number]  HPI: Troy [name] is a 75 y.o. male who returns for follow up appointment for chronic pain and medication refill. He states his pain is located in his right foot with numbness. He rates his pain 9. His current exercise regime is walking and performing stretching exercises.  [name] Morphine equivalent is 67.50 MME.    Last UDS was Performed on 03/05/2020, it was consistent.    Pain Inventory Average Pain 9 Pain Right Now 9 My pain is constant and aching  In the last 24 hours, has pain interfered with the following? General activity 9 Relation with others 8 Enjoyment of life 9 What TIME of day is your pain at its worst? daytime and evening Sleep (in general) Fair  Pain is worse with: walking and inactivity Pain improves with: rest and medication Relief from Meds: 9  Family History  Problem Relation Age of Onset  . Hypertension Mother   . Diabetes Mother   . Stroke Mother        multiple  . Hypertension Father   . Stroke Father   . Stroke Sister   . Stroke Daughter    Social History   Socioeconomic History  . Marital status: Married    Spouse name: Not on file  . Number of children: Not on file  . Years of education: Not on file  . Highest education level: Not on file  Occupational History  . Not on file  Tobacco Use  . Smoking status: Never Smoker  . Smokeless tobacco: Never Used  Vaping Use  . Vaping Use: Never used  Substance and Sexual Activity  . Alcohol use: No    Alcohol/week: 0.0 standard drinks  . Drug use: No  . Sexual activity: Not on file  Other Topics Concern  . Not on file  Social History Narrative  . Not on file   Social Determinants of Health   Financial Resource Strain: Not on file  Food Insecurity: Not on file  Transportation Needs: Not on file  Physical Activity: Not on file  Stress: Not on file  Social Connections: Not on file    Past Surgical History:  Procedure Laterality Date  . CARDIAC SURGERY  2003   stent  . IMPLANTATION VAGAL NERVE STIMULATOR    . TRIGGER FINGER RELEASE    . TUMOR REMOVAL  2002   S1 tumor removal by Dr. Annette Stable  . VAGAL NERVE STIMULATOR REMOVAL     Past Surgical History:  Procedure Laterality Date  . CARDIAC SURGERY  2003   stent  . IMPLANTATION VAGAL NERVE STIMULATOR    . TRIGGER FINGER RELEASE    . TUMOR REMOVAL  2002   S1 tumor removal by Dr. Annette Stable  . VAGAL NERVE STIMULATOR REMOVAL     Past Medical History:  Diagnosis Date  . Arthritis   . Diabetes mellitus   . High blood pressure   . Plantar fasciitis    BP (!) 150/68   Pulse (!) 56   Temp 98 F (36.7 C)   Wt 166 lb 6.4 oz (75.5 kg)   SpO2 97%   BMI 26.06 kg/m   Opioid Risk Score:   Fall Risk Score:  `1  Depression screen PHQ 2/9  Depression screen [facility] 2/9 01/23/2020 10/22/2019 07/24/2019 06/25/2019 02/24/2019 01/29/2018 12/31/2017  Decreased Interest 0 0 1 1 1  0 0  Down,  Depressed, Hopeless 0 0 1 1 1 1 1   PHQ - 2 Score 0 0 2 2 2 1 1   Altered sleeping - - - - - - -  Tired, decreased energy - - - - - - -  Change in appetite - - - - - - -  Feeling bad or failure about yourself  - - - - - - -  Trouble concentrating - - - - - - -  Moving slowly or fidgety/restless - - - - - - -  Suicidal thoughts - - - - - - -  PHQ-9 Score - - - - - - -   Review of Systems  Musculoskeletal: Positive for gait problem.       Right foot pain, balance problem  All other systems reviewed and are negative.      Objective:   Physical Exam Vitals and nursing note reviewed.  Constitutional:      Appearance: Normal appearance.  Cardiovascular:     Rate and Rhythm: Normal rate and regular rhythm.     Pulses: Normal pulses.     Heart sounds: Normal heart sounds.  Pulmonary:     Effort: Pulmonary effort is normal.     Breath sounds: Normal breath sounds.  Musculoskeletal:     Cervical back: Normal range of motion and neck supple.      Comments: Normal Muscle Bulk and Muscle Testing Reveals:  Upper Extremities: Full ROM and Muscle Strength 5/5  Lower Extremities: Full ROM and Muscle Strength 5/5 Arises from chair with ease Narrow Based  Gait   Skin:    General: Skin is warm and dry.  Neurological:     Mental Status: He is alert and oriented to person, place, and time.  Psychiatric:        Mood and Affect: Mood normal.        Behavior: Behavior normal.           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  We will continue the opioid monitoring program, this consists of regular clinic visits, examinations, urine drug screen, pill counts as well as use of [place] Controlled Substance Reporting system. A 12 month History has been reviewed on the [place] Controlled Substance Reporting Systemon02/15/2022. 2. Diabetic peripheral neuropathy affecting both feet: Continue current medication regimen with Gabapentin .04/06/2020 3. Chronic Low Back Pain:No complaints today.Continue current medication regimen. Continue HEP as Tolerated. Continue to Monitor.04/06/2020  F/U in 1 month

## 2020-05-04 ENCOUNTER — Encounter: Payer: Medicare Other | Admitting: Registered Nurse

## 2020-05-04 NOTE — Progress Notes (Deleted)
Subjective:    Patient ID: Troy [name], male    DOB: [birth year], 75 y.o.   MRN: [id number]  HPI Pain Inventory Average Pain 9 Pain Right Now 9 My pain is constant and aching  In the last 24 hours, has pain interfered with the following? General activity 9 Relation with others 8 Enjoyment of life 9 What TIME of day is your pain at its worst? daytime and evening Sleep (in general) Fair  Pain is worse with: walking and inactivity Pain improves with: rest and medication Relief from Meds: 9  Family History  Problem Relation Age of Onset  . Hypertension Mother   . Diabetes Mother   . Stroke Mother        multiple  . Hypertension Father   . Stroke Father   . Stroke Sister   . Stroke Daughter    Social History   Socioeconomic History  . Marital status: Married    Spouse name: Not on file  . Number of children: Not on file  . Years of education: Not on file  . Highest education level: Not on file  Occupational History  . Not on file  Tobacco Use  . Smoking status: Never Smoker  . Smokeless tobacco: Never Used  Vaping Use  . Vaping Use: Never used  Substance and Sexual Activity  . Alcohol use: No    Alcohol/week: 0.0 standard drinks  . Drug use: No  . Sexual activity: Not on file  Other Topics Concern  . Not on file  Social History Narrative  . Not on file   Social Determinants of Health   Financial Resource Strain: Not on file  Food Insecurity: Not on file  Transportation Needs: Not on file  Physical Activity: Not on file  Stress: Not on file  Social Connections: Not on file   Past Surgical History:  Procedure Laterality Date  . CARDIAC SURGERY  2003   stent  . IMPLANTATION VAGAL NERVE STIMULATOR    . TRIGGER FINGER RELEASE    . TUMOR REMOVAL  2002   S1 tumor removal by Dr. Annette Stable  . VAGAL NERVE STIMULATOR REMOVAL     Past Surgical History:  Procedure Laterality Date  . CARDIAC SURGERY  2003   stent  . IMPLANTATION VAGAL NERVE STIMULATOR    .  TRIGGER FINGER RELEASE    . TUMOR REMOVAL  2002   S1 tumor removal by Dr. Annette Stable  . VAGAL NERVE STIMULATOR REMOVAL     Past Medical History:  Diagnosis Date  . Arthritis   . Diabetes mellitus   . High blood pressure   . Plantar fasciitis    There were no vitals taken for this visit.  Opioid Risk Score:   Fall Risk Score:  `1  Depression screen PHQ 2/9  Depression screen [facility] 2/9 04/06/2020 01/23/2020 10/22/2019 07/24/2019 06/25/2019 02/24/2019 01/29/2018  Decreased Interest 0 0 0 1 1 1  0  Down, Depressed, Hopeless 0 0 0 1 1 1 1   PHQ - 2 Score 0 0 0 2 2 2 1   Altered sleeping - - - - - - -  Tired, decreased energy - - - - - - -  Change in appetite - - - - - - -  Feeling bad or failure about yourself  - - - - - - -  Trouble concentrating - - - - - - -  Moving slowly or fidgety/restless - - - - - - -  Suicidal thoughts - - - - - - -  PHQ-9 Score - - - - - - -    Review of Systems  Musculoskeletal: Positive for gait problem.       Right foot pain  All other systems reviewed and are negative.      Objective:   Physical Exam        Assessment & Plan:

## 2020-05-06 ENCOUNTER — Other Ambulatory Visit: Payer: Self-pay

## 2020-05-06 ENCOUNTER — Encounter: Payer: Self-pay | Admitting: Registered Nurse

## 2020-05-06 ENCOUNTER — Encounter: Payer: Medicare Other | Attending: Physical Medicine & Rehabilitation | Admitting: Registered Nurse

## 2020-05-06 VITALS — BP 146/69 | HR 60 | Ht 67.0 in | Wt 163.0 lb

## 2020-05-06 DIAGNOSIS — E1140 Type 2 diabetes mellitus with diabetic neuropathy, unspecified: Secondary | ICD-10-CM | POA: Diagnosis not present

## 2020-05-06 DIAGNOSIS — Z5181 Encounter for therapeutic drug level monitoring: Secondary | ICD-10-CM

## 2020-05-06 DIAGNOSIS — Z79891 Long term (current) use of opiate analgesic: Secondary | ICD-10-CM | POA: Diagnosis present

## 2020-05-06 DIAGNOSIS — E1149 Type 2 diabetes mellitus with other diabetic neurological complication: Secondary | ICD-10-CM | POA: Insufficient documentation

## 2020-05-06 DIAGNOSIS — G894 Chronic pain syndrome: Secondary | ICD-10-CM | POA: Diagnosis not present

## 2020-05-06 DIAGNOSIS — Z86018 Personal history of other benign neoplasm: Secondary | ICD-10-CM | POA: Diagnosis not present

## 2020-05-06 MED ORDER — OXYCODONE HCL 15 MG PO TABS: 15.0000 mg | ORAL_TABLET | Freq: Three times a day (TID) | ORAL | 0 refills | Status: DC | PRN

## 2020-05-06 NOTE — Progress Notes (Signed)
Subjective:    Patient ID: Troy [name], male    DOB: [birth year], 75 y.o.   MRN: [id number]  HPI: Troy [name] is a 75 y.o. male who returns for follow up appointment for chronic pain and medication refill. He states his pain is located in his right foot with tingling and numbness, also reports increase intensity of neuropathic pain. He states he's compliant with his medications. We will continue to monitor , medications remain the same at this time. He verbalizes understanding. Herates his pain 10. His  current exercise regime is walking and performing stretching exercises.  Troy [name] Morphine equivalent is 67.50 MME.  Last UDS was Performed on 03/05/2020, it was consistent.    Pain Inventory Average Pain 10 Pain Right Now 10 My pain is constant, sharp and aching  In the last 24 hours, has pain interfered with the following? General activity 10 Relation with others 10 Enjoyment of life 10 What TIME of day is your pain at its worst? daytime and evening Sleep (in general) Fair  Pain is worse with: walking, bending and some activites Pain improves with: rest and medication Relief from Meds: 8  Family History  Problem Relation Age of Onset  . Hypertension Mother   . Diabetes Mother   . Stroke Mother        multiple  . Hypertension Father   . Stroke Father   . Stroke Sister   . Stroke Daughter    Social History   Socioeconomic History  . Marital status: Married    Spouse name: Not on file  . Number of children: Not on file  . Years of education: Not on file  . Highest education level: Not on file  Occupational History  . Not on file  Tobacco Use  . Smoking status: Never Smoker  . Smokeless tobacco: Never Used  Vaping Use  . Vaping Use: Never used  Substance and Sexual Activity  . Alcohol use: No    Alcohol/week: 0.0 standard drinks  . Drug use: No  . Sexual activity: Not on file  Other Topics Concern  . Not on file  Social History Narrative  . Not on file    Social Determinants of Health   Financial Resource Strain: Not on file  Food Insecurity: Not on file  Transportation Needs: Not on file  Physical Activity: Not on file  Stress: Not on file  Social Connections: Not on file   Past Surgical History:  Procedure Laterality Date  . CARDIAC SURGERY  2003   stent  . IMPLANTATION VAGAL NERVE STIMULATOR    . TRIGGER FINGER RELEASE    . TUMOR REMOVAL  2002   S1 tumor removal by Dr. Annette Stable  . VAGAL NERVE STIMULATOR REMOVAL     Past Surgical History:  Procedure Laterality Date  . CARDIAC SURGERY  2003   stent  . IMPLANTATION VAGAL NERVE STIMULATOR    . TRIGGER FINGER RELEASE    . TUMOR REMOVAL  2002   S1 tumor removal by Dr. Annette Stable  . VAGAL NERVE STIMULATOR REMOVAL     Past Medical History:  Diagnosis Date  . Arthritis   . Diabetes mellitus   . High blood pressure   . Plantar fasciitis    There were no vitals taken for this visit.  Opioid Risk Score:   Fall Risk Score:  `1  Depression screen PHQ 2/9  Depression screen [employer] 2/9 04/06/2020 01/23/2020 10/22/2019 07/24/2019 06/25/2019 02/24/2019 01/29/2018  Decreased Interest 0 0  0 1 1 1  0  Down, Depressed, Hopeless 0 0 0 1 1 1 1   PHQ - 2 Score 0 0 0 2 2 2 1   Altered sleeping - - - - - - -  Tired, decreased energy - - - - - - -  Change in appetite - - - - - - -  Feeling bad or failure about yourself  - - - - - - -  Trouble concentrating - - - - - - -  Moving slowly or fidgety/restless - - - - - - -  Suicidal thoughts - - - - - - -  PHQ-9 Score - - - - - - -    Review of Systems  Constitutional: Negative.   HENT: Positive for hearing loss.   Eyes: Negative.   Respiratory: Negative.   Cardiovascular: Negative.   Gastrointestinal: Negative.   Endocrine: Negative.   Genitourinary: Negative.   Musculoskeletal: Positive for gait problem.  Skin: Negative.   Allergic/Immunologic: Negative.   Hematological: Negative.   Psychiatric/Behavioral: Negative.        Objective:    Physical Exam Vitals and nursing note reviewed.  Constitutional:      Appearance: Normal appearance.  Cardiovascular:     Rate and Rhythm: Normal rate and regular rhythm.     Pulses: Normal pulses.     Heart sounds: Normal heart sounds.  Pulmonary:     Effort: Pulmonary effort is normal.     Breath sounds: Normal breath sounds.  Musculoskeletal:     Cervical back: Normal range of motion and neck supple.     Comments: Normal Muscle Bulk and Muscle Testing Reveals:  Upper Extremities: Full ROM and Muscle Strength 5/5 Lower Extremities: Full ROM and Muscle Strength 5/5 Arises from chair with ease Narrow Based  Gait   Skin:    General: Skin is warm and dry.  Neurological:     Mental Status: He is alert and oriented to person, place, and time.  Psychiatric:        Mood and Affect: Mood normal.        Behavior: Behavior normal.           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  We will continue the opioid monitoring program, this consists of regular clinic visits, examinations, urine drug screen, pill counts as well as use of [place] Controlled Substance Reporting system. A 12 month History has been reviewed on the [place] Controlled Substance Reporting Systemon03/17/2022. 2. Diabetic peripheral neuropathy affecting both feet: Continue current medication regimen with Gabapentin .05/06/2020 3. Chronic Low Back Pain:No complaints today.Continue current medication regimen. Continue HEP as Tolerated. Continue to Monitor.05/06/2020  F/U in 1 month

## 2020-06-07 ENCOUNTER — Encounter: Payer: Medicare Other | Attending: Physical Medicine & Rehabilitation | Admitting: Registered Nurse

## 2020-06-07 ENCOUNTER — Other Ambulatory Visit: Payer: Self-pay

## 2020-06-07 ENCOUNTER — Encounter: Payer: Self-pay | Admitting: Registered Nurse

## 2020-06-07 VITALS — BP 177/65 | HR 82 | Temp 98.5°F | Ht 67.0 in | Wt 165.0 lb

## 2020-06-07 DIAGNOSIS — G894 Chronic pain syndrome: Secondary | ICD-10-CM | POA: Diagnosis not present

## 2020-06-07 DIAGNOSIS — Z5181 Encounter for therapeutic drug level monitoring: Secondary | ICD-10-CM | POA: Diagnosis not present

## 2020-06-07 DIAGNOSIS — Z79891 Long term (current) use of opiate analgesic: Secondary | ICD-10-CM | POA: Diagnosis present

## 2020-06-07 DIAGNOSIS — E1140 Type 2 diabetes mellitus with diabetic neuropathy, unspecified: Secondary | ICD-10-CM | POA: Diagnosis not present

## 2020-06-07 DIAGNOSIS — Z86018 Personal history of other benign neoplasm: Secondary | ICD-10-CM | POA: Insufficient documentation

## 2020-06-07 DIAGNOSIS — E1149 Type 2 diabetes mellitus with other diabetic neurological complication: Secondary | ICD-10-CM | POA: Insufficient documentation

## 2020-06-07 DIAGNOSIS — I10 Essential (primary) hypertension: Secondary | ICD-10-CM | POA: Diagnosis present

## 2020-06-07 NOTE — Progress Notes (Signed)
Subjective:    Patient ID: Troy [name], male    DOB: [birth year], 75 y.o.   MRN: [id number]  HPI: Troy [name] [name] a 75 y.o. male who returns for follow up appointment for chronic pain and medication refill. He states his pain [name] located in his right foot with tingling and numbness. He rates his pain 10. His current exercise regime [name] walking and performing stretching exercises.   Troy [name] arrived hypertensive, he states "he was started on a new medication and has to wait 2 hours before taking his anti-hypertensive medication. Blood pressure was rechecked and he has taken his medication in the room.  Blood pressure was re-checked by Troy [name] BP 177/65.   Troy [name] 67.50 MME.    Last UDS was Performed on 03/05/2020, it was consistent.    Pain Inventory Average Pain 10 Pain Right Now 10 My pain [name] constant and aching  In the last 24 hours, has pain interfered with the following? General activity 9 Relation with others 9 Enjoyment of life 10 What TIME of day [name] your pain at its worst? daytime, evening and night Sleep (in general) Good  Pain [name] worse with: walking and some activites Pain improves with: medication Relief from Meds: 8  Family History  Problem Relation Age of Onset  . Hypertension Mother   . Diabetes Mother   . Stroke Mother        multiple  . Hypertension Father   . Stroke Father   . Stroke Sister   . Stroke Daughter    Social History   Socioeconomic History  . Marital status: Married    Spouse name: Not on file  . Number of children: Not on file  . Years of education: Not on file  . Highest education level: Not on file  Occupational History  . Not on file  Tobacco Use  . Smoking status: Never Smoker  . Smokeless tobacco: Never Used  Vaping Use  . Vaping Use: Never used  Substance and Sexual Activity  . Alcohol use: No    Alcohol/week: 0.0 standard drinks  . Drug use: No  . Sexual activity: Not on file  Other  Topics Concern  . Not on file  Social History Narrative  . Not on file   Social Determinants of Health   Financial Resource Strain: Not on file  Food Insecurity: Not on file  Transportation Needs: Not on file  Physical Activity: Not on file  Stress: Not on file  Social Connections: Not on file   Past Surgical History:  Procedure Laterality Date  . CARDIAC SURGERY  2003   stent  . IMPLANTATION VAGAL NERVE STIMULATOR    . TRIGGER FINGER RELEASE    . TUMOR REMOVAL  2002   S1 tumor removal by Dr. Annette Stable  . VAGAL NERVE STIMULATOR REMOVAL     Past Surgical History:  Procedure Laterality Date  . CARDIAC SURGERY  2003   stent  . IMPLANTATION VAGAL NERVE STIMULATOR    . TRIGGER FINGER RELEASE    . TUMOR REMOVAL  2002   S1 tumor removal by Dr. Annette Stable  . VAGAL NERVE STIMULATOR REMOVAL     Past Medical History:  Diagnosis Date  . Arthritis   . Diabetes mellitus   . High blood pressure   . Plantar fasciitis    BP (!) 193/65   Pulse 80   Temp 98.5 F (36.9 C)   Ht 5\' 7"  (1.702 m)  Wt 165 lb (74.8 kg)   SpO2 97%   BMI 25.84 kg/m   Opioid Risk Score:   Fall Risk Score:  `1  Depression screen PHQ 2/9  Depression screen [name] 2/9 04/06/2020 01/23/2020 10/22/2019 07/24/2019 06/25/2019 02/24/2019 01/29/2018  Decreased Interest 0 0 0 1 1 1  0  Down, Depressed, Hopeless 0 0 0 1 1 1 1   PHQ - 2 Score 0 0 0 2 2 2 1   Altered sleeping - - - - - - -  Tired, decreased energy - - - - - - -  Change in appetite - - - - - - -  Feeling bad or failure about yourself  - - - - - - -  Trouble concentrating - - - - - - -  Moving slowly or fidgety/restless - - - - - - -  Suicidal thoughts - - - - - - -  PHQ-9 Score - - - - - - -    Review of Systems  Constitutional: Negative.   HENT: Negative.   Eyes: Negative.   Respiratory: Negative.   Cardiovascular: Negative.   Gastrointestinal: Negative.   Endocrine: Negative.   Genitourinary: Negative.   Musculoskeletal: Negative.   Skin: Negative.    Allergic/Immunologic: Negative.   Neurological: Negative.   Hematological: Negative.   Psychiatric/Behavioral: Negative.   All other systems reviewed and are negative.      Objective:   Physical Exam Vitals and nursing note reviewed.  Constitutional:      Appearance: Normal appearance.  Cardiovascular:     Rate and Rhythm: Normal rate and regular rhythm.     Pulses: Normal pulses.     Heart sounds: Normal heart sounds.  Pulmonary:     Effort: Pulmonary effort [name] normal.     Breath sounds: Normal breath sounds.  Musculoskeletal:     Cervical back: Normal range of motion and neck supple.     Comments: Normal Muscle Bulk and Muscle Testing Reveals:  Upper Extremities: Full ROM and Muscle Strength 5/5  Lower Extremities: Full ROM and Muscle Strength 5/5 Arises from chair with ease Narrow Based  Gait   Skin:    General: Skin [name] warm and dry.  Neurological:     Mental Status: He [name] alert and oriented to person, place, and time.  Psychiatric:        Mood and Affect: Mood normal.        Behavior: Behavior normal.           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  We will continue the opioid monitoring program, this consists of regular clinic visits, examinations, urine drug screen, pill counts as well as use of [place] Controlled Substance Reporting system. A 12 month History has been reviewed on the [place] Controlled Substance Reporting Systemon04/18/2022. 2. Diabetic peripheral neuropathy affecting both feet: Continue current medication regimen with Gabapentin .06/07/2020 3. Chronic Low Back Pain:No complaints today.Continue current medication regimen. Continue HEP as Tolerated. Continue to Monitor.06/07/2020  F/U in 1 month

## 2020-06-08 ENCOUNTER — Encounter: Payer: Medicare Other | Admitting: Registered Nurse

## 2020-06-10 ENCOUNTER — Telehealth: Payer: Self-pay | Admitting: *Deleted

## 2020-06-10 MED ORDER — OXYCODONE HCL 15 MG PO TABS: 15.0000 mg | ORAL_TABLET | Freq: Three times a day (TID) | ORAL | 0 refills | Status: DC | PRN

## 2020-06-10 NOTE — Telephone Encounter (Signed)
PMP was Reviewed.  Oxycodone e-scribed today.  

## 2020-06-10 NOTE — Telephone Encounter (Signed)
Mrs Mau called and reports his oxycodone was not sent in. Per PMP last fill date was 05/08/20.  He does not have another Rx in system.

## 2020-07-09 ENCOUNTER — Other Ambulatory Visit: Payer: Self-pay

## 2020-07-09 ENCOUNTER — Encounter: Payer: Self-pay | Admitting: Registered Nurse

## 2020-07-09 ENCOUNTER — Encounter: Payer: Medicare Other | Attending: Physical Medicine & Rehabilitation | Admitting: Registered Nurse

## 2020-07-09 VITALS — BP 163/79 | HR 63 | Temp 98.6°F | Ht 68.0 in | Wt 160.0 lb

## 2020-07-09 DIAGNOSIS — G894 Chronic pain syndrome: Secondary | ICD-10-CM | POA: Diagnosis not present

## 2020-07-09 DIAGNOSIS — E1149 Type 2 diabetes mellitus with other diabetic neurological complication: Secondary | ICD-10-CM | POA: Insufficient documentation

## 2020-07-09 DIAGNOSIS — Z79891 Long term (current) use of opiate analgesic: Secondary | ICD-10-CM

## 2020-07-09 DIAGNOSIS — Z86018 Personal history of other benign neoplasm: Secondary | ICD-10-CM | POA: Insufficient documentation

## 2020-07-09 DIAGNOSIS — Z5181 Encounter for therapeutic drug level monitoring: Secondary | ICD-10-CM | POA: Insufficient documentation

## 2020-07-09 DIAGNOSIS — E1140 Type 2 diabetes mellitus with diabetic neuropathy, unspecified: Secondary | ICD-10-CM | POA: Diagnosis not present

## 2020-07-09 MED ORDER — OXYCODONE HCL 15 MG PO TABS: 15.0000 mg | ORAL_TABLET | Freq: Three times a day (TID) | ORAL | 0 refills | Status: DC | PRN

## 2020-07-09 NOTE — Progress Notes (Signed)
Subjective:    Patient ID: Troy [name], male    DOB: December 18, 1945, 75 y.o.   MRN: [id number]  HPI: Troy [name] is a 75 y.o. male who returns for follow up appointment for chronic pain and medication refill. He states his pain is located in his right foot with numbness and burning pain. He rates his pain 9. His  current exercise regime is walking and performing yard work.   Mr. Caliendo Morphine equivalent is 67.50 MME.  Last UDS was Performed 03/05/2020, it was consistent.    Pain Inventory Average Pain 9 Pain Right Now 9 My pain is constant, sharp and aching  In the last 24 hours, has pain interfered with the following? General activity 10 Relation with others 10 Enjoyment of life 10 What TIME of day is your pain at its worst? daytime, evening and night Sleep (in general) Fair  Pain is worse with: walking, bending, sitting, standing and some activites Pain improves with: rest and medication Relief from Meds: 8  Family History  Problem Relation Age of Onset  . Hypertension Mother   . Diabetes Mother   . Stroke Mother        multiple  . Hypertension Father   . Stroke Father   . Stroke Sister   . Stroke Daughter    Social History   Socioeconomic History  . Marital status: Married    Spouse name: Not on file  . Number of children: Not on file  . Years of education: Not on file  . Highest education level: Not on file  Occupational History  . Not on file  Tobacco Use  . Smoking status: Never Smoker  . Smokeless tobacco: Never Used  Vaping Use  . Vaping Use: Never used  Substance and Sexual Activity  . Alcohol use: No    Alcohol/week: 0.0 standard drinks  . Drug use: No  . Sexual activity: Not on file  Other Topics Concern  . Not on file  Social History Narrative  . Not on file   Social Determinants of Health   Financial Resource Strain: Not on file  Food Insecurity: Not on file  Transportation Needs: Not on file  Physical Activity: Not on file  Stress:  Not on file  Social Connections: Not on file   Past Surgical History:  Procedure Laterality Date  . CARDIAC SURGERY  2003   stent  . IMPLANTATION VAGAL NERVE STIMULATOR    . TRIGGER FINGER RELEASE    . TUMOR REMOVAL  2002   S1 tumor removal by Dr. Annette Stable  . VAGAL NERVE STIMULATOR REMOVAL     Past Surgical History:  Procedure Laterality Date  . CARDIAC SURGERY  2003   stent  . IMPLANTATION VAGAL NERVE STIMULATOR    . TRIGGER FINGER RELEASE    . TUMOR REMOVAL  2002   S1 tumor removal by Dr. Annette Stable  . VAGAL NERVE STIMULATOR REMOVAL     Past Medical History:  Diagnosis Date  . Arthritis   . Diabetes mellitus   . High blood pressure   . Plantar fasciitis    BP (!) 163/79   Pulse 63   Temp 98.6 F (37 C)   Ht 5\' 8"  (1.727 m)   Wt 160 lb (72.6 kg)   SpO2 96%   BMI 24.33 kg/m   Opioid Risk Score:   Fall Risk Score:  `1  Depression screen [facility] 2/9  Depression screen [facility] 2/9 04/06/2020 01/23/2020 10/22/2019 07/24/2019 06/25/2019 02/24/2019 01/29/2018  Decreased Interest 0 0 0 1 1 1  0  Down, Depressed, Hopeless 0 0 0 1 1 1 1   PHQ - 2 Score 0 0 0 2 2 2 1   Altered sleeping - - - - - - -  Tired, decreased energy - - - - - - -  Change in appetite - - - - - - -  Feeling bad or failure about yourself  - - - - - - -  Trouble concentrating - - - - - - -  Moving slowly or fidgety/restless - - - - - - -  Suicidal thoughts - - - - - - -  PHQ-9 Score - - - - - - -    Review of Systems  Constitutional: Negative.   HENT: Negative.   Eyes: Negative.   Respiratory: Negative.   Cardiovascular: Negative.   Gastrointestinal: Negative.   Endocrine: Negative.   Genitourinary: Negative.   Musculoskeletal: Negative.   Skin: Negative.   Allergic/Immunologic: Negative.   Neurological: Negative.   Hematological: Negative.   Psychiatric/Behavioral: Negative.   All other systems reviewed and are negative.      Objective:   Physical Exam Vitals and nursing note reviewed.  Constitutional:       Appearance: Normal appearance.  Cardiovascular:     Rate and Rhythm: Normal rate and regular rhythm.     Pulses: Normal pulses.     Heart sounds: Normal heart sounds.  Pulmonary:     Effort: Pulmonary effort is normal.     Breath sounds: Normal breath sounds.  Musculoskeletal:     Cervical back: Normal range of motion and neck supple.     Comments: Normal Muscle Bulk and Muscle Testing Reveals:  Upper Extremities:Full  ROM and Muscle Strength 5/5  Lower Extremities: Full ROM and Muscle Strength 5/5 Right Lower Extremity Flexion Produces Pain into his Right Foot Arises from chair with ease Narrow Based  Gait   Skin:    General: Skin is warm and dry.  Neurological:     Mental Status: He is alert and oriented to person, place, and time.  Psychiatric:        Mood and Affect: Mood normal.        Behavior: Behavior normal.           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  We will continue the opioid monitoring program, this consists of regular clinic visits, examinations, urine drug screen, pill counts as well as use of [place] Controlled Substance Reporting system. A 12 month History has been reviewed on the [place] Controlled Substance Reporting Systemon05/20/2022. 2. Diabetic peripheral neuropathy affecting both feet: Continue current medication regimen with Gabapentin .07/09/2020 3. Chronic Low Back Pain:No complaints today.Continue current medication regimen. Continue HEP as Tolerated. Continue to Monitor.07/09/2020  F/U in 1 month

## 2020-08-04 ENCOUNTER — Encounter: Payer: Medicare Other | Attending: Physical Medicine & Rehabilitation | Admitting: Registered Nurse

## 2020-08-04 ENCOUNTER — Other Ambulatory Visit: Payer: Self-pay

## 2020-08-04 ENCOUNTER — Encounter: Payer: Self-pay | Admitting: Registered Nurse

## 2020-08-04 VITALS — BP 120/68 | HR 64 | Temp 98.0°F | Ht 68.0 in | Wt 159.0 lb

## 2020-08-04 DIAGNOSIS — Z79891 Long term (current) use of opiate analgesic: Secondary | ICD-10-CM | POA: Diagnosis present

## 2020-08-04 DIAGNOSIS — E1140 Type 2 diabetes mellitus with diabetic neuropathy, unspecified: Secondary | ICD-10-CM | POA: Diagnosis not present

## 2020-08-04 DIAGNOSIS — E1149 Type 2 diabetes mellitus with other diabetic neurological complication: Secondary | ICD-10-CM | POA: Insufficient documentation

## 2020-08-04 DIAGNOSIS — Z86018 Personal history of other benign neoplasm: Secondary | ICD-10-CM

## 2020-08-04 DIAGNOSIS — Z5181 Encounter for therapeutic drug level monitoring: Secondary | ICD-10-CM | POA: Insufficient documentation

## 2020-08-04 DIAGNOSIS — G894 Chronic pain syndrome: Secondary | ICD-10-CM | POA: Diagnosis not present

## 2020-08-04 MED ORDER — OXYCODONE HCL 15 MG PO TABS: 15.0000 mg | ORAL_TABLET | Freq: Three times a day (TID) | ORAL | 0 refills | Status: DC | PRN

## 2020-08-04 NOTE — Progress Notes (Signed)
Subjective:    Patient ID: Troy [name], male    DOB: 12-18-1945, 75 y.o.   MRN: [id number]  HPI: Troy [name] is a 75 y.o. male who returns for follow up appointment for chronic pain and medication refill. He states his  pain is located in his right foot with tingling and numbness. He rates his pain 9. His current exercise regime is walking and outside chores.  Troy [name] Morphine equivalent is 67.50  MME.  Last UDS was Performed on 03/05/2020, it was consistent.     Pain Inventory Average Pain 10 Pain Right Now 9 My pain is constant, aching, and numbness  In the last 24 hours, has pain interfered with the following? General activity 9 Relation with others 9 Enjoyment of life 10 What TIME of day is your pain at its worst? morning , daytime, and evening Sleep (in general) Good  Pain is worse with: walking and some activites Pain improves with: rest and medication Relief from Meds: 9  Family History  Problem Relation Age of Onset   Hypertension Mother    Diabetes Mother    Stroke Mother        multiple   Hypertension Father    Stroke Father    Stroke Sister    Stroke Daughter    Social History   Socioeconomic History   Marital status: Married    Spouse name: Not on file   Number of children: Not on file   Years of education: Not on file   Highest education level: Not on file  Occupational History   Not on file  Tobacco Use   Smoking status: Never   Smokeless tobacco: Never  Vaping Use   Vaping Use: Never used  Substance and Sexual Activity   Alcohol use: No    Alcohol/week: 0.0 standard drinks   Drug use: No   Sexual activity: Not on file  Other Topics Concern   Not on file  Social History Narrative   Not on file   Social Determinants of Health   Financial Resource Strain: Not on file  Food Insecurity: Not on file  Transportation Needs: Not on file  Physical Activity: Not on file  Stress: Not on file  Social Connections: Not on file   Past  Surgical History:  Procedure Laterality Date   CARDIAC SURGERY  2003   stent   [place]   S1 tumor removal by Troy [name]   VAGAL NERVE STIMULATOR REMOVAL     Past Surgical History:  Procedure Laterality Date   CARDIAC SURGERY  2003   stent   IMPLANTATION VAGAL NERVE STIMULATOR     TRIGGER FINGER RELEASE     TUMOR REMOVAL  2002   S1 tumor removal by Troy [name]   VAGAL NERVE STIMULATOR REMOVAL     Past Medical History:  Diagnosis Date   Arthritis    Diabetes mellitus    High blood pressure    Plantar fasciitis    BP 120/68 (BP Location: Right Arm, Patient Position: Sitting, Cuff Size: Normal)   Pulse (!) 59   Temp 98 F (36.7 C) (Oral)   Ht 5\' 8"  (1.727 m)   Wt 159 lb (72.1 kg)   SpO2 98%   BMI 24.18 kg/m   Opioid Risk Score:   Fall Risk Score:  `1  Depression screen PHQ 2/9  Depression screen [facility] 2/9  04/06/2020 01/23/2020 10/22/2019 07/24/2019 06/25/2019 02/24/2019 01/29/2018  Decreased Interest 0 0 0 1 1 1  0  Down, Depressed, Hopeless 0 0 0 1 1 1 1   PHQ - 2 Score 0 0 0 2 2 2 1   Altered sleeping - - - - - - -  Tired, decreased energy - - - - - - -  Change in appetite - - - - - - -  Feeling bad or failure about yourself  - - - - - - -  Trouble concentrating - - - - - - -  Moving slowly or fidgety/restless - - - - - - -  Suicidal thoughts - - - - - - -  PHQ-9 Score - - - - - - -      Review of Systems  All other systems reviewed and are negative.     Objective:   Physical Exam Vitals and nursing note reviewed.  Constitutional:      Appearance: Normal appearance.  Cardiovascular:     Rate and Rhythm: Normal rate and regular rhythm.     Pulses: Normal pulses.     Heart sounds: Normal heart sounds.  Pulmonary:     Effort: Pulmonary effort is normal.     Breath sounds: Normal breath sounds.  Musculoskeletal:     Cervical back: Normal range of motion and neck supple.     Comments: Normal  Muscle Bulk and Muscle Testing Reveals:  Upper Extremities: Full ROM and Muscle Strength 5/5 Lower Extremities: Full ROM and Muscle Strength 5/5 Right Lower Extremity Flexion Produces Pain into Right Foot Arises from chair with ease Narrow Based  Gait     Skin:    General: Skin is warm and dry.  Neurological:     Mental Status: He is alert and oriented to person, place, and time.  Psychiatric:        Mood and Affect: Mood normal.        Behavior: Behavior normal.         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   We will continue the opioid monitoring program, this consists of regular clinic visits, examinations, urine drug screen, pill counts as well as use of [name] Controlled Substance Reporting system. A 12 month History has been reviewed on the [name] Controlled Substance Reporting System on 08/04/2020.  2. Diabetic peripheral neuropathy affecting both feet:  Continue current medication regimen with Gabapentin .08/04/2020 3. Chronic Low Back Pain: No complaints today.Continue current medication regimen. Continue HEP as Tolerated. Continue to Monitor. 08/04/2020   F/U in 1 month

## 2020-09-01 ENCOUNTER — Ambulatory Visit: Payer: Medicare Other | Admitting: Registered Nurse

## 2020-09-02 ENCOUNTER — Encounter: Payer: Medicare Other | Admitting: Registered Nurse

## 2020-09-08 ENCOUNTER — Encounter: Payer: Self-pay | Admitting: Registered Nurse

## 2020-09-08 ENCOUNTER — Encounter: Payer: Medicare Other | Attending: Physical Medicine & Rehabilitation | Admitting: Registered Nurse

## 2020-09-08 ENCOUNTER — Other Ambulatory Visit: Payer: Self-pay

## 2020-09-08 VITALS — BP 160/89 | HR 65 | Temp 98.0°F | Ht 68.0 in | Wt 159.2 lb

## 2020-09-08 DIAGNOSIS — E1149 Type 2 diabetes mellitus with other diabetic neurological complication: Secondary | ICD-10-CM | POA: Insufficient documentation

## 2020-09-08 DIAGNOSIS — G894 Chronic pain syndrome: Secondary | ICD-10-CM | POA: Insufficient documentation

## 2020-09-08 DIAGNOSIS — Z79891 Long term (current) use of opiate analgesic: Secondary | ICD-10-CM

## 2020-09-08 DIAGNOSIS — E1140 Type 2 diabetes mellitus with diabetic neuropathy, unspecified: Secondary | ICD-10-CM | POA: Insufficient documentation

## 2020-09-08 DIAGNOSIS — Z86018 Personal history of other benign neoplasm: Secondary | ICD-10-CM | POA: Diagnosis present

## 2020-09-08 DIAGNOSIS — Z5181 Encounter for therapeutic drug level monitoring: Secondary | ICD-10-CM | POA: Insufficient documentation

## 2020-09-08 MED ORDER — OXYCODONE HCL 15 MG PO TABS: 15.0000 mg | ORAL_TABLET | Freq: Three times a day (TID) | ORAL | 0 refills | Status: DC | PRN

## 2020-09-08 NOTE — Progress Notes (Signed)
Subjective:    Patient ID: Troy [name], male    DOB: 01-21-1946, 75 y.o.   MRN: [id number]  HPI: Troy [name] is a 75 y.o. male who returns for follow up appointment for chronic pain and medication refill. He states his pain is located in his right foot. He rates his pain 9. His current exercise regime is walking and performing stretching exercises.   Mr. Wildes Morphine equivalent is 58.50  MME.   UDS ordered today.   Pain Inventory Average Pain 9 Pain Right Now 9 My pain is constant, sharp, burning, and aching  In the last 24 hours, has pain interfered with the following? General activity 9 Relation with others 9 Enjoyment of life 9 What TIME of day is your pain at its worst? daytime and evening Sleep (in general) Good  Pain is worse with: walking, inactivity, and some activites Pain improves with: rest and medication Relief from Meds: 9  Family History  Problem Relation Age of Onset   Hypertension Mother    Diabetes Mother    Stroke Mother        multiple   Hypertension Father    Stroke Father    Stroke Sister    Stroke Daughter    Social History   Socioeconomic History   Marital status: Married    Spouse name: Not on file   Number of children: Not on file   Years of education: Not on file   Highest education level: Not on file  Occupational History   Not on file  Tobacco Use   Smoking status: Never   Smokeless tobacco: Never  Vaping Use   Vaping Use: Never used  Substance and Sexual Activity   Alcohol use: No    Alcohol/week: 0.0 standard drinks   Drug use: No   Sexual activity: Not on file  Other Topics Concern   Not on file  Social History Narrative   Not on file   Social Determinants of Health   Financial Resource Strain: Not on file  Food Insecurity: Not on file  Transportation Needs: Not on file  Physical Activity: Not on file  Stress: Not on file  Social Connections: Not on file   Past Surgical History:  Procedure Laterality Date    CARDIAC SURGERY  2003   stent   [place]   S1 tumor removal by Dr. Annette Stable   VAGAL NERVE STIMULATOR REMOVAL     Past Surgical History:  Procedure Laterality Date   CARDIAC SURGERY  2003   stent   IMPLANTATION VAGAL NERVE STIMULATOR     TRIGGER FINGER RELEASE     TUMOR REMOVAL  2002   S1 tumor removal by Dr. Annette Stable   VAGAL NERVE STIMULATOR REMOVAL     Past Medical History:  Diagnosis Date   Arthritis    Diabetes mellitus    High blood pressure    Plantar fasciitis    BP (!) 160/60   Pulse 65   Temp 98 F (36.7 C)   Ht 5\' 8"  (1.727 m)   Wt 159 lb 3.2 oz (72.2 kg)   SpO2 97%   BMI 24.21 kg/m   Opioid Risk Score:   Fall Risk Score:  `1  Depression screen PHQ 2/9  Depression screen [facility] 2/9 04/06/2020 01/23/2020 10/22/2019 07/24/2019 06/25/2019 02/24/2019 01/29/2018  Decreased Interest 0 0 0 1 1 1  0  Down,  Depressed, Hopeless 0 0 0 1 1 1 1   PHQ - 2 Score 0 0 0 2 2 2 1   Altered sleeping - - - - - - -  Tired, decreased energy - - - - - - -  Change in appetite - - - - - - -  Feeling bad or failure about yourself  - - - - - - -  Trouble concentrating - - - - - - -  Moving slowly or fidgety/restless - - - - - - -  Suicidal thoughts - - - - - - -  PHQ-9 Score - - - - - - -     Review of Systems  Constitutional: Negative.   HENT: Negative.    Eyes: Negative.   Respiratory: Negative.    Cardiovascular: Negative.   Gastrointestinal: Negative.   Endocrine: Negative.   Genitourinary: Negative.   Musculoskeletal:        Right foot pain  Skin: Negative.   Allergic/Immunologic: Negative.   Neurological: Negative.   Hematological: Negative.   Psychiatric/Behavioral: Negative.    All other systems reviewed and are negative.     Objective:   Physical Exam Vitals and nursing note reviewed.  Constitutional:      Appearance: Normal appearance.  Cardiovascular:     Rate and Rhythm: Normal rate and  regular rhythm.     Pulses: Normal pulses.     Heart sounds: Normal heart sounds.  Pulmonary:     Effort: Pulmonary effort is normal.     Breath sounds: Normal breath sounds.  Musculoskeletal:     Cervical back: Normal range of motion and neck supple.     Comments: Normal Muscle Bulk and Muscle Testing Reveals:  Upper Extremities: Full ROM and Muscle Strength  5/5 Lower Extremities: Full ROM and Muscle Strength 5/5 Right Lower Extremity Flexion Produces Pain into his right foot Arises from chair with ease Narrow Based  Gait     Skin:    General: Skin is warm and dry.  Neurological:     Mental Status: He is alert and oriented to person, place, and time.  Psychiatric:        Mood and Affect: Mood normal.        Behavior: Behavior normal.         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   We will continue the opioid monitoring program, this consists of regular clinic visits, examinations, urine drug screen, pill counts as well as use of [place] Controlled Substance Reporting system. A 12 month History has been reviewed on the [place] Controlled Substance Reporting System on 09/08/2020.  2. Diabetic peripheral neuropathy affecting both feet:  Continue current medication regimen with Gabapentin .09/08/2020 3. Chronic Low Back Pain: No complaints today.Continue current medication regimen. Continue HEP as Tolerated. Continue to Monitor. 09/08/2020   F/U in 1 month

## 2020-09-12 LAB — TOXASSURE SELECT,+ANTIDEPR,UR

## 2020-09-16 ENCOUNTER — Telehealth: Payer: Self-pay | Admitting: *Deleted

## 2020-09-16 NOTE — Telephone Encounter (Signed)
Urine drug screen for this encounter is consistent for prescribed medication 

## 2020-09-30 ENCOUNTER — Encounter: Payer: Self-pay | Admitting: Registered Nurse

## 2020-09-30 ENCOUNTER — Other Ambulatory Visit: Payer: Self-pay

## 2020-09-30 ENCOUNTER — Encounter: Payer: Medicare Other | Attending: Physical Medicine & Rehabilitation | Admitting: Registered Nurse

## 2020-09-30 VITALS — BP 104/58 | HR 70 | Temp 97.6°F | Ht 67.0 in | Wt 152.6 lb

## 2020-09-30 DIAGNOSIS — E1140 Type 2 diabetes mellitus with diabetic neuropathy, unspecified: Secondary | ICD-10-CM | POA: Diagnosis present

## 2020-09-30 DIAGNOSIS — G894 Chronic pain syndrome: Secondary | ICD-10-CM | POA: Diagnosis present

## 2020-09-30 DIAGNOSIS — Z86018 Personal history of other benign neoplasm: Secondary | ICD-10-CM | POA: Insufficient documentation

## 2020-09-30 DIAGNOSIS — E1149 Type 2 diabetes mellitus with other diabetic neurological complication: Secondary | ICD-10-CM | POA: Diagnosis present

## 2020-09-30 DIAGNOSIS — Z5181 Encounter for therapeutic drug level monitoring: Secondary | ICD-10-CM | POA: Insufficient documentation

## 2020-09-30 DIAGNOSIS — Z79891 Long term (current) use of opiate analgesic: Secondary | ICD-10-CM | POA: Diagnosis present

## 2020-09-30 MED ORDER — GABAPENTIN 400 MG PO CAPS: ORAL_CAPSULE | ORAL | 2 refills | Status: DC

## 2020-09-30 MED ORDER — OXYCODONE HCL 15 MG PO TABS: 15.0000 mg | ORAL_TABLET | Freq: Three times a day (TID) | ORAL | 0 refills | Status: DC | PRN

## 2020-09-30 NOTE — Progress Notes (Signed)
Subjective:    Patient ID: Troy [name], male    DOB: 1945-09-04, 75 y.o.   MRN: [id number]  HPI: Troy [name] is a 75 y.o. male who returns for follow up appointment for chronic pain and medication refill. He states his pain is located in  his right foot with numbness.He rates his pain 9. His current exercise regime is walking and performing outside chores.  Troy [name] Morphine equivalent is 67.50 MME.   Last UDS was Performed on 09/08/2020, it was consistent    Pain Inventory Average Pain 9 Pain Right Now 9 My pain is aching  In the last 24 hours, has pain interfered with the following? General activity 8 Relation with others 9 Enjoyment of life 9 What TIME of day is your pain at its worst? daytime and evening Sleep (in general) Good  Pain is worse with: walking and some activites Pain improves with: rest and medication Relief from Meds: 9  Family History  Problem Relation Age of Onset   Hypertension Mother    Diabetes Mother    Stroke Mother        multiple   Hypertension Father    Stroke Father    Stroke Sister    Stroke Daughter    Social History   Socioeconomic History   Marital status: Married    Spouse name: Not on file   Number of children: Not on file   Years of education: Not on file   Highest education level: Not on file  Occupational History   Not on file  Tobacco Use   Smoking status: Never   Smokeless tobacco: Never  Vaping Use   Vaping Use: Never used  Substance and Sexual Activity   Alcohol use: No    Alcohol/week: 0.0 standard drinks   Drug use: No   Sexual activity: Not on file  Other Topics Concern   Not on file  Social History Narrative   Not on file   Social Determinants of Health   Financial Resource Strain: Not on file  Food Insecurity: Not on file  Transportation Needs: Not on file  Physical Activity: Not on file  Stress: Not on file  Social Connections: Not on file   Past Surgical History:  Procedure Laterality Date    CARDIAC SURGERY  2003   stent   [place]   S1 tumor removal by Dr. Annette [name]   VAGAL NERVE STIMULATOR REMOVAL     Past Surgical History:  Procedure Laterality Date   CARDIAC SURGERY  2003   stent   IMPLANTATION VAGAL NERVE STIMULATOR     TRIGGER FINGER RELEASE     TUMOR REMOVAL  2002   S1 tumor removal by Dr. Annette [name]   VAGAL NERVE STIMULATOR REMOVAL     Past Medical History:  Diagnosis Date   Arthritis    Diabetes mellitus    High blood pressure    Plantar fasciitis    BP (!) 104/58 Comment (BP Location): Left arm Comment (Patient Position): Sitting Comment (Cuff Size): normal  Pulse 70   Temp 97.6 F (36.4 C)   Ht '5\' 7"'$  (1.702 m)   Wt 152 lb 9.6 oz (69.2 kg)   SpO2 96%   BMI 23.90 kg/m   Opioid Risk Score:   Fall Risk Score:  `1  Depression screen [facility] 2/9  Depression screen [facility] 2/9 04/06/2020 01/23/2020 10/22/2019 07/24/2019 06/25/2019  02/24/2019 01/29/2018  Decreased Interest 0 0 0 '1 1 1 '$ 0  Down, Depressed, Hopeless 0 0 0 '1 1 1 1  '$ PHQ - 2 Score 0 0 0 '2 2 2 1  '$ Altered sleeping - - - - - - -  Tired, decreased energy - - - - - - -  Change in appetite - - - - - - -  Feeling bad or failure about yourself  - - - - - - -  Trouble concentrating - - - - - - -  Moving slowly or fidgety/restless - - - - - - -  Suicidal thoughts - - - - - - -  PHQ-9 Score - - - - - - -    Review of Systems  Constitutional: Negative.   HENT: Negative.    Eyes: Negative.   Respiratory: Negative.    Cardiovascular: Negative.   Gastrointestinal: Negative.   Endocrine: Negative.   Genitourinary: Negative.   Musculoskeletal:  Positive for gait problem.       AIN IN RIGHT FOOT  Skin: Negative.   Allergic/Immunologic: Negative.   Hematological: Negative.   Psychiatric/Behavioral: Negative.        Objective:   Physical Exam Vitals and nursing note reviewed.  Constitutional:      Appearance: Normal appearance.   Cardiovascular:     Rate and Rhythm: Normal rate and regular rhythm.     Pulses: Normal pulses.     Heart sounds: Normal heart sounds.  Pulmonary:     Effort: Pulmonary effort is normal.     Breath sounds: Normal breath sounds.  Musculoskeletal:     Cervical back: Normal range of motion and neck supple.     Comments: Normal Muscle Bulk and Muscle Testing Reveals:  Upper Extremities: Full ROM and Muscle Strength 5/5  Lower Extremities: Full ROM and Muscle Strength 5/5 Arises from chair with ease Narrow Based  Gait     Skin:    General: Skin is warm and dry.  Neurological:     Mental Status: He is alert and oriented to person, place, and time.  Psychiatric:        Mood and Affect: Mood normal.        Behavior: Behavior normal.         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   We will continue the opioid monitoring program, this consists of regular clinic visits, examinations, urine drug screen, pill counts as well as use of [place] Controlled Substance Reporting system. A 12 month History has been reviewed on the [place] Controlled Substance Reporting System on 09/30/2020.  2. Diabetic peripheral neuropathy affecting both feet:  Continue current medication regimen with Gabapentin .09/30/2020 3. Chronic Low Back Pain: No complaints today.Continue current medication regimen. Continue HEP as Tolerated. Continue to Monitor. 09/30/2020   F/U in 1 month

## 2020-11-04 ENCOUNTER — Encounter: Payer: Self-pay | Admitting: Registered Nurse

## 2020-11-04 ENCOUNTER — Other Ambulatory Visit: Payer: Self-pay

## 2020-11-04 ENCOUNTER — Encounter: Payer: Medicare Other | Attending: Physical Medicine & Rehabilitation | Admitting: Registered Nurse

## 2020-11-04 VITALS — BP 126/61 | HR 62 | Temp 98.0°F | Ht 67.0 in | Wt 157.0 lb

## 2020-11-04 DIAGNOSIS — Z86018 Personal history of other benign neoplasm: Secondary | ICD-10-CM | POA: Diagnosis not present

## 2020-11-04 DIAGNOSIS — E1140 Type 2 diabetes mellitus with diabetic neuropathy, unspecified: Secondary | ICD-10-CM

## 2020-11-04 DIAGNOSIS — E1149 Type 2 diabetes mellitus with other diabetic neurological complication: Secondary | ICD-10-CM | POA: Diagnosis present

## 2020-11-04 DIAGNOSIS — G894 Chronic pain syndrome: Secondary | ICD-10-CM | POA: Diagnosis not present

## 2020-11-04 DIAGNOSIS — Z79891 Long term (current) use of opiate analgesic: Secondary | ICD-10-CM | POA: Diagnosis present

## 2020-11-04 DIAGNOSIS — Z5181 Encounter for therapeutic drug level monitoring: Secondary | ICD-10-CM

## 2020-11-04 MED ORDER — OXYCODONE HCL 15 MG PO TABS: 15.0000 mg | ORAL_TABLET | Freq: Three times a day (TID) | ORAL | 0 refills | Status: DC | PRN

## 2020-11-04 NOTE — Progress Notes (Signed)
Subjective:    Patient ID: Troy [name], male    DOB: 1945-07-21, 75 y.o.   MRN: [id number]  HPI: Troy [name] is a 75 y.o. male who returns for follow up appointment for chronic pain and medication refill. He states his  pain is located in his right foot.Marland KitchenHe rates his pain 9. His current exercise regime is walking and performing stretching exercises.  Troy [name] Morphine equivalent is 67.50 MME.   Last UDS was Performed on 09/08/2020, it was consistent.    Pain Inventory Average Pain 9 Pain Right Now 9 My pain is aching  In the last 24 hours, has pain interfered with the following? General activity 8 Relation with others 8 Enjoyment of life 8 What TIME of day is your pain at its worst? daytime and evening Sleep (in general) Good  Pain is worse with: walking, bending, and some activites Pain improves with: rest and medication Relief from Meds: 9  Family History  Problem Relation Age of Onset   Hypertension Mother    Diabetes Mother    Stroke Mother        multiple   Hypertension Father    Stroke Father    Stroke Sister    Stroke Daughter    Social History   Socioeconomic History   Marital status: Married    Spouse name: Not on file   Number of children: Not on file   Years of education: Not on file   Highest education level: Not on file  Occupational History   Not on file  Tobacco Use   Smoking status: Never   Smokeless tobacco: Never  Vaping Use   Vaping Use: Never used  Substance and Sexual Activity   Alcohol use: No    Alcohol/week: 0.0 standard drinks   Drug use: No   Sexual activity: Not on file  Other Topics Concern   Not on file  Social History Narrative   Not on file   Social Determinants of Health   Financial Resource Strain: Not on file  Food Insecurity: Not on file  Transportation Needs: Not on file  Physical Activity: Not on file  Stress: Not on file  Social Connections: Not on file   Past Surgical History:  Procedure Laterality  Date   CARDIAC SURGERY  2003   stent   [place]   S1 tumor removal by Dr. Annette Stable   VAGAL NERVE STIMULATOR REMOVAL     Past Surgical History:  Procedure Laterality Date   CARDIAC SURGERY  2003   stent   IMPLANTATION VAGAL NERVE STIMULATOR     TRIGGER FINGER RELEASE     TUMOR REMOVAL  2002   S1 tumor removal by Dr. Annette Stable   VAGAL NERVE STIMULATOR REMOVAL     Past Medical History:  Diagnosis Date   Arthritis    Diabetes mellitus    High blood pressure    Plantar fasciitis    BP 126/61   Pulse 62   Temp 98 F (36.7 C) (Oral)   Ht '5\' 7"'$  (1.702 m)   Wt 157 lb (71.2 kg)   SpO2 95%   BMI 24.59 kg/m   Opioid Risk Score:   Fall Risk Score:  `1  Depression screen PHQ 2/9  Depression screen [facility] 2/9 09/30/2020 04/06/2020 01/23/2020 10/22/2019 07/24/2019 06/25/2019 02/24/2019  Decreased Interest 0 0 0 0 '1 1 1  '$ Down, Depressed,  Hopeless 0 0 0 0 '1 1 1  '$ PHQ - 2 Score 0 0 0 0 '2 2 2  '$ Altered sleeping - - - - - - -  Tired, decreased energy - - - - - - -  Change in appetite - - - - - - -  Feeling bad or failure about yourself  - - - - - - -  Trouble concentrating - - - - - - -  Moving slowly or fidgety/restless - - - - - - -  Suicidal thoughts - - - - - - -  PHQ-9 Score - - - - - - -      Review of Systems     Objective:   Physical Exam        Assessment & Plan:  1.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   We will continue the opioid monitoring program, this consists of regular clinic visits, examinations, urine drug screen, pill counts as well as use of [place] Controlled Substance Reporting system. A 12 month History has been reviewed on the [place] on 11/04/2020.  2. Diabetic peripheral neuropathy affecting both feet:   Continue current medication regimen with Gabapentin .11/04/2020 3. Chronic Low Back Pain: No complaints today.Continue current medication regimen. Continue HEP as Tolerated. Continue to Monitor. 11/04/2020   F/U in 1 month

## 2020-12-02 ENCOUNTER — Other Ambulatory Visit: Payer: Self-pay

## 2020-12-02 ENCOUNTER — Encounter: Payer: Medicare Other | Attending: Physical Medicine & Rehabilitation | Admitting: Registered Nurse

## 2020-12-02 ENCOUNTER — Encounter: Payer: Self-pay | Admitting: Registered Nurse

## 2020-12-02 VITALS — BP 118/63 | HR 61 | Temp 97.8°F | Ht 67.0 in | Wt 158.0 lb

## 2020-12-02 DIAGNOSIS — Z79891 Long term (current) use of opiate analgesic: Secondary | ICD-10-CM | POA: Insufficient documentation

## 2020-12-02 DIAGNOSIS — E1149 Type 2 diabetes mellitus with other diabetic neurological complication: Secondary | ICD-10-CM | POA: Diagnosis present

## 2020-12-02 DIAGNOSIS — G894 Chronic pain syndrome: Secondary | ICD-10-CM | POA: Diagnosis not present

## 2020-12-02 DIAGNOSIS — Z5181 Encounter for therapeutic drug level monitoring: Secondary | ICD-10-CM | POA: Diagnosis not present

## 2020-12-02 DIAGNOSIS — E1140 Type 2 diabetes mellitus with diabetic neuropathy, unspecified: Secondary | ICD-10-CM | POA: Insufficient documentation

## 2020-12-02 DIAGNOSIS — Z86018 Personal history of other benign neoplasm: Secondary | ICD-10-CM | POA: Insufficient documentation

## 2020-12-02 MED ORDER — OXYCODONE HCL 15 MG PO TABS: 15.0000 mg | ORAL_TABLET | Freq: Three times a day (TID) | ORAL | 0 refills | Status: DC | PRN

## 2020-12-02 NOTE — Progress Notes (Signed)
Subjective:    Patient ID: Troy [name], male    DOB: [birth year], 75 y.o.   MRN: [id number]  HPI: Troy [name] is a 75 y.o. male who returns for follow up appointment for chronic pain and medication refill. He states his pain is located in his right foot with numbness. He rates his pain 8. His current exercise regime is walking and performing stretching exercises.  Troy [name] Morphine equivalent is 67.50 MME.   Last UDS was Performed on 09/08/2020, it was consistent.     Pain Inventory Average Pain 8 Pain Right Now 8 My pain is sharp and aching  In the last 24 hours, has pain interfered with the following? General activity 8 Relation with others 8 Enjoyment of life 8 What TIME of day is your pain at its worst? daytime and evening Sleep (in general) Good  Pain is worse with: walking and some activites Pain improves with: rest and medication Relief from Meds: 8  Family History  Problem Relation Age of Onset   Hypertension Mother    Diabetes Mother    Stroke Mother        multiple   Hypertension Father    Stroke Father    Stroke Sister    Stroke Daughter    Social History   Socioeconomic History   Marital status: Married    Spouse name: Not on file   Number of children: Not on file   Years of education: Not on file   Highest education level: Not on file  Occupational History   Not on file  Tobacco Use   Smoking status: Never   Smokeless tobacco: Never  Vaping Use   Vaping Use: Never used  Substance and Sexual Activity   Alcohol use: No    Alcohol/week: 0.0 standard drinks   Drug use: No   Sexual activity: Not on file  Other Topics Concern   Not on file  Social History Narrative   Not on file   Social Determinants of Health   Financial Resource Strain: Not on file  Food Insecurity: Not on file  Transportation Needs: Not on file  Physical Activity: Not on file  Stress: Not on file  Social Connections: Not on file   Past Surgical History:  Procedure  Laterality Date   CARDIAC SURGERY  2003   stent   [place]   S1 tumor removal by Dr. Annette Stable   VAGAL NERVE STIMULATOR REMOVAL     Past Surgical History:  Procedure Laterality Date   CARDIAC SURGERY  2003   stent   IMPLANTATION VAGAL NERVE STIMULATOR     TRIGGER FINGER RELEASE     TUMOR REMOVAL  2002   S1 tumor removal by Dr. Annette Stable   VAGAL NERVE STIMULATOR REMOVAL     Past Medical History:  Diagnosis Date   Arthritis    Diabetes mellitus    High blood pressure    Plantar fasciitis    Temp 97.8 F (36.6 C)   Ht 5\' 7"  (1.702 m)   Wt 158 lb (71.7 kg)   BMI 24.75 kg/m   Opioid Risk Score:   Fall Risk Score:  `1  Depression screen PHQ 2/9  Depression screen [facility] 2/9 12/02/2020 09/30/2020 04/06/2020 01/23/2020 10/22/2019 07/24/2019 06/25/2019  Decreased Interest 0 0 0 0 0 1 1  Down, Depressed, Hopeless 0 0 0 0 0 1 1  PHQ - 2 Score 0 0 0 0 0 2 2  Altered sleeping - - - - - - -  Tired, decreased energy - - - - - - -  Change in appetite - - - - - - -  Feeling bad or failure about yourself  - - - - - - -  Trouble concentrating - - - - - - -  Moving slowly or fidgety/restless - - - - - - -  Suicidal thoughts - - - - - - -  PHQ-9 Score - - - - - - -     Review of Systems  Constitutional: Negative.   HENT: Negative.    Eyes: Negative.   Respiratory: Negative.    Gastrointestinal: Negative.   Endocrine: Negative.   Genitourinary: Negative.   Musculoskeletal: Negative.   Skin: Negative.   Allergic/Immunologic: Negative.   Neurological: Negative.   Hematological: Negative.   Psychiatric/Behavioral: Negative.        Objective:   Physical Exam Vitals and nursing note reviewed.  Constitutional:      Appearance: Normal appearance.  Cardiovascular:     Rate and Rhythm: Normal rate and regular rhythm.     Pulses: Normal pulses.     Heart sounds: Normal heart sounds.  Pulmonary:     Effort: Pulmonary  effort is normal.     Breath sounds: Normal breath sounds.  Musculoskeletal:     Cervical back: Normal range of motion and neck supple.     Comments: Normal Muscle Bulk and Muscle Testing Reveals:  Upper Extremities: Full ROM and Muscle Strength 5/5 Lower Extremities: Full ROM and Muscle Strength 5/5 Right Lower Extremity Flexion Produces Pain into his right foot Arises from chair with ease Narrow Based Gait     Skin:    General: Skin is warm and dry.  Neurological:     Mental Status: He is alert and oriented to person, place, and time.  Psychiatric:        Mood and Affect: Mood normal.        Behavior: Behavior normal.         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   We will continue the opioid monitoring program, this consists of regular clinic visits, examinations, urine drug screen, pill counts as well as use of [place] Controlled Substance Reporting system. A 12 month History has been reviewed on the [place] Controlled Substance Reporting System on 12/02/2020.  2. Diabetic peripheral neuropathy affecting both feet:  Continue current medication regimen with Gabapentin .12/02/2020 3. Chronic Low Back Pain: No complaints today.Continue current medication regimen. Continue HEP as Tolerated. Continue to Monitor. 12/02/2020   F/U in 1 month

## 2021-01-05 ENCOUNTER — Other Ambulatory Visit: Payer: Self-pay

## 2021-01-05 ENCOUNTER — Encounter: Payer: Medicare Other | Attending: Physical Medicine & Rehabilitation | Admitting: Registered Nurse

## 2021-01-05 ENCOUNTER — Encounter: Payer: Self-pay | Admitting: Registered Nurse

## 2021-01-05 VITALS — BP 152/58 | HR 64 | Temp 99.0°F | Ht 67.0 in | Wt 163.4 lb

## 2021-01-05 DIAGNOSIS — G894 Chronic pain syndrome: Secondary | ICD-10-CM | POA: Diagnosis present

## 2021-01-05 DIAGNOSIS — Z79891 Long term (current) use of opiate analgesic: Secondary | ICD-10-CM | POA: Insufficient documentation

## 2021-01-05 DIAGNOSIS — M25552 Pain in left hip: Secondary | ICD-10-CM | POA: Insufficient documentation

## 2021-01-05 DIAGNOSIS — E1149 Type 2 diabetes mellitus with other diabetic neurological complication: Secondary | ICD-10-CM | POA: Insufficient documentation

## 2021-01-05 DIAGNOSIS — Z86018 Personal history of other benign neoplasm: Secondary | ICD-10-CM | POA: Insufficient documentation

## 2021-01-05 DIAGNOSIS — Z5181 Encounter for therapeutic drug level monitoring: Secondary | ICD-10-CM | POA: Diagnosis present

## 2021-01-05 DIAGNOSIS — E1140 Type 2 diabetes mellitus with diabetic neuropathy, unspecified: Secondary | ICD-10-CM | POA: Insufficient documentation

## 2021-01-05 MED ORDER — OXYCODONE HCL 15 MG PO TABS: 15.0000 mg | ORAL_TABLET | Freq: Three times a day (TID) | ORAL | 0 refills | Status: DC | PRN

## 2021-01-05 NOTE — Progress Notes (Signed)
Subjective:    Patient ID: Troy [name], male    DOB: August 04, 1945, 75 y.o.   MRN: [id number]  HPI: Troy [name] is a 75 y.o. male who returns for follow up appointment for chronic pain and medication refill. He states his pain is located in his Right foot and left hip. He rates his pain 9. His current exercise regime is walking short distances  Troy [name] Morphine equivalent is 67.50 MME.   UDS ordered today.     Pain Inventory Average Pain 8 Pain Right Now 9 My pain is aching  In the last 24 hours, has pain interfered with the following? General activity 8 Relation with others 8 Enjoyment of life 8 What TIME of day is your pain at its worst? daytime and evening Sleep (in general) Good  Pain is worse with: walking, bending, and some activites Pain improves with: rest and medication Relief from Meds: 8  Family History  Problem Relation Age of Onset   Hypertension Mother    Diabetes Mother    Stroke Mother        multiple   Hypertension Father    Stroke Father    Stroke Sister    Stroke Daughter    Social History   Socioeconomic History   Marital status: Married    Spouse name: Not on file   Number of children: Not on file   Years of education: Not on file   Highest education level: Not on file  Occupational History   Not on file  Tobacco Use   Smoking status: Never   Smokeless tobacco: Never  Vaping Use   Vaping Use: Never used  Substance and Sexual Activity   Alcohol use: No    Alcohol/week: 0.0 standard drinks   Drug use: No   Sexual activity: Not on file  Other Topics Concern   Not on file  Social History Narrative   Not on file   Social Determinants of Health   Financial Resource Strain: Not on file  Food Insecurity: Not on file  Transportation Needs: Not on file  Physical Activity: Not on file  Stress: Not on file  Social Connections: Not on file   Past Surgical History:  Procedure Laterality Date   CARDIAC SURGERY  2003   stent    [place]   S1 tumor removal by Dr. Annette Stable   VAGAL NERVE STIMULATOR REMOVAL     Past Surgical History:  Procedure Laterality Date   CARDIAC SURGERY  2003   stent   IMPLANTATION VAGAL NERVE STIMULATOR     TRIGGER FINGER RELEASE     TUMOR REMOVAL  2002   S1 tumor removal by Dr. Annette Stable   VAGAL NERVE STIMULATOR REMOVAL     Past Medical History:  Diagnosis Date   Arthritis    Diabetes mellitus    High blood pressure    Plantar fasciitis    BP (!) 152/58   Pulse 64   Temp 99 F (37.2 C)   Ht 5\' 7"  (1.702 m)   Wt 163 lb 6.4 oz (74.1 kg)   SpO2 97%   BMI 25.59 kg/m   Opioid Risk Score:   Fall Risk Score:  `1  Depression screen PHQ 2/9  Depression screen [facility] 2/9 01/05/2021 12/02/2020 09/30/2020 04/06/2020 01/23/2020 10/22/2019 07/24/2019  Decreased Interest 0 0 0 0 0 0 1  Down, Depressed, Hopeless 0  0 0 0 0 0 1  PHQ - 2 Score 0 0 0 0 0 0 2  Altered sleeping - - - - - - -  Tired, decreased energy - - - - - - -  Change in appetite - - - - - - -  Feeling bad or failure about yourself  - - - - - - -  Trouble concentrating - - - - - - -  Moving slowly or fidgety/restless - - - - - - -  Suicidal thoughts - - - - - - -  PHQ-9 Score - - - - - - -     Review of Systems  Constitutional: Negative.   HENT: Negative.    Eyes: Negative.   Respiratory: Negative.    Cardiovascular: Negative.   Gastrointestinal: Negative.   Endocrine: Negative.   Genitourinary: Negative.   Musculoskeletal: Negative.   Skin: Negative.   Allergic/Immunologic: Negative.   Neurological: Negative.   Hematological: Negative.   Psychiatric/Behavioral: Negative.        Objective:   Physical Exam Vitals and nursing note reviewed.  Constitutional:      Appearance: Normal appearance.  Cardiovascular:     Rate and Rhythm: Normal rate and regular rhythm.     Pulses: Normal pulses.     Heart sounds: Normal heart sounds.   Pulmonary:     Effort: Pulmonary effort is normal.     Breath sounds: Normal breath sounds.  Musculoskeletal:     Cervical back: Normal range of motion and neck supple.     Comments: Normal Muscle Bulk and Muscle Testing Reveals:  Upper Extremities: Full ROM and Muscle Strength 5/5 Lower Extremities: Full ROM and Muscle Strength 5/5 Arises from chair with ease Narrow Based  Gait     Skin:    General: Skin is warm and dry.  Neurological:     Mental Status: He is alert and oriented to person, place, and time.  Psychiatric:        Mood and Affect: Mood normal.        Behavior: Behavior normal.         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   We will continue the opioid monitoring program, this consists of regular clinic visits, examinations, urine drug screen, pill counts as well as use of [place] Controlled Substance Reporting system. A 12 month History has been reviewed on the [place] on 01/05/2021.  2. Diabetic peripheral neuropathy affecting both feet:  Continue current medication regimen with Gabapentin .01/05/2021 3. Chronic Low Back Pain: No complaints today.Continue current medication regimen. Continue HEP as Tolerated. Continue to Monitor. 01/05/2021 4. Left Hip Pain: Troy [name] refuses X-ray, he states he will F/U with his PCP   F/U in 1 month

## 2021-01-11 LAB — TOXASSURE SELECT,+ANTIDEPR,UR

## 2021-01-19 ENCOUNTER — Telehealth: Payer: Self-pay | Admitting: *Deleted

## 2021-01-19 NOTE — Telephone Encounter (Signed)
Urine drug screen for this encounter is consistent for prescribed medication 

## 2021-02-02 ENCOUNTER — Encounter: Payer: Medicare Other | Admitting: Registered Nurse

## 2021-02-03 ENCOUNTER — Encounter: Payer: Self-pay | Admitting: Registered Nurse

## 2021-02-03 ENCOUNTER — Encounter: Payer: Medicare Other | Attending: Physical Medicine & Rehabilitation | Admitting: Registered Nurse

## 2021-02-03 ENCOUNTER — Other Ambulatory Visit: Payer: Self-pay

## 2021-02-03 VITALS — BP 167/65 | HR 66 | Temp 98.3°F | Ht 67.0 in | Wt 165.0 lb

## 2021-02-03 DIAGNOSIS — E1140 Type 2 diabetes mellitus with diabetic neuropathy, unspecified: Secondary | ICD-10-CM | POA: Insufficient documentation

## 2021-02-03 DIAGNOSIS — E1149 Type 2 diabetes mellitus with other diabetic neurological complication: Secondary | ICD-10-CM | POA: Diagnosis present

## 2021-02-03 DIAGNOSIS — Z86018 Personal history of other benign neoplasm: Secondary | ICD-10-CM | POA: Diagnosis not present

## 2021-02-03 DIAGNOSIS — Z79891 Long term (current) use of opiate analgesic: Secondary | ICD-10-CM | POA: Diagnosis not present

## 2021-02-03 DIAGNOSIS — G894 Chronic pain syndrome: Secondary | ICD-10-CM | POA: Insufficient documentation

## 2021-02-03 DIAGNOSIS — Z5181 Encounter for therapeutic drug level monitoring: Secondary | ICD-10-CM | POA: Diagnosis not present

## 2021-02-03 MED ORDER — OXYCODONE HCL 15 MG PO TABS: 15.0000 mg | ORAL_TABLET | Freq: Three times a day (TID) | ORAL | 0 refills | Status: DC | PRN

## 2021-02-03 NOTE — Progress Notes (Signed)
Subjective:    Patient ID: Troy [name], male    DOB: [birth year], 75 y.o.   MRN: [id number]  HPI: Troy [name] is a 75 y.o. male who returns for follow up appointment for chronic pain and medication refill. He states his pain is located in his right foot. He rates his pain 8. His current exercise regime is walking and performing stretching exercises.  [name] Morphine equivalent is 67.50 MME.   Last UDS was Performed on 01/05/2021, it was consistent.      Pain Inventory Average Pain 8 Pain Right Now 8 My pain is intermittent and aching, numb  In the last 24 hours, has pain interfered with the following? General activity 8 Relation with others 8 Enjoyment of life 9 What TIME of day is your pain at its worst? daytime and evening Sleep (in general) Good  Pain is worse with: walking Pain improves with: rest and medication Relief from Meds:  good  Family History  Problem Relation Age of Onset   Hypertension Mother    Diabetes Mother    Stroke Mother        multiple   Hypertension Father    Stroke Father    Stroke Sister    Stroke Daughter    Social History   Socioeconomic History   Marital status: Married    Spouse name: Not on file   Number of children: Not on file   Years of education: Not on file   Highest education level: Not on file  Occupational History   Not on file  Tobacco Use   Smoking status: Never   Smokeless tobacco: Never  Vaping Use   Vaping Use: Never used  Substance and Sexual Activity   Alcohol use: No    Alcohol/week: 0.0 standard drinks   Drug use: No   Sexual activity: Not on file  Other Topics Concern   Not on file  Social History Narrative   Not on file   Social Determinants of Health   Financial Resource Strain: Not on file  Food Insecurity: Not on file  Transportation Needs: Not on file  Physical Activity: Not on file  Stress: Not on file  Social Connections: Not on file   Past Surgical History:  Procedure Laterality  Date   CARDIAC SURGERY  2003   stent   [place]   S1 tumor removal by Dr. Annette Stable   VAGAL NERVE STIMULATOR REMOVAL     Past Surgical History:  Procedure Laterality Date   CARDIAC SURGERY  2003   stent   IMPLANTATION VAGAL NERVE STIMULATOR     TRIGGER FINGER RELEASE     TUMOR REMOVAL  2002   S1 tumor removal by Dr. Annette Stable   VAGAL NERVE STIMULATOR REMOVAL     Past Medical History:  Diagnosis Date   Arthritis    Diabetes mellitus    High blood pressure    Plantar fasciitis    Pulse 66    Temp 98.3 F (36.8 C)    Ht 5\' 7"  (1.702 m)    Wt 165 lb (74.8 kg)    SpO2 95%    BMI 25.84 kg/m   Opioid Risk Score:   Fall Risk Score:  `1  Depression screen PHQ 2/9  Depression screen [facility] 2/9 02/03/2021 01/05/2021 12/02/2020 09/30/2020 04/06/2020 01/23/2020 10/22/2019  Decreased Interest 0 0 0 0 0 0 0  Down, Depressed, Hopeless 0 0 0 0 0 0 0  PHQ - 2 Score 0 0 0 0 0 0 0  Altered sleeping - - - - - - -  Tired, decreased energy - - - - - - -  Change in appetite - - - - - - -  Feeling bad or failure about yourself  - - - - - - -  Trouble concentrating - - - - - - -  Moving slowly or fidgety/restless - - - - - - -  Suicidal thoughts - - - - - - -  PHQ-9 Score - - - - - - -    Review of Systems  Musculoskeletal:        Right foot pain  All other systems reviewed and are negative.     Objective:   Physical Exam Vitals and nursing note reviewed.  Constitutional:      Appearance: Normal appearance.  Cardiovascular:     Rate and Rhythm: Normal rate and regular rhythm.     Pulses: Normal pulses.     Heart sounds: Normal heart sounds.  Pulmonary:     Effort: Pulmonary effort is normal.     Breath sounds: Normal breath sounds.  Musculoskeletal:     Cervical back: Normal range of motion and neck supple.     Comments: Normal Muscle Bulk and Muscle Testing Reveals:  Upper Extremities: Full ROM and Muscle  Strength 5/5 Lower Extremities: Full ROM and Muscle Strength 5/5 Arises from chair with ease Narrow Based  Gait     Skin:    General: Skin is warm and dry.  Neurological:     Mental Status: He is alert and oriented to person, place, and time.  Psychiatric:        Mood and Affect: Mood normal.        Behavior: Behavior normal.         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   We will continue the opioid monitoring program, this consists of regular clinic visits, examinations, urine drug screen, pill counts as well as use of [place] Controlled Substance Reporting system. A 12 month History has been reviewed on the [place] Controlled Substance Reporting System on 02/03/2021.  2. Diabetic peripheral neuropathy affecting both feet:  Continue current medication regimen with Gabapentin .02/03/2021    F/U in 1 month

## 2021-03-03 ENCOUNTER — Other Ambulatory Visit: Payer: Self-pay

## 2021-03-03 ENCOUNTER — Encounter: Payer: Medicare Other | Attending: Physical Medicine & Rehabilitation | Admitting: Registered Nurse

## 2021-03-03 ENCOUNTER — Encounter: Payer: Self-pay | Admitting: Registered Nurse

## 2021-03-03 VITALS — BP 156/55 | HR 68 | Temp 99.6°F | Ht 67.0 in | Wt 168.0 lb

## 2021-03-03 DIAGNOSIS — M25552 Pain in left hip: Secondary | ICD-10-CM | POA: Diagnosis present

## 2021-03-03 DIAGNOSIS — Z86018 Personal history of other benign neoplasm: Secondary | ICD-10-CM | POA: Insufficient documentation

## 2021-03-03 DIAGNOSIS — Z5181 Encounter for therapeutic drug level monitoring: Secondary | ICD-10-CM | POA: Insufficient documentation

## 2021-03-03 DIAGNOSIS — G894 Chronic pain syndrome: Secondary | ICD-10-CM | POA: Insufficient documentation

## 2021-03-03 DIAGNOSIS — Z79891 Long term (current) use of opiate analgesic: Secondary | ICD-10-CM | POA: Insufficient documentation

## 2021-03-03 MED ORDER — OXYCODONE HCL 15 MG PO TABS: 15.0000 mg | ORAL_TABLET | Freq: Three times a day (TID) | ORAL | 0 refills | Status: DC | PRN

## 2021-03-03 NOTE — Progress Notes (Addendum)
Subjective:    Patient ID: Troy [name], male    DOB: [birth year], 76 y.o.   MRN: [id number]  HPI: Troy [name] is a 76 y.o. male who returns for follow up appointment for chronic pain and medication refill. He states his pain is located in his right foot and left buttock ( ischial Tenderness). He rates his pain 9. His current exercise regime is walking and performing stretching exercises.  Troy [name] is 67.50 MME.   Last UDS was Performed on 01/05/2021, it was consistent.      Pain Inventory Average Pain 8 Pain Right Now 9 My pain is constant, aching, and numb  In the last 24 hours, has pain interfered with the following? General activity 8 Relation with others 8 Enjoyment of life 8 What TIME of day is your pain at its worst? daytime and evening Sleep (in general) Good  Pain is worse with: walking and some activites Pain improves with: rest and medication Relief from Meds: 8  Family History  Problem Relation Age of Onset   Hypertension Mother    Diabetes Mother    Stroke Mother        multiple   Hypertension Father    Stroke Father    Stroke Sister    Stroke Daughter    Social History   Socioeconomic History   Marital status: Married    Spouse name: Not on file   Number of children: Not on file   Years of education: Not on file   Highest education level: Not on file  Occupational History   Not on file  Tobacco Use   Smoking status: Never   Smokeless tobacco: Never  Vaping Use   Vaping Use: Never used  Substance and Sexual Activity   Alcohol use: No    Alcohol/week: 0.0 standard drinks   Drug use: No   Sexual activity: Not on file  Other Topics Concern   Not on file  Social History Narrative   Not on file   Social Determinants of Health   Financial Resource Strain: Not on file  Food Insecurity: Not on file  Transportation Needs: Not on file  Physical Activity: Not on file  Stress: Not on file  Social Connections: Not on file    Past Surgical History:  Procedure Laterality Date   CARDIAC SURGERY  2003   stent   [place]   S1 tumor removal by Dr. Annette Stable   VAGAL NERVE STIMULATOR REMOVAL     Past Surgical History:  Procedure Laterality Date   CARDIAC SURGERY  2003   stent   IMPLANTATION VAGAL NERVE STIMULATOR     TRIGGER FINGER RELEASE     TUMOR REMOVAL  2002   S1 tumor removal by Dr. Annette Stable   VAGAL NERVE STIMULATOR REMOVAL     Past Medical History:  Diagnosis Date   Arthritis    Diabetes mellitus    High blood pressure    Plantar fasciitis    BP (!) 156/55    Pulse 68    Temp 99.6 F (37.6 C)    Ht 5\' 7"  (1.702 m)    Wt 168 lb (76.2 kg)    SpO2 96%    BMI 26.31 kg/m   Opioid Risk Score:   Fall Risk Score:  `1  Depression screen [facility] 2/9  Depression screen [facility] 2/9 02/03/2021 01/05/2021 12/02/2020 09/30/2020  04/06/2020 01/23/2020 10/22/2019  Decreased Interest 0 0 0 0 0 0 0  Down, Depressed, Hopeless 0 0 0 0 0 0 0  PHQ - 2 Score 0 0 0 0 0 0 0  Altered sleeping - - - - - - -  Tired, decreased energy - - - - - - -  Change in appetite - - - - - - -  Feeling bad or failure about yourself  - - - - - - -  Trouble concentrating - - - - - - -  Moving slowly or fidgety/restless - - - - - - -  Suicidal thoughts - - - - - - -  PHQ-9 Score - - - - - - -    Review of Systems  Musculoskeletal:        Pain in both feet & right hip  All other systems reviewed and are negative.     Objective:   Physical Exam Vitals and nursing note reviewed.  Constitutional:      Appearance: Normal appearance.  Cardiovascular:     Rate and Rhythm: Normal rate and regular rhythm.     Pulses: Normal pulses.     Heart sounds: Normal heart sounds.  Pulmonary:     Effort: Pulmonary effort is normal.     Breath sounds: Normal breath sounds.  Musculoskeletal:     Cervical back: Normal range of motion and neck supple.     Comments: Normal Muscle  Bulk and Muscle Testing Reveals:  Upper Extremities: Full ROM and Muscle Strength 5/5 Left Ischial Tenderness Lower Extremities: Full ROM and Muscle Strength 5/5 Arises from chair slowly Narrow Based   Gait     Skin:    General: Skin is warm and dry.  Neurological:     Mental Status: He is alert and oriented to person, place, and time.  Psychiatric:        Mood and Affect: Mood normal.        Behavior: Behavior normal.          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   We will continue the opioid monitoring program, this consists of regular clinic visits, examinations, urine drug screen, pill counts as well as use of [place] Controlled Substance Reporting system. A 12 month History has been reviewed on the [place] Controlled Substance Reporting System on 03/03/2021.  2. Diabetic peripheral neuropathy affecting both feet:  Continue current medication regimen with Gabapentin .03/03/2021 3. Ischial Pain : Left: Refuses injection : Continue HEP as Tolerated. Continue to Monitor. 03/03/2021   F/U in 1 month

## 2021-03-31 ENCOUNTER — Encounter: Payer: Self-pay | Admitting: Registered Nurse

## 2021-03-31 ENCOUNTER — Encounter: Payer: Medicare Other | Attending: Physical Medicine & Rehabilitation | Admitting: Registered Nurse

## 2021-03-31 ENCOUNTER — Other Ambulatory Visit: Payer: Self-pay

## 2021-03-31 VITALS — BP 156/63 | HR 69 | Ht 67.0 in | Wt 165.0 lb

## 2021-03-31 DIAGNOSIS — Z79891 Long term (current) use of opiate analgesic: Secondary | ICD-10-CM | POA: Insufficient documentation

## 2021-03-31 DIAGNOSIS — G894 Chronic pain syndrome: Secondary | ICD-10-CM | POA: Insufficient documentation

## 2021-03-31 DIAGNOSIS — E1149 Type 2 diabetes mellitus with other diabetic neurological complication: Secondary | ICD-10-CM | POA: Diagnosis present

## 2021-03-31 DIAGNOSIS — Z86018 Personal history of other benign neoplasm: Secondary | ICD-10-CM | POA: Diagnosis not present

## 2021-03-31 DIAGNOSIS — E1140 Type 2 diabetes mellitus with diabetic neuropathy, unspecified: Secondary | ICD-10-CM | POA: Diagnosis present

## 2021-03-31 DIAGNOSIS — Z5181 Encounter for therapeutic drug level monitoring: Secondary | ICD-10-CM | POA: Insufficient documentation

## 2021-03-31 MED ORDER — OXYCODONE HCL 15 MG PO TABS: 15.0000 mg | ORAL_TABLET | Freq: Three times a day (TID) | ORAL | 0 refills | Status: DC | PRN

## 2021-03-31 NOTE — Progress Notes (Signed)
Subjective:    Patient ID: Troy [name], male    DOB: 08-Apr-1945, 76 y.o.   MRN: [id number]  HPI: Troy [name] is a 77 y.o. male who returns for follow up appointment for chronic pain and medication refill. He states his pain is located in his right foot. He rates his pain 8. His current exercise regime is walking and performing stretching exercises.  Troy [name] Morphine equivalent is   67.50 MME.   Last UDS was Performed on 01/05/2021, it was consistent.    Pain Inventory Average Pain 8 Pain Right Now 8 My pain is aching  In the last 24 hours, has pain interfered with the following? General activity 8 Relation with others 8 Enjoyment of life 9 What TIME of day is your pain at its worst? daytime and evening Sleep (in general) Good  Pain is worse with: walking, inactivity, and some activites Pain improves with: rest and medication Relief from Meds: 8  Family History  Problem Relation Age of Onset   Hypertension Mother    Diabetes Mother    Stroke Mother        multiple   Hypertension Father    Stroke Father    Stroke Sister    Stroke Daughter    Social History   Socioeconomic History   Marital status: Married    Spouse name: Not on file   Number of children: Not on file   Years of education: Not on file   Highest education level: Not on file  Occupational History   Not on file  Tobacco Use   Smoking status: Never   Smokeless tobacco: Never  Vaping Use   Vaping Use: Never used  Substance and Sexual Activity   Alcohol use: No    Alcohol/week: 0.0 standard drinks   Drug use: No   Sexual activity: Not on file  Other Topics Concern   Not on file  Social History Narrative   Not on file   Social Determinants of Health   Financial Resource Strain: Not on file  Food Insecurity: Not on file  Transportation Needs: Not on file  Physical Activity: Not on file  Stress: Not on file  Social Connections: Not on file   Past Surgical History:  Procedure Laterality  Date   CARDIAC SURGERY  2003   stent   [place] [facility]   S1 tumor removal by Dr. Annette Stable   VAGAL NERVE STIMULATOR REMOVAL     Past Surgical History:  Procedure Laterality Date   CARDIAC SURGERY  2003   stent   IMPLANTATION VAGAL NERVE STIMULATOR     TRIGGER FINGER RELEASE     TUMOR REMOVAL  2002   S1 tumor removal by Dr. Annette Stable   VAGAL NERVE STIMULATOR REMOVAL     Past Medical History:  Diagnosis Date   Arthritis    Diabetes mellitus    High blood pressure    Plantar fasciitis    BP (!) 168/65    Pulse 69    Ht 5\' 7"  (1.702 m)    Wt 165 lb (74.8 kg)    SpO2 96%    BMI 25.84 kg/m   Opioid Risk Score:   Fall Risk Score:  `1  Depression screen PHQ 2/9  Depression screen [facility] 2/9 03/31/2021 03/03/2021 02/03/2021 01/05/2021 12/02/2020 09/30/2020 04/06/2020  Decreased Interest 0 0 0 0 0 0 0  Down, Depressed,  Hopeless 0 0 0 0 0 0 0  PHQ - 2 Score 0 0 0 0 0 0 0  Altered sleeping - - - - - - -  Tired, decreased energy - - - - - - -  Change in appetite - - - - - - -  Feeling bad or failure about yourself  - - - - - - -  Trouble concentrating - - - - - - -  Moving slowly or fidgety/restless - - - - - - -  Suicidal thoughts - - - - - - -  PHQ-9 Score - - - - - - -      Review of Systems  Musculoskeletal:        Feet pain  All other systems reviewed and are negative.     Objective:   Physical Exam Vitals and nursing note reviewed.  Constitutional:      Appearance: Normal appearance.  Cardiovascular:     Rate and Rhythm: Normal rate and regular rhythm.     Pulses: Normal pulses.     Heart sounds: Normal heart sounds.  Pulmonary:     Effort: Pulmonary effort is normal.     Breath sounds: Normal breath sounds.  Musculoskeletal:     Cervical back: Normal range of motion and neck supple.     Comments: Normal Muscle Bulk and Muscle Testing Reveals:  Upper Extremities: Full ROM and Muscle Strength   5/5 Lower Extremities Full ROM and Muscle Strength 5/5 Right Lower Extremity Flexion Produces Pain into his right foot Arises from Table with ease Narrow Based Gait     Skin:    General: Skin is warm and dry.  Neurological:     Mental Status: He is alert and oriented to person, place, and time.  Psychiatric:        Mood and Affect: Mood normal.        Behavior: Behavior normal.         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   We will continue the opioid monitoring program, this consists of regular clinic visits, examinations, urine drug screen, pill counts as well as use of [place] Controlled Substance Reporting system. A 12 month History has been reviewed on the [place] Controlled Substance Reporting System on 03/31/2021.  2. Diabetic peripheral neuropathy affecting both feet:  Continue current medication regimen with Gabapentin .03/31/2021 3. Ischial Pain : Left: No complaints today. Refuses injection : Continue HEP as Tolerated. Continue to Monitor. 03/31/2021   F/U in 1 month

## 2021-04-05 ENCOUNTER — Other Ambulatory Visit: Payer: Self-pay | Admitting: Registered Nurse

## 2021-04-05 MED ORDER — GABAPENTIN 400 MG PO CAPS: ORAL_CAPSULE | ORAL | 1 refills | Status: DC

## 2021-04-05 NOTE — Addendum Note (Signed)
Addended by: Bayard Hugger on: 04/05/2021 03:13 PM   Modules accepted: Orders

## 2021-04-05 NOTE — Addendum Note (Signed)
Addended by: Casilda Carls on: 04/05/2021 01:44 PM   Modules accepted: Orders

## 2021-04-05 NOTE — Telephone Encounter (Signed)
PMP was Reviewed.  Gabapentin e-scribed today.

## 2021-04-27 ENCOUNTER — Encounter: Payer: Medicare Other | Admitting: Registered Nurse

## 2021-04-28 ENCOUNTER — Other Ambulatory Visit: Payer: Self-pay

## 2021-04-28 ENCOUNTER — Encounter: Payer: Self-pay | Admitting: Registered Nurse

## 2021-04-28 ENCOUNTER — Encounter: Payer: Medicare Other | Attending: Physical Medicine & Rehabilitation | Admitting: Registered Nurse

## 2021-04-28 VITALS — BP 134/79 | HR 73 | Temp 98.3°F | Ht 67.0 in | Wt 165.6 lb

## 2021-04-28 DIAGNOSIS — Z5181 Encounter for therapeutic drug level monitoring: Secondary | ICD-10-CM | POA: Insufficient documentation

## 2021-04-28 DIAGNOSIS — Z79891 Long term (current) use of opiate analgesic: Secondary | ICD-10-CM | POA: Insufficient documentation

## 2021-04-28 DIAGNOSIS — G894 Chronic pain syndrome: Secondary | ICD-10-CM | POA: Diagnosis not present

## 2021-04-28 MED ORDER — OXYCODONE HCL 15 MG PO TABS: 15.0000 mg | ORAL_TABLET | Freq: Three times a day (TID) | ORAL | 0 refills | Status: DC | PRN

## 2021-04-28 NOTE — Progress Notes (Signed)
? ?Subjective:  ? ? Patient ID: Troy [name], male    DOB: [birth year], 76 y.o.   MRN: [id number] ? ?HPI: Troy [name] is a 76 y.o. male who returns for follow up appointment for chronic pain and medication refill. He states his pain is located in his right foot. He rates his pain 8. His current exercise regime is walking and performing stretching exercises. ? ?Mr. Devonshire states his wife having memory changes, he was instructed to call her doctor and ask for referral for neurology, he verbalizes understanding.  ? ?Mr. Phariss Morphine equivalent is 67.50 MME.   UDS ordered today.  ?  ? ?Pain Inventory ?Average Pain 9 ?Pain Right Now 8 ?My pain is constant and aching ? ?In the last 24 hours, has pain interfered with the following? ?General activity 8 ?Relation with others 8 ?Enjoyment of life 8 ?What TIME of day is your pain at its worst? daytime and evening ?Sleep (in general) Good ? ?Pain is worse with: walking and some activites ?Pain improves with: rest and medication ?Relief from Meds: 8 ? ?Family History  ?Problem Relation Age of Onset  ? Hypertension Mother   ? Diabetes Mother   ? Stroke Mother   ?     multiple  ? Hypertension Father   ? Stroke Father   ? Stroke Sister   ? Stroke Daughter   ? ?Social History  ? ?Socioeconomic History  ? Marital status: Married  ?  Spouse name: Not on file  ? Number of children: Not on file  ? Years of education: Not on file  ? Highest education level: Not on file  ?Occupational History  ? Not on file  ?Tobacco Use  ? Smoking status: Never  ? Smokeless tobacco: Never  ?Vaping Use  ? Vaping Use: Never used  ?Substance and Sexual Activity  ? Alcohol use: No  ?  Alcohol/week: 0.0 standard drinks  ? Drug use: No  ? Sexual activity: Not on file  ?Other Topics Concern  ? Not on file  ?Social History Narrative  ? Not on file  ? ?Social Determinants of Health  ? ?Financial Resource Strain: Not on file  ?Food Insecurity: Not on file  ?Transportation Needs: Not on file  ?Physical Activity:  Not on file  ?Stress: Not on file  ?Social Connections: Not on file  ? ?Past Surgical History:  ?Procedure Laterality Date  ? CARDIAC SURGERY  2003  ? stent  ? IMPLANTATION VAGAL NERVE STIMULATOR    ? TRIGGER FINGER RELEASE    ? TUMOR REMOVAL  2002  ? S1 tumor removal by Dr. Annette Stable  ? VAGAL NERVE STIMULATOR REMOVAL    ? ?Past Surgical History:  ?Procedure Laterality Date  ? CARDIAC SURGERY  2003  ? stent  ? IMPLANTATION VAGAL NERVE STIMULATOR    ? TRIGGER FINGER RELEASE    ? TUMOR REMOVAL  2002  ? S1 tumor removal by Dr. Annette Stable  ? VAGAL NERVE STIMULATOR REMOVAL    ? ?Past Medical History:  ?Diagnosis Date  ? Arthritis   ? Diabetes mellitus   ? High blood pressure   ? Plantar fasciitis   ? ?BP (!) 175/60   Pulse 72   Temp 98.3 ?F (36.8 ?C)   Ht '5\' 7"'$  (1.702 m)   Wt 165 lb 9.6 oz (75.1 kg)   SpO2 95%   BMI 25.94 kg/m?  ? ?Opioid Risk Score:   ?Fall Risk Score:  `1 ? ?Depression screen PHQ 2/9 ? ?  Depression screen [facility] 2/9 04/28/2021 03/31/2021 03/03/2021 02/03/2021 01/05/2021 12/02/2020 09/30/2020  ?Decreased Interest 0 0 0 0 0 0 0  ?Down, Depressed, Hopeless 0 0 0 0 0 0 0  ?PHQ - 2 Score 0 0 0 0 0 0 0  ?Altered sleeping - - - - - - -  ?Tired, decreased energy - - - - - - -  ?Change in appetite - - - - - - -  ?Feeling bad or failure about yourself  - - - - - - -  ?Trouble concentrating - - - - - - -  ?Moving slowly or fidgety/restless - - - - - - -  ?Suicidal thoughts - - - - - - -  ?PHQ-9 Score - - - - - - -  ?  ? ?Review of Systems  ?Constitutional: Negative.   ?HENT: Negative.    ?Eyes: Negative.   ?Respiratory: Negative.    ?Cardiovascular: Negative.   ?Gastrointestinal: Negative.   ?Endocrine: Negative.   ?Genitourinary: Negative.   ?Musculoskeletal: Negative.   ?Skin: Negative.   ?Allergic/Immunologic: Negative.   ?Neurological: Negative.   ?Hematological: Negative.   ?Psychiatric/Behavioral: Negative.    ? ?   ?Objective:  ? Physical Exam ?Vitals and nursing note reviewed.  ?Constitutional:   ?   Appearance: Normal  appearance.  ?Cardiovascular:  ?   Rate and Rhythm: Normal rate and regular rhythm.  ?   Pulses: Normal pulses.  ?   Heart sounds: Normal heart sounds.  ?Pulmonary:  ?   Effort: Pulmonary effort is normal.  ?   Breath sounds: Normal breath sounds.  ?Musculoskeletal:  ?   Cervical back: Normal range of motion and neck supple.  ?   Comments: Normal Muscle Bulk and Muscle Testing Reveals:  ?Upper Extremities: Full ROM and Muscle Strength 5/5 ?  Lower Extremities: Full ROM and Muscle Strength 5/5 ?Right Lower Extremity Flexion Produces Pain into his right foot ? Arises from Table with ease ?Narrow Based Gait  ?   ?Skin: ?   General: Skin is warm and dry.  ?Neurological:  ?   Mental Status: He is alert and oriented to person, place, and time.  ?Psychiatric:     ?   Mood and Affect: Mood normal.     ?   Behavior: Behavior normal.  ? ? ? ? ?   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   ?We will continue the opioid monitoring program, this consists of regular clinic visits, examinations, urine drug screen, pill counts as well as use of [place] Controlled Substance Reporting system. A 12 month History has been reviewed on the [place] Controlled Substance Reporting System on 04/28/2021.  ?2. Diabetic peripheral neuropathy affecting both feet:  Continue current medication regimen with Gabapentin .04/28/2021 ?3. Ischial Pain : Left: No complaints today. Refuses injection : Continue HEP as Tolerated. Continue to Monitor. 04/28/2021 ?  ?F/U in 1 month ?  ?  ? ?

## 2021-05-04 LAB — TOXASSURE SELECT,+ANTIDEPR,UR

## 2021-05-06 ENCOUNTER — Telehealth: Payer: Self-pay | Admitting: *Deleted

## 2021-05-06 NOTE — Telephone Encounter (Signed)
Urine drug screen for this encounter is consistent for prescribed medication 

## 2021-05-26 ENCOUNTER — Encounter: Payer: Medicare Other | Attending: Physical Medicine & Rehabilitation | Admitting: Registered Nurse

## 2021-05-26 ENCOUNTER — Encounter: Payer: Self-pay | Admitting: Registered Nurse

## 2021-05-26 VITALS — BP 164/64 | HR 70 | Ht 67.0 in | Wt 163.2 lb

## 2021-05-26 DIAGNOSIS — G894 Chronic pain syndrome: Secondary | ICD-10-CM | POA: Diagnosis present

## 2021-05-26 DIAGNOSIS — E1140 Type 2 diabetes mellitus with diabetic neuropathy, unspecified: Secondary | ICD-10-CM | POA: Diagnosis present

## 2021-05-26 DIAGNOSIS — Z86018 Personal history of other benign neoplasm: Secondary | ICD-10-CM

## 2021-05-26 DIAGNOSIS — Z5181 Encounter for therapeutic drug level monitoring: Secondary | ICD-10-CM | POA: Diagnosis present

## 2021-05-26 DIAGNOSIS — E1149 Type 2 diabetes mellitus with other diabetic neurological complication: Secondary | ICD-10-CM | POA: Insufficient documentation

## 2021-05-26 DIAGNOSIS — Z79891 Long term (current) use of opiate analgesic: Secondary | ICD-10-CM | POA: Diagnosis present

## 2021-05-26 MED ORDER — OXYCODONE HCL 15 MG PO TABS: 15.0000 mg | ORAL_TABLET | Freq: Three times a day (TID) | ORAL | 0 refills | Status: DC | PRN

## 2021-05-26 NOTE — Progress Notes (Signed)
? ?Subjective:  ? ? Patient ID: Troy [name], male    DOB: 1945-08-11, 76 y.o.   MRN: [id number] ? ?HPI: Troy [name] is a 76 y.o. male who returns for follow up appointment for chronic pain and medication refill. He states his pain is located in his right foot. He rates his pain 8. His current exercise regime is walking and performing stretching exercises. ? ?Mr. Firman Morphine equivalent is 67.50 MME.   Last UDS was Performed on 04/28/2021, it was consistent.  ?  ? ?Pain Inventory ?Average Pain 9 ?Pain Right Now 8 ?My pain is aching ? ?In the last 24 hours, has pain interfered with the following? ?General activity 9 ?Relation with others 8 ?Enjoyment of life 8 ?What TIME of day is your pain at its worst? daytime and evening ?Sleep (in general) Good ? ?Pain is worse with: walking and inactivity ?Pain improves with: rest and medication ?Relief from Meds: 8 ? ?Family History  ?Problem Relation Age of Onset  ? Hypertension Mother   ? Diabetes Mother   ? Stroke Mother   ?     multiple  ? Hypertension Father   ? Stroke Father   ? Stroke Sister   ? Stroke Daughter   ? ?Social History  ? ?Socioeconomic History  ? Marital status: Married  ?  Spouse name: Not on file  ? Number of children: Not on file  ? Years of education: Not on file  ? Highest education level: Not on file  ?Occupational History  ? Not on file  ?Tobacco Use  ? Smoking status: Never  ? Smokeless tobacco: Never  ?Vaping Use  ? Vaping Use: Never used  ?Substance and Sexual Activity  ? Alcohol use: No  ?  Alcohol/week: 0.0 standard drinks  ? Drug use: No  ? Sexual activity: Not on file  ?Other Topics Concern  ? Not on file  ?Social History Narrative  ? Not on file  ? ?Social Determinants of Health  ? ?Financial Resource Strain: Not on file  ?Food Insecurity: Not on file  ?Transportation Needs: Not on file  ?Physical Activity: Not on file  ?Stress: Not on file  ?Social Connections: Not on file  ? ?Past Surgical History:  ?Procedure Laterality Date  ? CARDIAC  SURGERY  2003  ? stent  ? IMPLANTATION VAGAL NERVE STIMULATOR    ? TRIGGER FINGER RELEASE    ? TUMOR REMOVAL  2002  ? S1 tumor removal by Dr. Annette Stable  ? VAGAL NERVE STIMULATOR REMOVAL    ? ?Past Surgical History:  ?Procedure Laterality Date  ? CARDIAC SURGERY  2003  ? stent  ? IMPLANTATION VAGAL NERVE STIMULATOR    ? TRIGGER FINGER RELEASE    ? TUMOR REMOVAL  2002  ? S1 tumor removal by Dr. Annette Stable  ? VAGAL NERVE STIMULATOR REMOVAL    ? ?Past Medical History:  ?Diagnosis Date  ? Arthritis   ? Diabetes mellitus   ? High blood pressure   ? Plantar fasciitis   ? ?BP (!) 164/64   Pulse 70   Ht '5\' 7"'$  (1.702 m)   Wt 163 lb 3.2 oz (74 kg)   SpO2 95%   BMI 25.56 kg/m?  ? ?Opioid Risk Score:   ?Fall Risk Score:  `1 ? ?Depression screen PHQ 2/9 ? ? ?  05/26/2021  ? 10:32 AM 04/28/2021  ?  9:36 AM 03/31/2021  ?  9:20 AM 03/03/2021  ?  9:01 AM 02/03/2021  ? 10:02  AM 01/05/2021  ?  8:50 AM 12/02/2020  ?  9:05 AM  ?Depression screen PHQ 2/9  ?Decreased Interest 0 0 0 0 0 0 0  ?Down, Depressed, Hopeless 0 0 0 0 0 0 0  ?PHQ - 2 Score 0 0 0 0 0 0 0  ?  ? ? ?Review of Systems  ?Constitutional: Negative.   ?HENT: Negative.    ?Eyes: Negative.   ?Respiratory: Negative.    ?Cardiovascular: Negative.   ?Endocrine: Negative.   ?Genitourinary: Negative.   ?Musculoskeletal:   ?     Right foot pain  ?Skin: Negative.   ?Allergic/Immunologic: Negative.   ?Neurological: Negative.   ?Hematological: Negative.   ?Psychiatric/Behavioral: Negative.    ?All other systems reviewed and are negative. ? ?   ?Objective:  ? Physical Exam ?Vitals and nursing note reviewed.  ?Constitutional:   ?   Appearance: Normal appearance.  ?Cardiovascular:  ?   Rate and Rhythm: Normal rate and regular rhythm.  ?   Pulses: Normal pulses.  ?   Heart sounds: Normal heart sounds.  ?Pulmonary:  ?   Effort: Pulmonary effort is normal.  ?   Breath sounds: Normal breath sounds.  ?Musculoskeletal:  ?   Cervical back: Normal range of motion and neck supple.  ?   Comments: Normal Muscle  Bulk and Muscle Testing Reveals:  ?Upper Extremities: Full ROM and Muscle Strength 5/5 ?Lower Extremities: Full ROM and Muscle Strength 5/5 ?Right Lower Extremity Flexion Produces Pain into his right foot ?Arises from Table with ease ?Narrow Based  Gait  ?   ?Skin: ?   General: Skin is warm and dry.  ?Neurological:  ?   Mental Status: He is alert and oriented to person, place, and time.  ?Psychiatric:     ?   Mood and Affect: Mood normal.     ?   Behavior: Behavior normal.  ? ? ? ? ?   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   ?We will continue the opioid monitoring program, this consists of regular clinic visits, examinations, urine drug screen, pill counts as well as use of [place] Controlled Substance Reporting system. A 12 month History has been reviewed on the [place] Controlled Substance Reporting System on 05/26/2021.  ?2. Diabetic peripheral neuropathy affecting both feet:  Continue current medication regimen with Gabapentin .05/26/2021 ?3. Ischial Pain : Left: No complaints today. Refuses injection : Continue HEP as Tolerated. Continue to Monitor. 05/26/2021 ?  ?F/U in 1 month ?  ?

## 2021-06-23 ENCOUNTER — Encounter: Payer: Medicare Other | Attending: Physical Medicine & Rehabilitation | Admitting: Registered Nurse

## 2021-06-23 ENCOUNTER — Encounter: Payer: Self-pay | Admitting: Registered Nurse

## 2021-06-23 VITALS — BP 168/51 | HR 66 | Ht 67.0 in | Wt 165.2 lb

## 2021-06-23 DIAGNOSIS — Z79891 Long term (current) use of opiate analgesic: Secondary | ICD-10-CM | POA: Diagnosis present

## 2021-06-23 DIAGNOSIS — G894 Chronic pain syndrome: Secondary | ICD-10-CM | POA: Diagnosis present

## 2021-06-23 DIAGNOSIS — E1140 Type 2 diabetes mellitus with diabetic neuropathy, unspecified: Secondary | ICD-10-CM | POA: Diagnosis present

## 2021-06-23 DIAGNOSIS — Z86018 Personal history of other benign neoplasm: Secondary | ICD-10-CM | POA: Diagnosis present

## 2021-06-23 DIAGNOSIS — E1149 Type 2 diabetes mellitus with other diabetic neurological complication: Secondary | ICD-10-CM | POA: Diagnosis present

## 2021-06-23 DIAGNOSIS — Z5181 Encounter for therapeutic drug level monitoring: Secondary | ICD-10-CM | POA: Insufficient documentation

## 2021-06-23 MED ORDER — OXYCODONE HCL 15 MG PO TABS: 15.0000 mg | ORAL_TABLET | Freq: Three times a day (TID) | ORAL | 0 refills | Status: DC | PRN

## 2021-06-23 NOTE — Progress Notes (Signed)
? ?Subjective:  ? ? Patient ID: Troy [name], male    DOB: 17-Jun-1945, 76 y.o.   MRN: [id number] ? ?HPI: Troy [name] is a 76 y.o. male who returns for follow up appointment for chronic pain and medication refill. He states his pain is located in his right foot with numbness. He rates his pain 9. His current exercise regime is walking and performing light yard work. ? ?Troy [name] Morphine equivalent is 67.50 MME.   Last UDS was Performed on 04/28/2021, it was consistent.   ?  ? ?Pain Inventory ?Average Pain 9 ?Pain Right Now 9 ?My pain is constant and aching ? ?In the last 24 hours, has pain interfered with the following? ?General activity 9 ?Relation with others 8 ?Enjoyment of life 8 ?What TIME of day is your pain at its worst? daytime and evening ?Sleep (in general) Good ? ?Pain is worse with: walking and some activites ?Pain improves with: rest and medication ?Relief from Meds: 8 ? ?Family History  ?Problem Relation Age of Onset  ? Hypertension Mother   ? Diabetes Mother   ? Stroke Mother   ?     multiple  ? Hypertension Father   ? Stroke Father   ? Stroke Sister   ? Stroke Daughter   ? ?Social History  ? ?Socioeconomic History  ? Marital status: Married  ?  Spouse name: Not on file  ? Number of children: Not on file  ? Years of education: Not on file  ? Highest education level: Not on file  ?Occupational History  ? Not on file  ?Tobacco Use  ? Smoking status: Never  ? Smokeless tobacco: Never  ?Vaping Use  ? Vaping Use: Never used  ?Substance and Sexual Activity  ? Alcohol use: No  ?  Alcohol/week: 0.0 standard drinks  ? Drug use: No  ? Sexual activity: Not on file  ?Other Topics Concern  ? Not on file  ?Social History Narrative  ? Not on file  ? ?Social Determinants of Health  ? ?Financial Resource Strain: Not on file  ?Food Insecurity: Not on file  ?Transportation Needs: Not on file  ?Physical Activity: Not on file  ?Stress: Not on file  ?Social Connections: Not on file  ? ?Past Surgical History:  ?Procedure  Laterality Date  ? CARDIAC SURGERY  2003  ? stent  ? IMPLANTATION VAGAL NERVE STIMULATOR    ? TRIGGER FINGER RELEASE    ? TUMOR REMOVAL  2002  ? S1 tumor removal by Dr. Annette Stable  ? VAGAL NERVE STIMULATOR REMOVAL    ? ?Past Surgical History:  ?Procedure Laterality Date  ? CARDIAC SURGERY  2003  ? stent  ? IMPLANTATION VAGAL NERVE STIMULATOR    ? TRIGGER FINGER RELEASE    ? TUMOR REMOVAL  2002  ? S1 tumor removal by Dr. Annette Stable  ? VAGAL NERVE STIMULATOR REMOVAL    ? ?Past Medical History:  ?Diagnosis Date  ? Arthritis   ? Diabetes mellitus   ? High blood pressure   ? Plantar fasciitis   ? ?BP (!) 180/55   Pulse 66   Ht '5\' 7"'$  (1.702 m)   Wt 165 lb 3.2 oz (74.9 kg)   SpO2 96%   BMI 25.87 kg/m?  ? ?Opioid Risk Score:   ?Fall Risk Score:  `1 ? ?Depression screen PHQ 2/9 ? ? ?  06/23/2021  ? 11:00 AM 05/26/2021  ? 10:32 AM 04/28/2021  ?  9:36 AM 03/31/2021  ?  9:20 AM 03/03/2021  ?  9:01 AM 02/03/2021  ? 10:02 AM 01/05/2021  ?  8:50 AM  ?Depression screen PHQ 2/9  ?Decreased Interest 0 0 0 0 0 0 0  ?Down, Depressed, Hopeless 0 0 0 0 0 0 0  ?PHQ - 2 Score 0 0 0 0 0 0 0  ?  ? ?Review of Systems  ?Constitutional: Negative.   ?HENT: Negative.    ?Eyes: Negative.   ?Respiratory: Negative.    ?Cardiovascular: Negative.   ?Gastrointestinal: Negative.   ?Endocrine: Negative.   ?Genitourinary: Negative.   ?Musculoskeletal: Negative.   ?Skin: Negative.   ?Allergic/Immunologic: Negative.   ?Neurological: Negative.   ?Hematological: Negative.   ?Psychiatric/Behavioral: Negative.    ? ?   ?Objective:  ? Physical Exam ?Vitals and nursing note reviewed.  ?Constitutional:   ?   Appearance: Normal appearance.  ?Cardiovascular:  ?   Rate and Rhythm: Normal rate and regular rhythm.  ?   Pulses: Normal pulses.  ?   Heart sounds: Normal heart sounds.  ?Pulmonary:  ?   Effort: Pulmonary effort is normal.  ?   Breath sounds: Normal breath sounds.  ?Musculoskeletal:  ?   Cervical back: Normal range of motion and neck supple.  ?   Comments: Normal Muscle  Bulk and Muscle Testing Reveals:  ?Upper Extremities: Full ROM and Muscle Strength 5/5 ?Lower Extremities: Full ROM and Muscle Strength 5/5 ?Arises from chair slowly ?Narrow Based  Gait  ?   ?Skin: ?   General: Skin is warm and dry.  ?Neurological:  ?   Mental Status: He is alert and oriented to person, place, and time.  ?Psychiatric:     ?   Mood and Affect: Mood normal.     ?   Behavior: Behavior normal.  ? ? ? ? ?   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   ?We will continue the opioid monitoring program, this consists of regular clinic visits, examinations, urine drug screen, pill counts as well as use of [place] Controlled Substance Reporting system. A 12 month History has been reviewed on the [place] Controlled Substance Reporting System on 06/23/2021.  ?2. Diabetic peripheral neuropathy affecting both feet:  Continue current medication regimen with Gabapentin .06/23/2021 ?3. Ischial Pain : Left: No complaints today. Refuses injection : Continue HEP as Tolerated. Continue to Monitor. 06/23/2021 ?  ?F/U in 1 month ?  ? ?

## 2021-07-12 ENCOUNTER — Other Ambulatory Visit: Payer: Self-pay | Admitting: Registered Nurse

## 2021-07-13 NOTE — Telephone Encounter (Signed)
PMP was Reviewed.  Gabapentin e-scribed today.

## 2021-07-25 ENCOUNTER — Encounter: Payer: Self-pay | Admitting: Registered Nurse

## 2021-07-25 ENCOUNTER — Encounter: Payer: Medicare Other | Attending: Physical Medicine & Rehabilitation | Admitting: Registered Nurse

## 2021-07-25 VITALS — BP 163/69 | HR 68 | Ht 67.0 in | Wt 165.2 lb

## 2021-07-25 DIAGNOSIS — Z79891 Long term (current) use of opiate analgesic: Secondary | ICD-10-CM | POA: Insufficient documentation

## 2021-07-25 DIAGNOSIS — Z86018 Personal history of other benign neoplasm: Secondary | ICD-10-CM | POA: Insufficient documentation

## 2021-07-25 DIAGNOSIS — E1149 Type 2 diabetes mellitus with other diabetic neurological complication: Secondary | ICD-10-CM | POA: Insufficient documentation

## 2021-07-25 DIAGNOSIS — G894 Chronic pain syndrome: Secondary | ICD-10-CM | POA: Diagnosis present

## 2021-07-25 DIAGNOSIS — Z5181 Encounter for therapeutic drug level monitoring: Secondary | ICD-10-CM | POA: Diagnosis present

## 2021-07-25 DIAGNOSIS — E1140 Type 2 diabetes mellitus with diabetic neuropathy, unspecified: Secondary | ICD-10-CM | POA: Diagnosis present

## 2021-07-25 MED ORDER — OXYCODONE HCL 15 MG PO TABS: 15.0000 mg | ORAL_TABLET | Freq: Three times a day (TID) | ORAL | 0 refills | Status: DC | PRN

## 2021-07-25 NOTE — Progress Notes (Signed)
Subjective:    Patient ID: Troy [name], male    DOB: [birth year], 76 y.o.   MRN: [id number]  HPI: Troy [name] is a 76 y.o. male who returns for follow up appointment for chronic pain and medication refill. He states his  pain is located in his right foot. He rates his pain 9. His current exercise regime is walking and performing stretching exercises.  Troy [name] Morphine equivalent is 67.50 MME.   Lst UDS was Performed on 04/28/2021, it was consistent.     Pain Inventory Average Pain 9 Pain Right Now 9 My pain is constant and aching  In the last 24 hours, has pain interfered with the following? General activity 9 Relation with others 9 Enjoyment of life 9 What TIME of day is your pain at its worst? daytime and evening Sleep (in general) Good  Pain is worse with: walking and some activites Pain improves with: rest and medication Relief from Meds: 8  Family History  Problem Relation Age of Onset  . Hypertension Mother   . Diabetes Mother   . Stroke Mother        multiple  . Hypertension Father   . Stroke Father   . Stroke Sister   . Stroke Daughter    Social History   Socioeconomic History  . Marital status: Married    Spouse name: Not on file  . Number of children: Not on file  . Years of education: Not on file  . Highest education level: Not on file  Occupational History  . Not on file  Tobacco Use  . Smoking status: Never  . Smokeless tobacco: Never  Vaping Use  . Vaping Use: Never used  Substance and Sexual Activity  . Alcohol use: No    Alcohol/week: 0.0 standard drinks  . Drug use: No  . Sexual activity: Not on file  Other Topics Concern  . Not on file  Social History Narrative  . Not on file   Social Determinants of Health   Financial Resource Strain: Not on file  Food Insecurity: Not on file  Transportation Needs: Not on file  Physical Activity: Not on file  Stress: Not on file  Social Connections: Not on file   Past Surgical History:   Procedure Laterality Date  . CARDIAC SURGERY  2003   stent  . IMPLANTATION VAGAL NERVE STIMULATOR    . TRIGGER FINGER RELEASE    . TUMOR REMOVAL  2002   S1 tumor removal by Troy [name]  . VAGAL NERVE STIMULATOR REMOVAL     Past Surgical History:  Procedure Laterality Date  . CARDIAC SURGERY  2003   stent  . IMPLANTATION VAGAL NERVE STIMULATOR    . TRIGGER FINGER RELEASE    . TUMOR REMOVAL  2002   S1 tumor removal by Troy [name]  . VAGAL NERVE STIMULATOR REMOVAL     Past Medical History:  Diagnosis Date  . Arthritis   . Diabetes mellitus   . High blood pressure   . Plantar fasciitis    BP (!) 163/69   Pulse 68   Ht 5\' 7"  (1.702 m)   Wt 165 lb 3.2 oz (74.9 kg)   SpO2 95%   BMI 25.87 kg/m   Opioid Risk Score:   Fall Risk Score:  `1  Depression screen [facility] 2/9     07/25/2021   10:49 AM 06/23/2021   11:00 AM 05/26/2021   10:32 AM 04/28/2021    9:36 AM 03/31/2021  9:20 AM 03/03/2021    9:01 AM 02/03/2021   10:02 AM  Depression screen PHQ 2/9  Decreased Interest 0 0 0 0 0 0 0  Down, Depressed, Hopeless 0 0 0 0 0 0 0  PHQ - 2 Score 0 0 0 0 0 0 0     Review of Systems  Musculoskeletal:        Right foot pain  All other systems reviewed and are negative.     Objective:   Physical Exam Vitals and nursing note reviewed.  Constitutional:      Appearance: Normal appearance.  Cardiovascular:     Rate and Rhythm: Normal rate and regular rhythm.     Pulses: Normal pulses.     Heart sounds: Normal heart sounds.  Pulmonary:     Effort: Pulmonary effort is normal.     Breath sounds: Normal breath sounds.  Musculoskeletal:     Cervical back: Normal range of motion and neck supple.     Comments: Normal Muscle Bulk and Muscle Testing Reveals:  Upper Extremities: Full ROM and Muscle Strength 5/5  Lower Extremities: Full ROM and Muscle Strength 5/5 Right Lower Extremity Flexion Produces Pain into his right foot Arises from chir with ease Narrow Based  Gait     Skin:     General: Skin is warm and dry.  Neurological:     Mental Status: He is alert and oriented to person, place, and time.  Psychiatric:        Mood and Affect: Mood normal.        Behavior: Behavior normal.         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   We will continue the opioid monitoring program, this consists of regular clinic visits, examinations, urine drug screen, pill counts as well as use of [place] Controlled Substance Reporting system. A 12 month History has been reviewed on the [facility] on 07/25/2021.  2. Diabetic peripheral neuropathy affecting both feet:  Continue current medication regimen with Gabapentin .07/25/2021 3. Ischial Pain : Left: No complaints today. Refuses injection : Continue HEP as Tolerated. Continue to Monitor. 07/25/2021   F/U in 1 month

## 2021-08-25 ENCOUNTER — Encounter: Payer: Medicare Other | Admitting: Registered Nurse

## 2021-08-29 ENCOUNTER — Encounter: Payer: Medicare Other | Attending: Physical Medicine & Rehabilitation | Admitting: Registered Nurse

## 2021-08-29 VITALS — BP 156/76 | HR 62 | Ht 67.0 in | Wt 160.8 lb

## 2021-08-29 DIAGNOSIS — E1149 Type 2 diabetes mellitus with other diabetic neurological complication: Secondary | ICD-10-CM | POA: Insufficient documentation

## 2021-08-29 DIAGNOSIS — Z86018 Personal history of other benign neoplasm: Secondary | ICD-10-CM | POA: Insufficient documentation

## 2021-08-29 DIAGNOSIS — E1140 Type 2 diabetes mellitus with diabetic neuropathy, unspecified: Secondary | ICD-10-CM | POA: Insufficient documentation

## 2021-08-29 DIAGNOSIS — G894 Chronic pain syndrome: Secondary | ICD-10-CM | POA: Insufficient documentation

## 2021-08-29 DIAGNOSIS — Z5181 Encounter for therapeutic drug level monitoring: Secondary | ICD-10-CM | POA: Diagnosis not present

## 2021-08-29 DIAGNOSIS — Z79891 Long term (current) use of opiate analgesic: Secondary | ICD-10-CM | POA: Insufficient documentation

## 2021-08-29 MED ORDER — OXYCODONE HCL 15 MG PO TABS: 15.0000 mg | ORAL_TABLET | Freq: Three times a day (TID) | ORAL | 0 refills | Status: DC | PRN

## 2021-08-29 NOTE — Progress Notes (Signed)
Subjective:    Patient ID: Troy [name], male    DOB: 11-17-1945, 76 y.o.   MRN: [id number]  HPI: Troy [name] is a 76 y.o. male who returns for follow up appointment for chronic pain and medication refill. He states his pain is located in his right foot with tingling and burning. He rates his pain 9. His current exercise regime is walking and light yardwork.  Mr/ Ricard Dillon Morphine equivalent is 67.50 MME.   UDS was ordered today.     Pain Inventory Average Pain 9 Pain Right Now 9 My pain is aching  In the last 24 hours, has pain interfered with the following? General activity 8 Relation with others 8 Enjoyment of life 9 What TIME of day is your pain at its worst? daytime and evening Sleep (in general) Good  Pain is worse with: walking and some activites Pain improves with: rest and medication Relief from Meds: 8  Family History  Problem Relation Age of Onset   Hypertension Mother    Diabetes Mother    Stroke Mother        multiple   Hypertension Father    Stroke Father    Stroke Sister    Stroke Daughter    Social History   Socioeconomic History   Marital status: Married    Spouse name: Not on file   Number of children: Not on file   Years of education: Not on file   Highest education level: Not on file  Occupational History   Not on file  Tobacco Use   Smoking status: Never   Smokeless tobacco: Never  Vaping Use   Vaping Use: Never used  Substance and Sexual Activity   Alcohol use: No    Alcohol/week: 0.0 standard drinks of alcohol   Drug use: No   Sexual activity: Not on file  Other Topics Concern   Not on file  Social History Narrative   Not on file   Social Determinants of Health   Financial Resource Strain: Not on file  Food Insecurity: Not on file  Transportation Needs: Not on file  Physical Activity: Not on file  Stress: Not on file  Social Connections: Not on file   Past Surgical History:  Procedure Laterality Date   CARDIAC SURGERY   2003   stent   [place]   S1 tumor removal by Dr. Annette Stable   VAGAL NERVE STIMULATOR REMOVAL     Past Surgical History:  Procedure Laterality Date   CARDIAC SURGERY  2003   stent   IMPLANTATION VAGAL NERVE STIMULATOR     TRIGGER FINGER RELEASE     TUMOR REMOVAL  2002   S1 tumor removal by Dr. Annette Stable   VAGAL NERVE STIMULATOR REMOVAL     Past Medical History:  Diagnosis Date   Arthritis    Diabetes mellitus    High blood pressure    Plantar fasciitis    BP (!) 174/62   Pulse 64   Ht '5\' 7"'$  (1.702 m)   Wt 160 lb 12.8 oz (72.9 kg)   SpO2 97%   BMI 25.18 kg/m   Opioid Risk Score:   Fall Risk Score:  `1  Depression screen [facility] 2/9     07/25/2021   10:49 AM 06/23/2021   11:00 AM 05/26/2021   10:32 AM 04/28/2021    9:36 AM 03/31/2021    9:20  AM 03/03/2021    9:01 AM 02/03/2021   10:02 AM  Depression screen [facility] 2/9  Decreased Interest 0 0 0 0 0 0 0  Down, Depressed, Hopeless 0 0 0 0 0 0 0  PHQ - 2 Score 0 0 0 0 0 0 0     Review of Systems  Musculoskeletal:        Right foot pain  All other systems reviewed and are negative.     Objective:   Physical Exam Vitals and nursing note reviewed.  Constitutional:      Appearance: Normal appearance.  Cardiovascular:     Rate and Rhythm: Normal rate and regular rhythm.     Pulses: Normal pulses.     Heart sounds: Normal heart sounds.  Pulmonary:     Effort: Pulmonary effort is normal.     Breath sounds: Normal breath sounds.  Musculoskeletal:     Cervical back: Normal range of motion and neck supple.     Comments: Normal Muscle Bulk and Muscle Testing Reveals:  Upper Extremities: Full ROM and Muscle Strength 5/5 Lower Extremities : Full ROM and Muscle Strength 5/5 Righ Lower Extremity Flexion Produces Pain into his right foot Arises from chair slowly Narrow Based Gait     Skin:    General: Skin is warm and dry.  Neurological:     Mental Status: He  is alert and oriented to person, place, and time.  Psychiatric:        Mood and Affect: Mood normal.        Behavior: Behavior normal.         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   We will continue the opioid monitoring program, this consists of regular clinic visits, examinations, urine drug screen, pill counts as well as use of [place] Controlled Substance Reporting system. A 12 month History has been reviewed on the [place] Controlled Substance Reporting System on 08/29/2021.  2. Diabetic peripheral neuropathy affecting both feet:  Continue current medication regimen with Gabapentin .08/29/2021 3. Ischial Pain : Left:Noo complaints today. Refuses injection : Continue HEP as Tolerated. Continue to Monitor. 08/29/2021   F/U in 1 month

## 2021-09-02 LAB — TOXASSURE SELECT,+ANTIDEPR,UR

## 2021-09-05 ENCOUNTER — Encounter: Payer: Self-pay | Admitting: Registered Nurse

## 2021-09-09 ENCOUNTER — Telehealth: Payer: Self-pay | Admitting: *Deleted

## 2021-09-09 NOTE — Telephone Encounter (Signed)
Urine drug screen for this encounter is consistent for prescribed medication 

## 2021-09-26 ENCOUNTER — Encounter: Payer: Self-pay | Admitting: Registered Nurse

## 2021-09-26 ENCOUNTER — Encounter: Payer: Medicare Other | Attending: Physical Medicine & Rehabilitation | Admitting: Registered Nurse

## 2021-09-26 VITALS — BP 146/62 | HR 64 | Ht 67.0 in | Wt 161.0 lb

## 2021-09-26 DIAGNOSIS — G894 Chronic pain syndrome: Secondary | ICD-10-CM | POA: Diagnosis present

## 2021-09-26 DIAGNOSIS — Z79891 Long term (current) use of opiate analgesic: Secondary | ICD-10-CM | POA: Diagnosis present

## 2021-09-26 DIAGNOSIS — Z86018 Personal history of other benign neoplasm: Secondary | ICD-10-CM | POA: Diagnosis present

## 2021-09-26 DIAGNOSIS — Z5181 Encounter for therapeutic drug level monitoring: Secondary | ICD-10-CM | POA: Diagnosis present

## 2021-09-26 DIAGNOSIS — E1149 Type 2 diabetes mellitus with other diabetic neurological complication: Secondary | ICD-10-CM | POA: Insufficient documentation

## 2021-09-26 DIAGNOSIS — E1140 Type 2 diabetes mellitus with diabetic neuropathy, unspecified: Secondary | ICD-10-CM | POA: Insufficient documentation

## 2021-09-26 MED ORDER — OXYCODONE HCL 15 MG PO TABS: 15.0000 mg | ORAL_TABLET | Freq: Three times a day (TID) | ORAL | 0 refills | Status: DC | PRN

## 2021-09-26 NOTE — Progress Notes (Signed)
Subjective:    Patient ID: Troy [name], male    DOB: 11-24-1945, 76 y.o.   MRN: [id number]  HPI: Troy [name] is a 76 y.o. male who returns for follow up appointment for chronic pain and medication refill. He states his pain is located in his right foot with numbness and tingling.Marland Kitchen He rates  his pain 9. His current exercise regime is walking and performing stretching exercises.  [name] equivalent is 67.50 MME.   Last UDS was Performed on 08/29/2021, it was consistent.    Pain Inventory Average Pain 9 Pain Right Now 9 My pain is constant, sharp, burning, and aching  In the last 24 hours, has pain interfered with the following? General activity 8 Relation with others 8 Enjoyment of life 8 What TIME of day is your pain at its worst? daytime and evening Sleep (in general) Good  Pain is worse with: walking, inactivity, and some activites Pain improves with: rest and medication Relief from Meds: 8  Family History  Problem Relation Age of Onset   Hypertension Mother    Diabetes Mother    Stroke Mother        multiple   Hypertension Father    Stroke Father    Stroke Sister    Stroke Daughter    Social History   Socioeconomic History   Marital status: Married    Spouse name: Not on file   Number of children: Not on file   Years of education: Not on file   Highest education level: Not on file  Occupational History   Not on file  Tobacco Use   Smoking status: Never   Smokeless tobacco: Never  Vaping Use   Vaping Use: Never used  Substance and Sexual Activity   Alcohol use: No    Alcohol/week: 0.0 standard drinks of alcohol   Drug use: No   Sexual activity: Not on file  Other Topics Concern   Not on file  Social History Narrative   Not on file   Social Determinants of Health   Financial Resource Strain: Not on file  Food Insecurity: Not on file  Transportation Needs: Not on file  Physical Activity: Not on file  Stress: Not on file  Social  Connections: Not on file   Past Surgical History:  Procedure Laterality Date   CARDIAC SURGERY  2003   stent   Troy [name]   S1 tumor removal by Troy [name]   VAGAL NERVE STIMULATOR REMOVAL     Past Surgical History:  Procedure Laterality Date   CARDIAC SURGERY  2003   stent   IMPLANTATION VAGAL NERVE STIMULATOR     TRIGGER FINGER RELEASE     TUMOR REMOVAL  2002   S1 tumor removal by Troy [name]   VAGAL NERVE STIMULATOR REMOVAL     Past Medical History:  Diagnosis Date   Arthritis    Diabetes mellitus    High blood pressure    Plantar fasciitis    BP (!) 146/62   Pulse 64   Ht '5\' 7"'$  (1.702 m)   Wt 161 lb (73 kg)   SpO2 95%   BMI 25.22 kg/m   Opioid Risk Score:   Fall Risk Score:  `1  Depression screen [facility] 2/9     09/26/2021   10:43 AM 08/29/2021   10:55 AM 07/25/2021   10:49 AM 06/23/2021  11:00 AM 05/26/2021   10:32 AM 04/28/2021    9:36 AM 03/31/2021    9:20 AM  Depression screen [facility] 2/9  Decreased Interest 0 0 0 0 0 0 0  Down, Depressed, Hopeless 0 0 0 0 0 0 0  PHQ - 2 Score 0 0 0 0 0 0 0      Review of Systems  Musculoskeletal:  Positive for gait problem.       Right foot pain   All other systems reviewed and are negative.      Objective:   Physical Exam Vitals and nursing note reviewed.  Constitutional:      Appearance: Normal appearance.  Cardiovascular:     Rate and Rhythm: Normal rate and regular rhythm.     Pulses: Normal pulses.     Heart sounds: Normal heart sounds.  Pulmonary:     Effort: Pulmonary effort is normal.     Breath sounds: Normal breath sounds.  Musculoskeletal:     Cervical back: Normal range of motion and neck supple.     Comments: Normal Muscle Bulk and Muscle Testing Reveals: Upper Extremities: Full ROM and Muscle Strength 5/5  Lower Extremities: Full ROM and Muscle Strength 5/5 Arises from Table with ease Narrow Based  Gait     Skin:    General:  Skin is warm and dry.  Neurological:     Mental Status: He is alert and oriented to person, place, and time.  Psychiatric:        Mood and Affect: Mood normal.        Behavior: Behavior normal.         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   We will continue the opioid monitoring program, this consists of regular clinic visits, examinations, urine drug screen, pill counts as well as use of [place] Controlled Substance Reporting system. A 12 month History has been reviewed on the [place] Controlled Substance Reporting System on 09/26/2021.  2. Diabetic peripheral neuropathy affecting both feet:  Continue current medication regimen with Gabapentin .09/26/2021 3. Ischial Pain : Left:No complaints today. : Continue HEP as Tolerated. Continue to Monitor. 09/26/2021   F/U in 1 month

## 2021-10-27 ENCOUNTER — Encounter: Payer: Medicare Other | Attending: Physical Medicine & Rehabilitation | Admitting: Registered Nurse

## 2021-10-27 ENCOUNTER — Encounter: Payer: Self-pay | Admitting: Registered Nurse

## 2021-10-27 VITALS — BP 148/62 | HR 63 | Ht 67.0 in | Wt 159.0 lb

## 2021-10-27 DIAGNOSIS — Z5181 Encounter for therapeutic drug level monitoring: Secondary | ICD-10-CM | POA: Insufficient documentation

## 2021-10-27 DIAGNOSIS — G894 Chronic pain syndrome: Secondary | ICD-10-CM | POA: Insufficient documentation

## 2021-10-27 DIAGNOSIS — Z79891 Long term (current) use of opiate analgesic: Secondary | ICD-10-CM | POA: Insufficient documentation

## 2021-10-27 DIAGNOSIS — E1140 Type 2 diabetes mellitus with diabetic neuropathy, unspecified: Secondary | ICD-10-CM | POA: Diagnosis present

## 2021-10-27 DIAGNOSIS — E1149 Type 2 diabetes mellitus with other diabetic neurological complication: Secondary | ICD-10-CM | POA: Diagnosis present

## 2021-10-27 DIAGNOSIS — Z86018 Personal history of other benign neoplasm: Secondary | ICD-10-CM | POA: Diagnosis present

## 2021-10-27 MED ORDER — OXYCODONE HCL 15 MG PO TABS: 15.0000 mg | ORAL_TABLET | Freq: Three times a day (TID) | ORAL | 0 refills | Status: DC | PRN

## 2021-10-27 NOTE — Progress Notes (Signed)
Subjective:    Patient ID: Troy [name], male    DOB: [birth year], 76 y.o.   MRN: [id number]  HPI: Troy [name] is a 76 y.o. male who returns for follow up appointment for chronic pain and medication refill. He states his pain is located in his right foot. He rates his pain 10. His current exercise regime is walking, performing stretching exercises and light yard work.  Mr. Paulsen Morphine equivalent is 63.00 MME.   Last UDS was Performed on 08/29/2021, it was consistent.     Pain Inventory Average Pain 10 Pain Right Now 10 My pain is constant, burning, and aching  In the last 24 hours, has pain interfered with the following? General activity 9 Relation with others 9 Enjoyment of life 9 What TIME of day is your pain at its worst? daytime and evening Sleep (in general) Good  Pain is worse with: walking and some activites Pain improves with: rest and medication Relief from Meds: 8  Family History  Problem Relation Age of Onset   Hypertension Mother    Diabetes Mother    Stroke Mother        multiple   Hypertension Father    Stroke Father    Stroke Sister    Stroke Daughter    Social History   Socioeconomic History   Marital status: Married    Spouse name: Not on file   Number of children: Not on file   Years of education: Not on file   Highest education level: Not on file  Occupational History   Not on file  Tobacco Use   Smoking status: Never   Smokeless tobacco: Never  Vaping Use   Vaping Use: Never used  Substance and Sexual Activity   Alcohol use: No    Alcohol/week: 0.0 standard drinks of alcohol   Drug use: No   Sexual activity: Not on file  Other Topics Concern   Not on file  Social History Narrative   Not on file   Social Determinants of Health   Financial Resource Strain: Not on file  Food Insecurity: Not on file  Transportation Needs: Not on file  Physical Activity: Not on file  Stress: Not on file  Social Connections: Not on file   Past  Surgical History:  Procedure Laterality Date   CARDIAC SURGERY  2003   stent   [place]   S1 tumor removal by Dr. Annette Stable   VAGAL NERVE STIMULATOR REMOVAL     Past Surgical History:  Procedure Laterality Date   CARDIAC SURGERY  2003   stent   IMPLANTATION VAGAL NERVE STIMULATOR     TRIGGER FINGER RELEASE     TUMOR REMOVAL  2002   S1 tumor removal by Dr. Annette Stable   VAGAL NERVE STIMULATOR REMOVAL     Past Medical History:  Diagnosis Date   Arthritis    Diabetes mellitus    High blood pressure    Plantar fasciitis    There were no vitals taken for this visit.  Opioid Risk Score:   Fall Risk Score:  `1  Depression screen [facility] 2/9     09/26/2021   10:43 AM 08/29/2021   10:55 AM 07/25/2021   10:49 AM 06/23/2021   11:00 AM 05/26/2021   10:32 AM 04/28/2021    9:36 AM 03/31/2021    9:20 AM  Depression screen [facility] 2/9  Decreased Interest 0 0 0 0 0 0 0  Down, Depressed, Hopeless 0 0 0 0 0 0 0  PHQ - 2 Score 0 0 0 0 0 0 0    Review of Systems  Musculoskeletal:        Right foot pain  All other systems reviewed and are negative.      Objective:   Physical Exam Vitals and nursing note reviewed.  Constitutional:      Appearance: Normal appearance.  Cardiovascular:     Rate and Rhythm: Normal rate and regular rhythm.     Pulses: Normal pulses.     Heart sounds: Normal heart sounds.  Pulmonary:     Effort: Pulmonary effort is normal.     Breath sounds: Normal breath sounds.  Musculoskeletal:     Cervical back: Normal range of motion and neck supple.     Comments: Normal Muscle Bulk and Muscle Testing Reveals:  Upper Extremities: Full ROM and Muscle Strength 5/5 Lower Extremities: Full ROM and Muscle Strength 5/5 Right Lower Extremity Flexion Produces Pin into his right foot Arises from chair slowly Narrow Based  Gait     Skin:    General: Skin is warm and dry.  Neurological:     Mental Status: He is  alert and oriented to person, place, and time.  Psychiatric:        Mood and Affect: Mood normal.        Behavior: Behavior normal.         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   We will continue the opioid monitoring program, this consists of regular clinic visits, examinations, urine drug screen, pill counts as well as use of [place] Controlled Substance Reporting system. A 12 month History has been reviewed on the [place] Controlled Substance Reporting System on 10/27/2021.  2. Diabetic peripheral neuropathy affecting both feet:  Continue current medication regimen with Gabapentin .10/27/2021 3. Ischial Pain : Left:No complaints today. : Continue HEP as Tolerated. Continue to Monitor. 10/27/2021   F/U in 1 month

## 2021-11-24 ENCOUNTER — Encounter: Payer: Self-pay | Admitting: Registered Nurse

## 2021-11-24 ENCOUNTER — Encounter: Payer: Medicare Other | Attending: Physical Medicine & Rehabilitation | Admitting: Registered Nurse

## 2021-11-24 VITALS — BP 155/60 | HR 60 | Ht 67.0 in | Wt 161.0 lb

## 2021-11-24 DIAGNOSIS — E1140 Type 2 diabetes mellitus with diabetic neuropathy, unspecified: Secondary | ICD-10-CM | POA: Diagnosis present

## 2021-11-24 DIAGNOSIS — Z79891 Long term (current) use of opiate analgesic: Secondary | ICD-10-CM | POA: Diagnosis present

## 2021-11-24 DIAGNOSIS — Z86018 Personal history of other benign neoplasm: Secondary | ICD-10-CM | POA: Insufficient documentation

## 2021-11-24 DIAGNOSIS — G894 Chronic pain syndrome: Secondary | ICD-10-CM | POA: Insufficient documentation

## 2021-11-24 DIAGNOSIS — E1149 Type 2 diabetes mellitus with other diabetic neurological complication: Secondary | ICD-10-CM | POA: Diagnosis present

## 2021-11-24 DIAGNOSIS — Z5181 Encounter for therapeutic drug level monitoring: Secondary | ICD-10-CM | POA: Diagnosis present

## 2021-11-24 MED ORDER — OXYCODONE HCL 15 MG PO TABS: 15.0000 mg | ORAL_TABLET | Freq: Three times a day (TID) | ORAL | 0 refills | Status: DC | PRN

## 2021-11-24 NOTE — Progress Notes (Signed)
Subjective:    Patient ID: Troy [name], male    DOB: 1945/12/17, 76 y.o.   MRN: [id number]  HPI: Troy [name] is a 76 y.o. male who returns for follow up appointment for chronic pain and medication refill. He states his pain is located in his right foot. He rates his pain 8. His current exercise regime is walking and  light yard work   Mr. Dugar Morphine equivalent is 67.50 MME.   Last UDS was Performed on 08/29/2021, it was consistent.    Pain Inventory Average Pain 8 Pain Right Now 8 My pain is constant and aching  In the last 24 hours, has pain interfered with the following? General activity 8 Relation with others 8 Enjoyment of life 8 What TIME of day is your pain at its worst? daytime and evening Sleep (in general) Good  Pain is worse with: walking and some activites Pain improves with: rest and medication Relief from Meds: 8  Family History  Problem Relation Age of Onset   Hypertension Mother    Diabetes Mother    Stroke Mother        multiple   Hypertension Father    Stroke Father    Stroke Sister    Stroke Daughter    Social History   Socioeconomic History   Marital status: Married    Spouse name: Not on file   Number of children: Not on file   Years of education: Not on file   Highest education level: Not on file  Occupational History   Not on file  Tobacco Use   Smoking status: Never   Smokeless tobacco: Never  Vaping Use   Vaping Use: Never used  Substance and Sexual Activity   Alcohol use: No    Alcohol/week: 0.0 standard drinks of alcohol   Drug use: No   Sexual activity: Not on file  Other Topics Concern   Not on file  Social History Narrative   Not on file   Social Determinants of Health   Financial Resource Strain: Not on file  Food Insecurity: Not on file  Transportation Needs: Not on file  Physical Activity: Not on file  Stress: Not on file  Social Connections: Not on file   Past Surgical History:  Procedure Laterality Date    CARDIAC SURGERY  2003   stent   [place]   S1 tumor removal by Dr. Annette Stable   VAGAL NERVE STIMULATOR REMOVAL     Past Surgical History:  Procedure Laterality Date   CARDIAC SURGERY  2003   stent   IMPLANTATION VAGAL NERVE STIMULATOR     TRIGGER FINGER RELEASE     TUMOR REMOVAL  2002   S1 tumor removal by Dr. Annette Stable   VAGAL NERVE STIMULATOR REMOVAL     Past Medical History:  Diagnosis Date   Arthritis    Diabetes mellitus    High blood pressure    Plantar fasciitis    BP (!) 155/60   Pulse 60   Ht '5\' 7"'$  (1.702 m)   Wt 161 lb (73 kg)   SpO2 98%   BMI 25.22 kg/m   Opioid Risk Score:   Fall Risk Score:  `1  Depression screen [facility] 2/9     10/27/2021   10:36 AM 09/26/2021   10:43 AM 08/29/2021   10:55 AM 07/25/2021   10:49 AM 06/23/2021  11:00 AM 05/26/2021   10:32 AM 04/28/2021    9:36 AM  Depression screen [facility] 2/9  Decreased Interest 0 0 0 0 0 0 0  Down, Depressed, Hopeless 0 0 0 0 0 0 0  PHQ - 2 Score 0 0 0 0 0 0 0      Review of Systems  Musculoskeletal:        Foot pain  All other systems reviewed and are negative.     Objective:   Physical Exam Vitals and nursing note reviewed.  Constitutional:      Appearance: Normal appearance.  Cardiovascular:     Rate and Rhythm: Normal rate and regular rhythm.     Pulses: Normal pulses.     Heart sounds: Normal heart sounds.  Pulmonary:     Effort: Pulmonary effort is normal.     Breath sounds: Normal breath sounds.  Musculoskeletal:     Cervical back: Normal range of motion and neck supple.     Comments: Normal Muscle Bulk and Muscle Testing Reveals:  Upper Extremities:Full  ROM and Muscle Strength 5/5  Lower Extremities : Full ROM and Muscle Strength 5/5 Arises from chair with ease Narrow Based Gait     Skin:    General: Skin is warm and dry.  Neurological:     Mental Status: He is alert and oriented to person, place, and time.   Psychiatric:        Mood and Affect: Mood normal.        Behavior: Behavior normal.         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   We will continue the opioid monitoring program, this consists of regular clinic visits, examinations, urine drug screen, pill counts as well as use of [place] Controlled Substance Reporting system. A 12 month History has been reviewed on the [place] on 11/24/2021.  2. Diabetic peripheral neuropathy affecting both feet:  Continue current medication regimen with Gabapentin .11/24/2021 3. Ischial Pain : Left:No complaints today. : Continue HEP as Tolerated. Continue to Monitor.11/24/2021   F/U in 1 month

## 2021-12-21 ENCOUNTER — Encounter: Payer: Medicare Other | Attending: Physical Medicine & Rehabilitation | Admitting: Registered Nurse

## 2021-12-21 ENCOUNTER — Encounter: Payer: Self-pay | Admitting: Registered Nurse

## 2021-12-21 VITALS — BP 149/82 | HR 64 | Ht 67.0 in | Wt 160.0 lb

## 2021-12-21 DIAGNOSIS — Z5181 Encounter for therapeutic drug level monitoring: Secondary | ICD-10-CM | POA: Diagnosis not present

## 2021-12-21 DIAGNOSIS — G894 Chronic pain syndrome: Secondary | ICD-10-CM | POA: Diagnosis not present

## 2021-12-21 DIAGNOSIS — E1140 Type 2 diabetes mellitus with diabetic neuropathy, unspecified: Secondary | ICD-10-CM

## 2021-12-21 DIAGNOSIS — Z86018 Personal history of other benign neoplasm: Secondary | ICD-10-CM | POA: Diagnosis not present

## 2021-12-21 DIAGNOSIS — Z79891 Long term (current) use of opiate analgesic: Secondary | ICD-10-CM

## 2021-12-21 DIAGNOSIS — E1149 Type 2 diabetes mellitus with other diabetic neurological complication: Secondary | ICD-10-CM | POA: Diagnosis present

## 2021-12-21 MED ORDER — OXYCODONE HCL 15 MG PO TABS: 15.0000 mg | ORAL_TABLET | Freq: Three times a day (TID) | ORAL | 0 refills | Status: DC | PRN

## 2021-12-21 MED ORDER — GABAPENTIN 400 MG PO CAPS: ORAL_CAPSULE | ORAL | 1 refills | Status: DC

## 2021-12-21 NOTE — Progress Notes (Signed)
Subjective:    Patient ID: Troy [name], male    DOB: 1945/10/22, 76 y.o.   MRN: [id number]  HPI: Troy [name] is a 76 y.o. male who returns for follow up appointment for chronic pain and medication refill. He states his  pain is located in his right foot. He rates his pain 9. His current exercise regime is walking and performing stretching exercises.  Troy [name] Morphine equivalent is 67.50 MME.   Last UDS Performed on 08/29/2021, it was consistent.    Pain Inventory Average Pain 8 Pain Right Now 9 My pain is constant and aching  In the last 24 hours, has pain interfered with the following? General activity 8 Relation with others 8 Enjoyment of life 7 What TIME of day is your pain at its worst? morning , daytime, and evening Sleep (in general) Good  Pain is worse with: walking and some activites Pain improves with: rest and medication Relief from Meds: 9  Family History  Problem Relation Age of Onset   Hypertension Mother    Diabetes Mother    Stroke Mother        multiple   Hypertension Father    Stroke Father    Stroke Sister    Stroke Daughter    Social History   Socioeconomic History   Marital status: Married    Spouse name: Not on file   Number of children: Not on file   Years of education: Not on file   Highest education level: Not on file  Occupational History   Not on file  Tobacco Use   Smoking status: Never   Smokeless tobacco: Never  Vaping Use   Vaping Use: Never used  Substance and Sexual Activity   Alcohol use: No    Alcohol/week: 0.0 standard drinks of alcohol   Drug use: No   Sexual activity: Not on file  Other Topics Concern   Not on file  Social History Narrative   Not on file   Social Determinants of Health   Financial Resource Strain: Not on file  Food Insecurity: Not on file  Transportation Needs: Not on file  Physical Activity: Not on file  Stress: Not on file  Social Connections: Not on file   Past Surgical History:   Procedure Laterality Date   CARDIAC SURGERY  2003   stent   [place]   S1 tumor removal by Dr. Annette Stable   VAGAL NERVE STIMULATOR REMOVAL     Past Surgical History:  Procedure Laterality Date   CARDIAC SURGERY  2003   stent   IMPLANTATION VAGAL NERVE STIMULATOR     TRIGGER FINGER RELEASE     TUMOR REMOVAL  2002   S1 tumor removal by Dr. Annette Stable   VAGAL NERVE STIMULATOR REMOVAL     Past Medical History:  Diagnosis Date   Arthritis    Diabetes mellitus    High blood pressure    Plantar fasciitis    BP (!) 149/82   Pulse 64   Ht '5\' 7"'$  (1.702 m)   Wt 160 lb (72.6 kg)   SpO2 96%   BMI 25.06 kg/m   Opioid Risk Score:   Fall Risk Score:  `1  Depression screen [facility] 2/9     12/21/2021   10:30 AM 10/27/2021   10:36 AM 09/26/2021   10:43 AM 08/29/2021   10:55 AM 07/25/2021  10:49 AM 06/23/2021   11:00 AM 05/26/2021   10:32 AM  Depression screen PHQ 2/9  Decreased Interest 0 0 0 0 0 0 0  Down, Depressed, Hopeless 0 0 0 0 0 0 0  PHQ - 2 Score 0 0 0 0 0 0 0      Review of Systems  Musculoskeletal:  Positive for gait problem.  All other systems reviewed and are negative.     Objective:   Physical Exam Vitals and nursing note reviewed.  Constitutional:      Appearance: Normal appearance.  Cardiovascular:     Rate and Rhythm: Normal rate and regular rhythm.     Pulses: Normal pulses.     Heart sounds: Normal heart sounds.  Pulmonary:     Effort: Pulmonary effort is normal.     Breath sounds: Normal breath sounds.  Musculoskeletal:     Cervical back: Normal range of motion and neck supple.     Comments: Normal Muscle Bulk and Muscle Testing Reveals:  Upper Extremities: Full ROM and Muscle Strength 5/5  Lower Extremities: Full ROM and Muscle Strength 5/5 Arises from Chair slowly Narrow Based  Gait     Skin:    General: Skin is warm and dry.  Neurological:     Mental Status: He is alert and  oriented to person, place, and time.  Psychiatric:        Mood and Affect: Mood normal.        Behavior: Behavior normal.         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   We will continue the opioid monitoring program, this consists of regular clinic visits, examinations, urine drug screen, pill counts as well as use of [place] Controlled Substance Reporting system. A 12 month History has been reviewed on the [place] Controlled Substance Reporting System on 12/21/2021.  2. Diabetic peripheral neuropathy affecting both feet:  Continue current medication regimen with Gabapentin .12/21/2021 3. Ischial Pain : Left:No complaints today. : Continue HEP as Tolerated. Continue to Monitor.12/21/2021   F/U in 1 month

## 2021-12-21 NOTE — Progress Notes (Unsigned)
   Subjective:    Patient ID: Troy [name], male    DOB: 14-Oct-1945, 76 y.o.   MRN: [id number]  HPI Pain Inventory Average Pain {NUMBERS; 0-10:5044} Pain Right Now {NUMBERS; 0-10:5044} My pain is {PAIN DESCRIPTION:21022940}  In the last 24 hours, has pain interfered with the following? General activity {NUMBERS; 0-10:5044} Relation with others {NUMBERS; 0-10:5044} Enjoyment of life {NUMBERS; 0-10:5044} What TIME of day is your pain at its worst? {time of day:24191} Sleep (in general) {BHH GOOD/FAIR/POOR:22877}  Pain is worse with: {ACTIVITIES:21022942} Pain improves with: {PAIN IMPROVES TSVX:79390300} Relief from Meds: {NUMBERS; 0-10:5044}  Family History  Problem Relation Age of Onset   Hypertension Mother    Diabetes Mother    Stroke Mother        multiple   Hypertension Father    Stroke Father    Stroke Sister    Stroke Daughter    Social History   Socioeconomic History   Marital status: Married    Spouse name: Not on file   Number of children: Not on file   Years of education: Not on file   Highest education level: Not on file  Occupational History   Not on file  Tobacco Use   Smoking status: Never   Smokeless tobacco: Never  Vaping Use   Vaping Use: Never used  Substance and Sexual Activity   Alcohol use: No    Alcohol/week: 0.0 standard drinks of alcohol   Drug use: No   Sexual activity: Not on file  Other Topics Concern   Not on file  Social History Narrative   Not on file   Social Determinants of Health   Financial Resource Strain: Not on file  Food Insecurity: Not on file  Transportation Needs: Not on file  Physical Activity: Not on file  Stress: Not on file  Social Connections: Not on file   Past Surgical History:  Procedure Laterality Date   CARDIAC SURGERY  2003   stent   [place]   S1 tumor removal by Dr. Annette Stable   VAGAL NERVE STIMULATOR REMOVAL     Past  Surgical History:  Procedure Laterality Date   CARDIAC SURGERY  2003   stent   IMPLANTATION VAGAL NERVE STIMULATOR     TRIGGER FINGER RELEASE     TUMOR REMOVAL  2002   S1 tumor removal by Dr. Annette Stable   VAGAL NERVE STIMULATOR REMOVAL     Past Medical History:  Diagnosis Date   Arthritis    Diabetes mellitus    High blood pressure    Plantar fasciitis    There were no vitals taken for this visit.  Opioid Risk Score:   Fall Risk Score:  `1  Depression screen [place] 2/9     10/27/2021   10:36 AM 09/26/2021   10:43 AM 08/29/2021   10:55 AM 07/25/2021   10:49 AM 06/23/2021   11:00 AM 05/26/2021   10:32 AM 04/28/2021    9:36 AM  Depression screen [facility] 2/9  Decreased Interest 0 0 0 0 0 0 0  Down, Depressed, Hopeless 0 0 0 0 0 0 0  PHQ - 2 Score 0 0 0 0 0 0 0      Review of Systems     Objective:   Physical Exam        Assessment & Plan:

## 2021-12-22 ENCOUNTER — Other Ambulatory Visit: Payer: Self-pay | Admitting: Registered Nurse

## 2021-12-22 ENCOUNTER — Other Ambulatory Visit (HOSPITAL_COMMUNITY): Payer: Self-pay

## 2021-12-22 MED ORDER — OXYCODONE HCL 15 MG PO TABS
15.0000 mg | ORAL_TABLET | Freq: Three times a day (TID) | ORAL | 0 refills | Status: DC | PRN
  Filled 2021-12-22 – 2021-12-23 (×2): qty 90, 30d supply, fill #0

## 2021-12-22 NOTE — Telephone Encounter (Signed)
PMP was Reviewed.  Oxycodone e-scribed today. Troy [name] is aware of the above.

## 2021-12-22 NOTE — Telephone Encounter (Signed)
Requesting refill for oxycodone

## 2021-12-22 NOTE — Telephone Encounter (Signed)
[facility] sent fax stating Oxycodone on back order. Send to [facility] they have it.

## 2021-12-23 ENCOUNTER — Other Ambulatory Visit (HOSPITAL_COMMUNITY): Payer: Self-pay

## 2022-01-19 ENCOUNTER — Encounter: Payer: Medicare Other | Admitting: Registered Nurse

## 2022-01-19 ENCOUNTER — Telehealth: Payer: Self-pay | Admitting: *Deleted

## 2022-01-19 NOTE — Telephone Encounter (Signed)
[name] called and said Troy [name] cannot make the 140 appt today. He has #32 pills left.

## 2022-01-20 MED ORDER — OXYCODONE HCL 15 MG PO TABS: 15.0000 mg | ORAL_TABLET | Freq: Three times a day (TID) | ORAL | 0 refills | Status: DC | PRN

## 2022-01-20 NOTE — Addendum Note (Signed)
Addended by: Bayard Hugger on: 01/20/2022 09:39 AM   Modules accepted: Orders

## 2022-01-20 NOTE — Telephone Encounter (Signed)
PMP was Reviewed.  Oxycodone e-scribed to pharmacy.  [name] missed his appointment it was re-scheduled.  Call placed and Mrs. Troy [name] verbalizes understanding to the above.

## 2022-01-23 ENCOUNTER — Telehealth: Payer: Self-pay

## 2022-01-23 NOTE — Telephone Encounter (Signed)
Pharmacy sent a fax stating. "Please advise! Oxy 15 is on longterm backorder. May want to send Rx for 5 mg" Troy [name] in [place]. Patient had #32 left on 11/30.

## 2022-01-24 NOTE — Telephone Encounter (Signed)
Call placed to Mrs. Ricard Dillon, no answer. Left message to return the call. To call community pharmacy to locate medication.

## 2022-01-25 ENCOUNTER — Other Ambulatory Visit: Payer: Self-pay

## 2022-01-25 MED ORDER — OXYCODONE HCL 15 MG PO TABS: 15.0000 mg | ORAL_TABLET | Freq: Three times a day (TID) | ORAL | 0 refills | Status: DC | PRN

## 2022-01-25 NOTE — Telephone Encounter (Signed)
[facility] because [facility] current prescription, [facility] has it and would like it sent there before he runs out per pmp last fill per pmp was 12/23/21 Filled  Written  ID  Drug  QTY  Days  Prescriber  RX #  Dispenser  Refill  Daily Dose*  Pymt Type  PMP  12/23/2021 12/22/2021 1  Oxycodone Hcl (Ir) 15 Mg Tab 90.00 30 Eu Tho [id number] Con (0906) 0/0 67.50 MME Other [place]

## 2022-02-08 ENCOUNTER — Encounter: Payer: Medicare Other | Attending: Physical Medicine & Rehabilitation | Admitting: Registered Nurse

## 2022-02-08 ENCOUNTER — Encounter: Payer: Self-pay | Admitting: Registered Nurse

## 2022-02-08 VITALS — BP 181/62 | HR 69 | Ht 67.0 in | Wt 158.0 lb

## 2022-02-08 DIAGNOSIS — E1149 Type 2 diabetes mellitus with other diabetic neurological complication: Secondary | ICD-10-CM | POA: Diagnosis present

## 2022-02-08 DIAGNOSIS — G894 Chronic pain syndrome: Secondary | ICD-10-CM | POA: Diagnosis present

## 2022-02-08 DIAGNOSIS — Z79891 Long term (current) use of opiate analgesic: Secondary | ICD-10-CM | POA: Diagnosis present

## 2022-02-08 DIAGNOSIS — E1140 Type 2 diabetes mellitus with diabetic neuropathy, unspecified: Secondary | ICD-10-CM | POA: Diagnosis present

## 2022-02-08 DIAGNOSIS — I10 Essential (primary) hypertension: Secondary | ICD-10-CM | POA: Diagnosis present

## 2022-02-08 DIAGNOSIS — Z86018 Personal history of other benign neoplasm: Secondary | ICD-10-CM | POA: Insufficient documentation

## 2022-02-08 DIAGNOSIS — Z5181 Encounter for therapeutic drug level monitoring: Secondary | ICD-10-CM | POA: Insufficient documentation

## 2022-02-08 MED ORDER — OXYCODONE HCL 15 MG PO TABS: 15.0000 mg | ORAL_TABLET | Freq: Three times a day (TID) | ORAL | 0 refills | Status: DC | PRN

## 2022-02-08 NOTE — Progress Notes (Signed)
Subjective:    Patient ID: Troy [name], male    DOB: 1946/02/19, 76 y.o.   MRN: [id number]  HPI: Troy [name] is a 76 y.o. male who returns for follow up appointment for chronic pain and medication refill. He states his pain is located in his right foot. He rates his pain 9. His current exercise regime is walking and performing stretching exercises.  Mr. Pooley Morphine equivalent is 67.50 MME.   Last UDS was Performed on 08/29/2021, it was consistent.    Pain Inventory Average Pain 9 Pain Right Now 9 My pain is constant, aching, and numbness  In the last 24 hours, has pain interfered with the following? General activity 9 Relation with others 9 Enjoyment of life 9 What TIME of day is your pain at its worst? morning , daytime, and evening Sleep (in general) Good  Pain is worse with: walking and some activites Pain improves with: rest and medication Relief from Meds: 9  Family History  Problem Relation Age of Onset   Hypertension Mother    Diabetes Mother    Stroke Mother        multiple   Hypertension Father    Stroke Father    Stroke Sister    Stroke Daughter    Social History   Socioeconomic History   Marital status: Married    Spouse name: Not on file   Number of children: Not on file   Years of education: Not on file   Highest education level: Not on file  Occupational History   Not on file  Tobacco Use   Smoking status: Never   Smokeless tobacco: Never  Vaping Use   Vaping Use: Never used  Substance and Sexual Activity   Alcohol use: No    Alcohol/week: 0.0 standard drinks of alcohol   Drug use: No   Sexual activity: Not on file  Other Topics Concern   Not on file  Social History Narrative   Not on file   Social Determinants of Health   Financial Resource Strain: Not on file  Food Insecurity: Not on file  Transportation Needs: Not on file  Physical Activity: Not on file  Stress: Not on file  Social Connections: Not on file   Past Surgical  History:  Procedure Laterality Date   CARDIAC SURGERY  2003   stent   [place]   S1 tumor removal by Dr. Annette Stable   VAGAL NERVE STIMULATOR REMOVAL     Past Surgical History:  Procedure Laterality Date   CARDIAC SURGERY  2003   stent   IMPLANTATION VAGAL NERVE STIMULATOR     TRIGGER FINGER RELEASE     TUMOR REMOVAL  2002   S1 tumor removal by Dr. Annette Stable   VAGAL NERVE STIMULATOR REMOVAL     Past Medical History:  Diagnosis Date   Arthritis    Diabetes mellitus    High blood pressure    Plantar fasciitis    There were no vitals taken for this visit.  Opioid Risk Score:   Fall Risk Score:  `1  Depression screen [facility] 2/9     12/21/2021   10:30 AM 10/27/2021   10:36 AM 09/26/2021   10:43 AM 08/29/2021   10:55 AM 07/25/2021   10:49 AM 06/23/2021   11:00 AM 05/26/2021   10:32 AM  Depression screen [facility] 2/9  Decreased Interest 0 0  0 0 0 0 0  Down, Depressed, Hopeless 0 0 0 0 0 0 0  PHQ - 2 Score 0 0 0 0 0 0 0    Review of Systems  Musculoskeletal:        Right foot pain  All other systems reviewed and are negative.      Objective:   Physical Exam Vitals and nursing note reviewed.  Constitutional:      Appearance: Normal appearance.  Cardiovascular:     Rate and Rhythm: Normal rate and regular rhythm.     Pulses: Normal pulses.     Heart sounds: Normal heart sounds.  Pulmonary:     Effort: Pulmonary effort is normal.     Breath sounds: Normal breath sounds.  Musculoskeletal:     Cervical back: Normal range of motion and neck supple.     Comments: Normal Muscle Bulk and Muscle Testing Reveals:  Upper Extremities: Full ROM and Muscle Strength 5/5  Lower Extremities: Full ROM and Muscle Strength 5/5 Arises from Chair with ease Narrow Based  Gait     Skin:    General: Skin is warm and dry.  Neurological:     Mental Status: He is alert and oriented to person, place, and time.  Psychiatric:         Mood and Affect: Mood normal.        Behavior: Behavior normal.         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   We will continue the opioid monitoring program, this consists of regular clinic visits, examinations, urine drug screen, pill counts as well as use of [place] Controlled Substance Reporting system. A 12 month History has been reviewed on the [place] Controlled Substance Reporting System on 02/08/2022.  2. Diabetic peripheral neuropathy affecting both feet:  Continue current medication regimen with Gabapentin .02/08/2022 3. Ischial Pain : Left:No complaints today. : Continue HEP as Tolerated. Continue to Monitor.02/08/2022   F/U in 1 month

## 2022-02-23 ENCOUNTER — Ambulatory Visit: Payer: Medicare Other | Admitting: Registered Nurse

## 2022-03-16 ENCOUNTER — Encounter: Payer: Self-pay | Admitting: Registered Nurse

## 2022-03-16 ENCOUNTER — Encounter: Payer: Medicare Other | Attending: Physical Medicine & Rehabilitation | Admitting: Registered Nurse

## 2022-03-16 VITALS — BP 162/50 | HR 64 | Ht 67.0 in | Wt 158.0 lb

## 2022-03-16 DIAGNOSIS — E1149 Type 2 diabetes mellitus with other diabetic neurological complication: Secondary | ICD-10-CM | POA: Insufficient documentation

## 2022-03-16 DIAGNOSIS — Z86018 Personal history of other benign neoplasm: Secondary | ICD-10-CM | POA: Diagnosis present

## 2022-03-16 DIAGNOSIS — Z79891 Long term (current) use of opiate analgesic: Secondary | ICD-10-CM | POA: Insufficient documentation

## 2022-03-16 DIAGNOSIS — E1140 Type 2 diabetes mellitus with diabetic neuropathy, unspecified: Secondary | ICD-10-CM | POA: Diagnosis present

## 2022-03-16 DIAGNOSIS — G894 Chronic pain syndrome: Secondary | ICD-10-CM | POA: Diagnosis present

## 2022-03-16 DIAGNOSIS — Z5181 Encounter for therapeutic drug level monitoring: Secondary | ICD-10-CM | POA: Insufficient documentation

## 2022-03-16 DIAGNOSIS — I10 Essential (primary) hypertension: Secondary | ICD-10-CM | POA: Diagnosis present

## 2022-03-16 NOTE — Patient Instructions (Signed)
Call or send a My- Chart message on 03/23/2022 or 03/24/2022 Ask Troy [name] they have your Oxycodone 15 mg in stock

## 2022-03-16 NOTE — Progress Notes (Signed)
Subjective:    Patient ID: Troy [name], male    DOB: 12/03/1945, 77 y.o.   MRN: [id number]  HPI: Troy [name] is a 77 y.o. male who returns for follow up appointment for chronic pain and medication refill. He states his pain is located in his right foot with numbness .He rates his pain 9. His current exercise regime is walking.   He arrived to office with uncontrolled hypertension , blood pressure was re-checked. Mr. Stavola states his PCP increased his hypertensive medication last week.   Mr. Shonk .Morphine equivalent is 67.50 MME.   Last  UDS was Performed on 08/29/2021, it was consistent.    Pain Inventory Average Pain 9 Pain Right Now 9 My pain is constant, tingling, and aching  In the last 24 hours, has pain interfered with the following? General activity 9 Relation with others 9 Enjoyment of life 8 What TIME of day is your pain at its worst? daytime, evening, and night Sleep (in general) Good  Pain is worse with: walking and sitting Pain improves with: rest and medication Relief from Meds: 8  Family History  Problem Relation Age of Onset   Hypertension Mother    Diabetes Mother    Stroke Mother        multiple   Hypertension Father    Stroke Father    Stroke Sister    Stroke Daughter    Social History   Socioeconomic History   Marital status: Married    Spouse name: Not on file   Number of children: Not on file   Years of education: Not on file   Highest education level: Not on file  Occupational History   Not on file  Tobacco Use   Smoking status: Never   Smokeless tobacco: Never  Vaping Use   Vaping Use: Never used  Substance and Sexual Activity   Alcohol use: No    Alcohol/week: 0.0 standard drinks of alcohol   Drug use: No   Sexual activity: Not on file  Other Topics Concern   Not on file  Social History Narrative   Not on file   Social Determinants of Health   Financial Resource Strain: Not on file  Food Insecurity: Not on file   Transportation Needs: Not on file  Physical Activity: Not on file  Stress: Not on file  Social Connections: Not on file   Past Surgical History:  Procedure Laterality Date   CARDIAC SURGERY  2003   stent   [place]   S1 tumor removal by Dr. Annette Stable   VAGAL NERVE STIMULATOR REMOVAL     Past Surgical History:  Procedure Laterality Date   CARDIAC SURGERY  2003   stent   IMPLANTATION VAGAL NERVE STIMULATOR     TRIGGER FINGER RELEASE     TUMOR REMOVAL  2002   S1 tumor removal by Dr. Annette Stable   VAGAL NERVE STIMULATOR REMOVAL     Past Medical History:  Diagnosis Date   Arthritis    Diabetes mellitus    High blood pressure    Plantar fasciitis    There were no vitals taken for this visit.  Opioid Risk Score:   Fall Risk Score:  `1  Depression screen [facility] 2/9     02/08/2022    9:49 AM 12/21/2021   10:30 AM 10/27/2021   10:36 AM 09/26/2021   10:43 AM 08/29/2021  10:55 AM 07/25/2021   10:49 AM 06/23/2021   11:00 AM  Depression screen [facility] 2/9  Decreased Interest 0 0 0 0 0 0 0  Down, Depressed, Hopeless 0 0 0 0 0 0 0  PHQ - 2 Score 0 0 0 0 0 0 0    Review of Systems  Musculoskeletal:        Right foot pain  All other systems reviewed and are negative.      Objective:   Physical Exam Vitals and nursing note reviewed.  Constitutional:      Appearance: Normal appearance.  Cardiovascular:     Rate and Rhythm: Normal rate and regular rhythm.     Pulses: Normal pulses.     Heart sounds: Normal heart sounds.  Pulmonary:     Effort: Pulmonary effort is normal.     Breath sounds: Normal breath sounds.  Musculoskeletal:     Cervical back: Normal range of motion and neck supple.     Comments: Normal Muscle Bulk and Muscle Testing Reveals:  Upper Extremities: Full ROM and Muscle Strength 5/5  Lower Extremities: Full ROM and Muscle Strength 5/5 Right Lower Extremity Flexion Produces Pain into his Right  Foot Arises from Table with ease Narrow Based  Gait     Skin:    General: Skin is warm and dry.  Neurological:     Mental Status: He is alert and oriented to person, place, and time.  Psychiatric:        Mood and Affect: Mood normal.        Behavior: Behavior normal.         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   We will continue the opioid monitoring program, this consists of regular clinic visits, examinations, urine drug screen, pill counts as well as use of [place] Controlled Substance Reporting system. A 12 month History has been reviewed on the [place] Controlled Substance Reporting System on 03/16/2022.  2. Diabetic peripheral neuropathy affecting both feet:  Continue current medication regimen with Gabapentin .03/16/2022 3. Ischial Pain : Left:No complaints today. : Continue HEP as Tolerated. Continue to Monitor.03/16/2022   F/U in 1 month

## 2022-03-24 ENCOUNTER — Telehealth: Payer: Self-pay | Admitting: Registered Nurse

## 2022-03-24 NOTE — Telephone Encounter (Signed)
PMP was Reviewed.  [facility] was called ,  Oxycodone 15 mg

## 2022-03-24 NOTE — Telephone Encounter (Signed)
Patient's wife wanted to let Zella Ball know that [facility] has oxycodone full dose, so now he doesn't have to cut them.  Please call patient.

## 2022-03-24 NOTE — Telephone Encounter (Signed)
Call was placed to [facility] ,  Troy [name] has a prescription at the pharmacy. Call placed to Troy [name], no answer.  Left a message to return the call.

## 2022-04-20 ENCOUNTER — Encounter: Payer: Self-pay | Admitting: Registered Nurse

## 2022-04-20 ENCOUNTER — Encounter: Payer: Medicare Other | Attending: Physical Medicine & Rehabilitation | Admitting: Registered Nurse

## 2022-04-20 VITALS — BP 162/51 | HR 63 | Ht 67.0 in | Wt 158.0 lb

## 2022-04-20 DIAGNOSIS — Z79891 Long term (current) use of opiate analgesic: Secondary | ICD-10-CM | POA: Diagnosis present

## 2022-04-20 DIAGNOSIS — Z86018 Personal history of other benign neoplasm: Secondary | ICD-10-CM | POA: Insufficient documentation

## 2022-04-20 DIAGNOSIS — G894 Chronic pain syndrome: Secondary | ICD-10-CM | POA: Insufficient documentation

## 2022-04-20 DIAGNOSIS — Z5181 Encounter for therapeutic drug level monitoring: Secondary | ICD-10-CM | POA: Insufficient documentation

## 2022-04-20 DIAGNOSIS — E1140 Type 2 diabetes mellitus with diabetic neuropathy, unspecified: Secondary | ICD-10-CM | POA: Diagnosis present

## 2022-04-20 DIAGNOSIS — E1149 Type 2 diabetes mellitus with other diabetic neurological complication: Secondary | ICD-10-CM | POA: Diagnosis present

## 2022-04-20 DIAGNOSIS — I10 Essential (primary) hypertension: Secondary | ICD-10-CM | POA: Diagnosis present

## 2022-04-20 MED ORDER — OXYCODONE HCL 15 MG PO TABS: 15.0000 mg | ORAL_TABLET | Freq: Three times a day (TID) | ORAL | 0 refills | Status: DC | PRN

## 2022-04-20 NOTE — Progress Notes (Signed)
Subjective:    Patient ID: Troy [name], male    DOB: 28-Feb-1945, 77 y.o.   MRN: [id number]  HPI: Troy [name] is a 77 y.o. male who returns for follow up appointment for chronic pain and medication refill. He states his pain is located in his right foot. He  rates his pain 9. His current exercise regime is walking and performing stretching exercises.  Troy [name] arrived to office with uncontrolled hypertension, he reports he is compliant with his medication. Blood pressure was re-checked, he will keep a blood pressure flow sheet and follow up with his PCP, he verbalizes understanding.  Troy [name] equivalent is 67.50 MME.   UDS ordered today.    Pain Inventory Average Pain 9 Pain Right Now 9 My pain is constant, aching, and warm and sometimes numb  In the last 24 hours, has pain interfered with the following? General activity 9 Relation with others 9 Enjoyment of life 10 What TIME of day is your pain at its worst? daytime and evening Sleep (in general) Good  Pain is worse with: walking, sitting, and some activites Pain improves with: rest and medication Relief from Meds: 9  Family History  Problem Relation Age of Onset   Hypertension Mother    Diabetes Mother    Stroke Mother        multiple   Hypertension Father    Stroke Father    Stroke Sister    Stroke Daughter    Social History   Socioeconomic History   Marital status: Married    Spouse name: Not on file   Number of children: Not on file   Years of education: Not on file   Highest education level: Not on file  Occupational History   Not on file  Tobacco Use   Smoking status: Never   Smokeless tobacco: Never  Vaping Use   Vaping Use: Never used  Substance and Sexual Activity   Alcohol use: No    Alcohol/week: 0.0 standard drinks of alcohol   Drug use: No   Sexual activity: Not on file  Other Topics Concern   Not on file  Social History Narrative   Not on file   Social Determinants of Health    Financial Resource Strain: Not on file  Food Insecurity: Not on file  Transportation Needs: Not on file  Physical Activity: Not on file  Stress: Not on file  Social Connections: Not on file   Past Surgical History:  Procedure Laterality Date   CARDIAC SURGERY  2003   stent   [place]   S1 tumor removal by Dr. Annette Stable   VAGAL NERVE STIMULATOR REMOVAL     Past Surgical History:  Procedure Laterality Date   CARDIAC SURGERY  2003   stent   IMPLANTATION VAGAL NERVE STIMULATOR     TRIGGER FINGER RELEASE     TUMOR REMOVAL  2002   S1 tumor removal by Dr. Annette Stable   VAGAL NERVE STIMULATOR REMOVAL     Past Medical History:  Diagnosis Date   Arthritis    Diabetes mellitus    High blood pressure    Plantar fasciitis    There were no vitals taken for this visit.  Opioid Risk Score:   Fall Risk Score:  `1  Depression screen [facility] 2/9     03/16/2022   10:31 AM 02/08/2022    9:49  AM 12/21/2021   10:30 AM 10/27/2021   10:36 AM 09/26/2021   10:43 AM 08/29/2021   10:55 AM 07/25/2021   10:49 AM  Depression screen [facility] 2/9  Decreased Interest 2 0 0 0 0 0 0  Down, Depressed, Hopeless 0 0 0 0 0 0 0  PHQ - 2 Score 2 0 0 0 0 0 0    Review of Systems  Musculoskeletal:        Right foot pain  All other systems reviewed and are negative.      Objective:   Physical Exam Vitals and nursing note reviewed.  Constitutional:      Appearance: Normal appearance.  Cardiovascular:     Rate and Rhythm: Normal rate and regular rhythm.     Pulses: Normal pulses.     Heart sounds: Normal heart sounds.  Pulmonary:     Effort: Pulmonary effort is normal.     Breath sounds: Normal breath sounds.  Musculoskeletal:     Cervical back: Normal range of motion and neck supple.     Comments: Normal Muscle Bulk and Muscle Testing Reveals:  Upper Extremities: Full ROM and Muscle Strength 5/5 Lower Extremities: Full ROM and Muscle  Strength 5/5 Arises from chair with ease Narrow Based  Gait     Skin:    General: Skin is warm and dry.  Neurological:     Mental Status: He is alert and oriented to person, place, and time.  Psychiatric:        Mood and Affect: Mood normal.        Behavior: Behavior normal.         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   We will continue the opioid monitoring program, this consists of regular clinic visits, examinations, urine drug screen, pill counts as well as use of [place] Controlled Substance Reporting system. A 12 month History has been reviewed on the [place] Controlled Substance Reporting System on 04/20/2022.  2. Diabetic peripheral neuropathy affecting both feet:  Continue current medication regimen with Gabapentin .04/20/2022 3. Ischial Pain : Left:No complaints today. : Continue HEP as Tolerated. Continue to Monitor.04/20/2022 4. Uncontrolled Hypertension: Troy [name] states he's compliant with his antihypertensive medications, blood pressure was rechecked. He will keep a blood pressure flowsheet and F/U with his PCP. He verbalizes understanding.   F/U in 1 month

## 2022-04-26 ENCOUNTER — Telehealth: Payer: Self-pay | Admitting: *Deleted

## 2022-04-26 LAB — TOXASSURE SELECT,+ANTIDEPR,UR

## 2022-04-26 NOTE — Telephone Encounter (Signed)
Urine drug screen for this encounter is consistent for prescribed medication 

## 2022-05-11 ENCOUNTER — Encounter: Payer: Medicare Other | Admitting: Registered Nurse

## 2022-05-12 ENCOUNTER — Telehealth: Payer: Self-pay | Admitting: Registered Nurse

## 2022-05-12 MED ORDER — OXYCODONE HCL 15 MG PO TABS: 15.0000 mg | ORAL_TABLET | Freq: Three times a day (TID) | ORAL | 0 refills | Status: DC | PRN

## 2022-05-12 NOTE — Telephone Encounter (Signed)
PMP was Reviewed.  Oxycodone e-scribed to pharmacy.  

## 2022-06-07 ENCOUNTER — Encounter: Payer: Self-pay | Admitting: Registered Nurse

## 2022-06-07 ENCOUNTER — Encounter: Payer: Medicare Other | Attending: Physical Medicine & Rehabilitation | Admitting: Registered Nurse

## 2022-06-07 VITALS — BP 186/61 | HR 66 | Ht 67.0 in | Wt 160.0 lb

## 2022-06-07 DIAGNOSIS — I10 Essential (primary) hypertension: Secondary | ICD-10-CM | POA: Diagnosis present

## 2022-06-07 DIAGNOSIS — G894 Chronic pain syndrome: Secondary | ICD-10-CM | POA: Insufficient documentation

## 2022-06-07 DIAGNOSIS — Z86018 Personal history of other benign neoplasm: Secondary | ICD-10-CM | POA: Diagnosis present

## 2022-06-07 DIAGNOSIS — Z79891 Long term (current) use of opiate analgesic: Secondary | ICD-10-CM | POA: Diagnosis present

## 2022-06-07 DIAGNOSIS — E1149 Type 2 diabetes mellitus with other diabetic neurological complication: Secondary | ICD-10-CM | POA: Diagnosis present

## 2022-06-07 DIAGNOSIS — Z5181 Encounter for therapeutic drug level monitoring: Secondary | ICD-10-CM | POA: Diagnosis not present

## 2022-06-07 DIAGNOSIS — E1140 Type 2 diabetes mellitus with diabetic neuropathy, unspecified: Secondary | ICD-10-CM | POA: Insufficient documentation

## 2022-06-07 MED ORDER — OXYCODONE HCL 15 MG PO TABS: 15.0000 mg | ORAL_TABLET | Freq: Three times a day (TID) | ORAL | 0 refills | Status: DC | PRN

## 2022-06-07 MED ORDER — GABAPENTIN 400 MG PO CAPS: ORAL_CAPSULE | ORAL | 1 refills | Status: DC

## 2022-06-07 NOTE — Patient Instructions (Signed)
2

## 2022-06-07 NOTE — Progress Notes (Signed)
Subjective:    Patient ID: Troy [name], male    DOB: 21-Feb-1946, 77 y.o.   MRN: [id number]  HPI: Troy [name] is a 77 y.o. male who returns for follow up appointment for chronic pain and medication refill. He states his pain is located in his right foot. He rates his pain 9. His current exercise regime is walking, yard work  and performing stretching exercises.  Troy [name] arrived hypertensive, he states he is compliant with his medications. He refuses ED or Urgent Care evaluation. He was instructed to keep blood pressure log and F/U with his PCP. He verbalizes understanding.  Troy [name] Morphine equivalent is 67.50 MME.   Last UDS was Performed on 04/20/2022, it was consistent.      Pain Inventory Average Pain 9 Pain Right Now 9 My pain is constant and aching  In the last 24 hours, has pain interfered with the following? General activity 8 Relation with others 8 Enjoyment of life 8 What TIME of day is your pain at its worst? morning  and daytime Sleep (in general) Good  Pain is worse with: walking and some activites Pain improves with: rest and medication Relief from Meds: 8  Family History  Problem Relation Age of Onset   Hypertension Mother    Diabetes Mother    Stroke Mother        multiple   Hypertension Father    Stroke Father    Stroke Sister    Stroke Daughter    Social History   Socioeconomic History   Marital status: Married    Spouse name: Not on file   Number of children: Not on file   Years of education: Not on file   Highest education level: Not on file  Occupational History   Not on file  Tobacco Use   Smoking status: Never   Smokeless tobacco: Never  Vaping Use   Vaping Use: Never used  Substance and Sexual Activity   Alcohol use: No    Alcohol/week: 0.0 standard drinks of alcohol   Drug use: No   Sexual activity: Not on file  Other Topics Concern   Not on file  Social History Narrative   Not on file   Social Determinants of Health    Financial Resource Strain: Not on file  Food Insecurity: Not on file  Transportation Needs: Not on file  Physical Activity: Not on file  Stress: Not on file  Social Connections: Not on file   Past Surgical History:  Procedure Laterality Date   CARDIAC SURGERY  2003   stent   IMPLANTATION VAGAL NERVE STIMULATOR     TRIGGER FINGER RELEASE     TUMOR REMOVAL  2002   S1 tumor removal by Dr. Jordan [name]   VAGAL NERVE STIMULATOR REMOVAL     Past Surgical History:  Procedure Laterality Date   CARDIAC SURGERY  2003   stent   IMPLANTATION VAGAL NERVE STIMULATOR     TRIGGER FINGER RELEASE     TUMOR REMOVAL  2002   S1 tumor removal by Dr. Jordan [name]   VAGAL NERVE STIMULATOR REMOVAL     Past Medical History:  Diagnosis Date   Arthritis    Diabetes mellitus    High blood pressure    Plantar fasciitis    Ht  (1.702 m)   BMI 24.75 kg/m   Opioid Risk Score:   Fall Risk Score:  `1  Depression screen [facility] 2/9     04/20/2022  10:46 AM 03/16/2022   10:31 AM 02/08/2022    9:49 AM 12/21/2021   10:30 AM 10/27/2021   10:36 AM 09/26/2021   10:43 AM 08/29/2021   10:55 AM  Depression screen [facility] 2/9  Decreased Interest 0 2 0 0 0 0 0  Down, Depressed, Hopeless 0 0 0 0 0 0 0  PHQ - 2 Score 0 2 0 0 0 0 0    Review of Systems  Musculoskeletal:        Right foot pain  All other systems reviewed and are negative.     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   We will continue the opioid monitoring program, this consists of regular clinic visits, examinations, urine drug screen, pill counts as well as use of [place] Controlled Substance Reporting system. A 12 month History has been reviewed on the [place] Controlled Substance Reporting System on 06/07/2022.  2. Diabetic peripheral neuropathy  affecting both feet:  Continue current medication regimen with Gabapentin .06/07/2022 3. Ischial Pain : Left:No complaints today. : Continue HEP as Tolerated. Continue to Monitor.06/07/2022 4. Uncontrolled Hypertension: Troy [name] states he's compliant with his antihypertensive medications, blood pressure was rechecked. He will keep a blood pressure flowsheet and F/U with his PCP. He verbalizes understanding.    F/U in 1 month

## 2022-06-09 ENCOUNTER — Ambulatory Visit: Payer: Medicare Other | Admitting: Registered Nurse

## 2022-06-16 ENCOUNTER — Ambulatory Visit: Payer: Medicare Other | Admitting: Registered Nurse

## 2022-07-13 ENCOUNTER — Encounter: Payer: Self-pay | Admitting: Registered Nurse

## 2022-07-13 ENCOUNTER — Encounter: Payer: Medicare Other | Attending: Physical Medicine & Rehabilitation | Admitting: Registered Nurse

## 2022-07-13 VITALS — BP 163/65 | HR 62 | Ht 67.0 in | Wt 157.0 lb

## 2022-07-13 DIAGNOSIS — Z79891 Long term (current) use of opiate analgesic: Secondary | ICD-10-CM

## 2022-07-13 DIAGNOSIS — M79672 Pain in left foot: Secondary | ICD-10-CM | POA: Diagnosis present

## 2022-07-13 DIAGNOSIS — G894 Chronic pain syndrome: Secondary | ICD-10-CM

## 2022-07-13 DIAGNOSIS — Z5181 Encounter for therapeutic drug level monitoring: Secondary | ICD-10-CM

## 2022-07-13 DIAGNOSIS — Z86018 Personal history of other benign neoplasm: Secondary | ICD-10-CM

## 2022-07-13 DIAGNOSIS — E1140 Type 2 diabetes mellitus with diabetic neuropathy, unspecified: Secondary | ICD-10-CM

## 2022-07-13 DIAGNOSIS — I10 Essential (primary) hypertension: Secondary | ICD-10-CM | POA: Diagnosis present

## 2022-07-13 DIAGNOSIS — Z794 Long term (current) use of insulin: Secondary | ICD-10-CM

## 2022-07-13 DIAGNOSIS — E1149 Type 2 diabetes mellitus with other diabetic neurological complication: Secondary | ICD-10-CM | POA: Diagnosis present

## 2022-07-13 MED ORDER — OXYCODONE HCL 15 MG PO TABS: 15.0000 mg | ORAL_TABLET | Freq: Three times a day (TID) | ORAL | 0 refills | Status: DC | PRN

## 2022-07-13 NOTE — Progress Notes (Signed)
Subjective:    Patient ID: Troy [name], male    DOB: 1946/01/08, 77 y.o.   MRN: [id number]  Troy [name] is a 77 y.o. male who returns for follow up appointment for chronic pain and medication refill. He states his pain is located in his right foot numbness and left heel pain. Troy [name] refuses X-ray at this time. He rates his pain 9. His current exercise regime is walking and performing stretching exercises.  Troy [name] Morphine equivalent is 67.50 MME.   Last UDS was Performed on 04/20/2022, it was consistent   Pain Inventory Average Pain 9 Pain Right Now 9 My pain is constant, aching, and numbness, burning  In the last 24 hours, has pain interfered with the following? General activity 9 Relation with others 8 Enjoyment of life 9 What TIME of day is your pain at its worst? daytime and evening Sleep (in general) Good  Pain is worse with: walking and some activites Pain improves with: rest and medication Relief from Meds: 8  Family History  Problem Relation Age of Onset   Hypertension Mother    Diabetes Mother    Stroke Mother        multiple   Hypertension Father    Stroke Father    Stroke Sister    Stroke Daughter    Social History   Socioeconomic History   Marital status: Married    Spouse name: Not on file   Number of children: Not on file   Years of education: Not on file   Highest education level: Not on file  Occupational History   Not on file  Tobacco Use   Smoking status: Never   Smokeless tobacco: Never  Vaping Use   Vaping Use: Never used  Substance and Sexual Activity   Alcohol use: No    Alcohol/week: 0.0 standard drinks of alcohol   Drug use: No   Sexual activity: Not on file  Other Topics Concern   Not on file  Social History Narrative   Not on file   Social Determinants of Health   Financial Resource Strain: Not on file  Food Insecurity: Not on file  Transportation Needs: Not on file  Physical Activity: Not on file  Stress: Not  on file  Social Connections: Not on file   Past Surgical History:  Procedure Laterality Date   CARDIAC SURGERY  2003   stent   IMPLANTATION VAGAL NERVE STIMULATOR     TRIGGER FINGER RELEASE     TUMOR REMOVAL  2002   S1 tumor removal by Troy [name]   VAGAL NERVE STIMULATOR REMOVAL     Past Surgical History:  Procedure Laterality Date   CARDIAC SURGERY  2003   stent   IMPLANTATION VAGAL NERVE STIMULATOR     TRIGGER FINGER RELEASE     TUMOR REMOVAL  2002   S1 tumor removal by Troy [name]   VAGAL NERVE STIMULATOR REMOVAL     Past Medical History:  Diagnosis Date   Arthritis    Diabetes mellitus    High blood pressure    Plantar fasciitis    BP (!) 153/58   Pulse 62   Ht 5\' 7"  (1.702 m)   Wt 157 lb (71.2 kg)   SpO2 98%   BMI 24.59 kg/m   Opioid Risk Score:   Fall Risk Score:  `1  Depression screen [facility] 2/9     07/13/2022   10:33 AM 04/20/2022   10:46 AM 03/16/2022  10:31 AM 02/08/2022    9:49 AM 12/21/2021   10:30 AM 10/27/2021   10:36 AM 09/26/2021   10:43 AM  Depression screen PHQ 2/9  Decreased Interest  0 2 0 0 0 0  Down, Depressed, Hopeless 0 0 0 0 0 0 0  PHQ - 2 Score 0 0 2 0 0 0 0  Altered sleeping 0        PHQ-9 Score 0          Review of Systems  Musculoskeletal:        Right foot pain  All other systems reviewed and are negative.     Objective:   Physical Exam Vitals and nursing note reviewed.  Constitutional:      Appearance: Normal appearance.  Cardiovascular:     Rate and Rhythm: Normal rate and regular rhythm.     Pulses: Normal pulses.     Heart sounds: Normal heart sounds.  Pulmonary:     Effort: Pulmonary effort is normal.     Breath sounds: Normal breath sounds.  Musculoskeletal:     Cervical back: Normal range of motion and neck supple.     Comments: Normal Muscle Bulk and Muscle Testing Reveals:  Upper Extremities: Full ROM and Muscle Strength 5/5   Lower Extremities: Full ROM and Muscle 5/5 Arises from table slowly  Antalgic  Gait      Skin:    General: Skin is warm and dry.  Neurological:     Mental Status: He is alert and oriented to person, place, and time.  Psychiatric:        Mood and Affect: Mood normal.        Behavior: Behavior normal.         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   We will continue the opioid monitoring program, this consists of regular clinic visits, examinations, urine drug screen, pill counts as well as use of [place] Controlled Substance Reporting system. A 12 month History has been reviewed on the [place] Controlled Substance Reporting System on 07/13/2022.  2. Diabetic peripheral neuropathy affecting both feet:  Continue current medication regimen with Gabapentin .07/13/2022 3. Ischial Pain : Left:No complaints today. : Continue HEP as Tolerated. Continue to Monitor.07/13/2022 4. Essential Hypertension: Troy [name] states he's compliant with his antihypertensive medications, blood pressure was rechecked. He will keep a blood pressure flowsheet and F/U with his PCP. He verbalizes understanding.  5. Pain in left heel: He refuses X-ray- he will F/U with his PCP.   F/U in 1 month

## 2022-08-18 ENCOUNTER — Encounter: Payer: Self-pay | Admitting: Registered Nurse

## 2022-08-18 ENCOUNTER — Encounter: Payer: Medicare Other | Attending: Physical Medicine & Rehabilitation | Admitting: Registered Nurse

## 2022-08-18 VITALS — BP 133/59 | HR 63 | Ht 67.0 in | Wt 156.0 lb

## 2022-08-18 DIAGNOSIS — G894 Chronic pain syndrome: Secondary | ICD-10-CM | POA: Diagnosis present

## 2022-08-18 DIAGNOSIS — Z79891 Long term (current) use of opiate analgesic: Secondary | ICD-10-CM | POA: Insufficient documentation

## 2022-08-18 DIAGNOSIS — Z86018 Personal history of other benign neoplasm: Secondary | ICD-10-CM | POA: Diagnosis present

## 2022-08-18 DIAGNOSIS — E1140 Type 2 diabetes mellitus with diabetic neuropathy, unspecified: Secondary | ICD-10-CM | POA: Insufficient documentation

## 2022-08-18 DIAGNOSIS — Z5181 Encounter for therapeutic drug level monitoring: Secondary | ICD-10-CM | POA: Insufficient documentation

## 2022-08-18 DIAGNOSIS — Z794 Long term (current) use of insulin: Secondary | ICD-10-CM

## 2022-08-18 DIAGNOSIS — E1149 Type 2 diabetes mellitus with other diabetic neurological complication: Secondary | ICD-10-CM | POA: Insufficient documentation

## 2022-08-18 MED ORDER — OXYCODONE HCL 15 MG PO TABS: 15.0000 mg | ORAL_TABLET | Freq: Three times a day (TID) | ORAL | 0 refills | Status: DC | PRN

## 2022-08-18 NOTE — Progress Notes (Signed)
Subjective:    Patient ID: Troy [name], male    DOB: 1945-08-28, 77 y.o.   MRN: [id number]  HPI: Troy [name] is a 77 y.o. male who returns for follow up appointment for chronic pain and medication refill. He states his pain is located in his right foot. He rates his pain 9. His current exercise regime is walking and performing stretching exercises.  Troy [name] states two weeks ago he pulled skin off his right heel, ulcer noted, no drainage or odor noted. Troy [name] is a diabetic, educated on diabetic foot care. He refuses ED or Urgent Care evaluation, he states he will F/U with his PCP.   Troy [name] Morphine equivalent is 67.50 MME.   UDS was Performed on 04/20/2022, it was consistent.    Pain Inventory Average Pain 9 Pain Right Now 9 My pain is constant, burning, and aching  In the last 24 hours, has pain interfered with the following? General activity 8 Relation with others 8 Enjoyment of life 9 What TIME of day is your pain at its worst? daytime and evening Sleep (in general) Good  Pain is worse with: inactivity and some activites Pain improves with: rest and medication Relief from Meds: 8  Family History  Problem Relation Age of Onset   Hypertension Mother    Diabetes Mother    Stroke Mother        multiple   Hypertension Father    Stroke Father    Stroke Sister    Stroke Daughter    Social History   Socioeconomic History   Marital status: Married    Spouse name: Not on file   Number of children: Not on file   Years of education: Not on file   Highest education level: Not on file  Occupational History   Not on file  Tobacco Use   Smoking status: Never   Smokeless tobacco: Never  Vaping Use   Vaping Use: Never used  Substance and Sexual Activity   Alcohol use: No    Alcohol/week: 0.0 standard drinks of alcohol   Drug use: No   Sexual activity: Not on file  Other Topics Concern   Not on file  Social History Narrative   Not on file   Social  Determinants of Health   Financial Resource Strain: Not on file  Food Insecurity: Not on file  Transportation Needs: Not on file  Physical Activity: Not on file  Stress: Not on file  Social Connections: Not on file   Past Surgical History:  Procedure Laterality Date   CARDIAC SURGERY  2003   stent   IMPLANTATION VAGAL NERVE STIMULATOR     TRIGGER FINGER RELEASE     TUMOR REMOVAL  2002   S1 tumor removal by Dr. Jordan Likes   VAGAL NERVE STIMULATOR REMOVAL     Past Surgical History:  Procedure Laterality Date   CARDIAC SURGERY  2003   stent   IMPLANTATION VAGAL NERVE STIMULATOR     TRIGGER FINGER RELEASE     TUMOR REMOVAL  2002   S1 tumor removal by Dr. Jordan Likes   VAGAL NERVE STIMULATOR REMOVAL     Past Medical History:  Diagnosis Date   Arthritis    Diabetes mellitus    High blood pressure    Plantar fasciitis    BP (!) 133/59   Pulse 63   Ht 5\' 7"  (1.702 m)   Wt 156 lb (70.8 kg)   SpO2 96%   BMI 24.43  kg/m   Opioid Risk Score:   Fall Risk Score:  `1  Depression screen [facility] 2/9     07/13/2022   10:33 AM 04/20/2022   10:46 AM 03/16/2022   10:31 AM 02/08/2022    9:49 AM 12/21/2021   10:30 AM 10/27/2021   10:36 AM 09/26/2021   10:43 AM  Depression screen PHQ 2/9  Decreased Interest  0 2 0 0 0 0  Down, Depressed, Hopeless 0 0 0 0 0 0 0  PHQ - 2 Score 0 0 2 0 0 0 0  Altered sleeping 0        PHQ-9 Score 0           Review of Systems  Musculoskeletal:        Right foot pain  All other systems reviewed and are negative.      Objective:   Physical Exam Vitals and nursing note reviewed.  Constitutional:      Appearance: Normal appearance.  Cardiovascular:     Rate and Rhythm: Normal rate and regular rhythm.     Pulses: Normal pulses.     Heart sounds: Normal heart sounds.  Pulmonary:     Effort: Pulmonary effort is normal.     Breath sounds: Normal breath sounds.  Musculoskeletal:     Cervical back: Normal range of motion and neck supple.     Comments: Normal  Muscle Bulk and Muscle Testing Reveals:  Upper Extremities: Full ROM and Muscle Strength 5/5 Lower Extremities: Full ROM and Muscle Strength 5/5 Arises from Chair slowly Antalgic  Gait     Skin:    General: Skin is warm and dry.  Neurological:     Mental Status: He is alert and oriented to person, place, and time.  Psychiatric:        Mood and Affect: Mood normal.        Behavior: Behavior normal.         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   We will continue the opioid monitoring program, this consists of regular clinic visits, examinations, urine drug screen, pill counts as well as use of [place] Controlled Substance Reporting system. A 12 month History has been reviewed on the [place] Controlled Substance Reporting System on 08/18/2022.  2. Diabetic peripheral neuropathy affecting both feet:  Continue current medication regimen with Gabapentin .08/18/2022 3. Ischial Pain : Left:No complaints today. : Continue HEP as Tolerated. Continue to Monitor.08/18/2022 4. Pain in left heel: He refuses ED or Urgent Care evaluation. he will F/U with his PCP.    F/U in 1 month

## 2022-09-20 ENCOUNTER — Other Ambulatory Visit: Payer: Self-pay

## 2022-09-20 ENCOUNTER — Encounter: Payer: Medicare Other | Admitting: Registered Nurse

## 2022-09-25 ENCOUNTER — Encounter: Payer: Medicare Other | Attending: Physical Medicine & Rehabilitation | Admitting: Registered Nurse

## 2022-09-25 ENCOUNTER — Encounter: Payer: Self-pay | Admitting: Registered Nurse

## 2022-09-25 VITALS — BP 163/67 | HR 64 | Ht 67.0 in | Wt 156.2 lb

## 2022-09-25 DIAGNOSIS — E1149 Type 2 diabetes mellitus with other diabetic neurological complication: Secondary | ICD-10-CM | POA: Diagnosis present

## 2022-09-25 DIAGNOSIS — Z86018 Personal history of other benign neoplasm: Secondary | ICD-10-CM | POA: Diagnosis present

## 2022-09-25 DIAGNOSIS — Z79891 Long term (current) use of opiate analgesic: Secondary | ICD-10-CM | POA: Diagnosis present

## 2022-09-25 DIAGNOSIS — I10 Essential (primary) hypertension: Secondary | ICD-10-CM | POA: Diagnosis present

## 2022-09-25 DIAGNOSIS — Z794 Long term (current) use of insulin: Secondary | ICD-10-CM

## 2022-09-25 DIAGNOSIS — G894 Chronic pain syndrome: Secondary | ICD-10-CM | POA: Insufficient documentation

## 2022-09-25 DIAGNOSIS — E1140 Type 2 diabetes mellitus with diabetic neuropathy, unspecified: Secondary | ICD-10-CM | POA: Insufficient documentation

## 2022-09-25 DIAGNOSIS — Z5181 Encounter for therapeutic drug level monitoring: Secondary | ICD-10-CM | POA: Insufficient documentation

## 2022-09-25 MED ORDER — OXYCODONE HCL 15 MG PO TABS: 15.0000 mg | ORAL_TABLET | Freq: Three times a day (TID) | ORAL | 0 refills | Status: DC | PRN

## 2022-09-25 NOTE — Progress Notes (Addendum)
Subjective:    Patient ID: Troy [name], male    DOB: [birth year], 77 y.o.   MRN: [id number]  HPI: Troy [name] is a 77 y.o. male who returns for follow up appointment for chronic pain and medication refill. He states his  pain is located in his right foot. He rates his pain 9. His current exercise regime is walking and performing stretching exercises.  Mr. Owns arrived hypertensive, blood pressure was re-checked. He reports he is compliant with his anti-hypertensive medications.   Mr. Eichel Morphine equivalent is 67.50  MME.   UDS ordered today.     Pain Inventory Average Pain 8 Pain Right Now 9 My pain is constant, burning, dull, and aching  In the last 24 hours, has pain interfered with the following? General activity 8 Relation with others 8 Enjoyment of life 8 What TIME of day is your pain at its worst? evening and night Sleep (in general) Good  Pain is worse with: walking, inactivity, and some activites Pain improves with: rest and medication Relief from Meds: 8  Family History  Problem Relation Age of Onset   Hypertension Mother    Diabetes Mother    Stroke Mother        multiple   Hypertension Father    Stroke Father    Stroke Sister    Stroke Daughter    Social History   Socioeconomic History   Marital status: Married    Spouse name: Not on file   Number of children: Not on file   Years of education: Not on file   Highest education level: Not on file  Occupational History   Not on file  Tobacco Use   Smoking status: Never   Smokeless tobacco: Never  Vaping Use   Vaping status: Never Used  Substance and Sexual Activity   Alcohol use: No    Alcohol/week: 0.0 standard drinks of alcohol   Drug use: No   Sexual activity: Not on file  Other Topics Concern   Not on file  Social History Narrative   Not on file   Social Determinants of Health   Financial Resource Strain: Not on file  Food Insecurity: Not on file  Transportation Needs: Not on file   Physical Activity: Not on file  Stress: Not on file  Social Connections: Not on file   Past Surgical History:  Procedure Laterality Date   CARDIAC SURGERY  2003   stent   IMPLANTATION VAGAL NERVE STIMULATOR     TRIGGER FINGER RELEASE     TUMOR REMOVAL  2002   S1 tumor removal by Dr. Jordan Likes   VAGAL NERVE STIMULATOR REMOVAL     Past Surgical History:  Procedure Laterality Date   CARDIAC SURGERY  2003   stent   IMPLANTATION VAGAL NERVE STIMULATOR     TRIGGER FINGER RELEASE     TUMOR REMOVAL  2002   S1 tumor removal by Dr. Jordan Likes   VAGAL NERVE STIMULATOR REMOVAL     Past Medical History:  Diagnosis Date   Arthritis    Diabetes mellitus    High blood pressure    Plantar fasciitis    BP (!) 163/67   Pulse 64   Ht 5\' 7"  (1.702 m)   Wt 156 lb 3.2 oz (70.9 kg)   SpO2 99%   BMI 24.46 kg/m   Opioid Risk Score:   Fall Risk Score:  `1  Depression screen [facility] 2/9     09/25/2022   10:22  AM 07/13/2022   10:33 AM 04/20/2022   10:46 AM 03/16/2022   10:31 AM 02/08/2022    9:49 AM 12/21/2021   10:30 AM 10/27/2021   10:36 AM  Depression screen PHQ 2/9  Decreased Interest 0  0 2 0 0 0  Down, Depressed, Hopeless 0 0 0 0 0 0 0  PHQ - 2 Score 0 0 0 2 0 0 0  Altered sleeping  0       PHQ-9 Score  0          Review of Systems  Constitutional: Negative.   HENT: Negative.    Eyes: Negative.   Respiratory: Negative.    Cardiovascular: Negative.   Gastrointestinal: Negative.   Endocrine: Negative.   Genitourinary: Negative.   Musculoskeletal:  Positive for gait problem.       Foot pain  Skin: Negative.   Allergic/Immunologic: Negative.   Hematological: Negative.   Psychiatric/Behavioral: Negative.    All other systems reviewed and are negative.      Objective:   Physical Exam Vitals and nursing note reviewed.  Constitutional:      Appearance: Normal appearance.  Cardiovascular:     Rate and Rhythm: Normal rate and regular rhythm.     Pulses: Normal pulses.     Heart  sounds: Normal heart sounds.  Pulmonary:     Effort: Pulmonary effort is normal.     Breath sounds: Normal breath sounds.  Musculoskeletal:     Cervical back: Normal range of motion and neck supple.     Comments: Normal Muscle Bulk and Muscle Testing Reveals:  Upper Extremities: Full ROM and Muscle Strength 5/5 Lower Extremities: Full ROM and Muscle Strength 5/5 Right Lower Extremity Flexion Produces Pain into his right foot Arises from Table with ease Narrow Based  Gait     Skin:    General: Skin is warm and dry.  Neurological:     Mental Status: He is alert and oriented to person, place, and time.  Psychiatric:        Mood and Affect: Mood normal.        Behavior: Behavior normal.         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   We will continue the opioid monitoring program, this consists of regular clinic visits, examinations, urine drug screen, pill counts as well as use of [place] Controlled Substance Reporting system. A 12 month History has been reviewed on the [place] Controlled Substance Reporting System on 09/25/2022.  2. Diabetic peripheral neuropathy affecting both feet:  Continue current medication regimen with Gabapentin .09/25/2022 3. Ischial Pain : Left:No complaints today. : Continue HEP as Tolerated. Continue to Monitor.09/25/2022 4. Essential Hypertension: Blood Pressure was re-checked. Mr. Lamke reports he is compliant with his anti-hypertensive medications. He will F/U with his PCP .    F/U in 1 month

## 2022-10-24 NOTE — Progress Notes (Unsigned)
Subjective:    Patient ID: [name], male    DOB: 1945-06-29, 77 y.o.   MRN: [id number]  HPI: PRANEEL VOGELER is a 77 y.o. male who returns for follow up appointment for chronic pain and medication refill. She states her pain is located in his right foot. He rates his pain 8. His current exercise regime is walking and performing stretching exercises.  Mr. Giraud Morphine equivalent is 67.50 MME.   Last UDS was Performed on 09/25/2022, it was consistent.      Pain Inventory Average Pain 8 Pain Right Now 8 My pain is constant and aching & numb  In the last 24 hours, has pain interfered with the following? General activity 8 Relation with others 8 Enjoyment of life 8 What TIME of day is your pain at its worst? daytime and evening Sleep (in general) Good  Pain is worse with: sitting and some activites Pain improves with: rest and medication Relief from Meds: 8  Family History  Problem Relation Age of Onset   Hypertension Mother    Diabetes Mother    Stroke Mother        multiple   Hypertension Father    Stroke Father    Stroke Sister    Stroke Daughter    Social History   Socioeconomic History   Marital status: Married    Spouse name: Not on file   Number of children: Not on file   Years of education: Not on file   Highest education level: Not on file  Occupational History   Not on file  Tobacco Use   Smoking status: Never   Smokeless tobacco: Never  Vaping Use   Vaping status: Never Used  Substance and Sexual Activity   Alcohol use: No    Alcohol/week: 0.0 standard drinks of alcohol   Drug use: No   Sexual activity: Not on file  Other Topics Concern   Not on file  Social History Narrative   Not on file   Social Determinants of Health   Financial Resource Strain: Not on file  Food Insecurity: Not on file  Transportation Needs: Not on file  Physical Activity: Not on file  Stress: Not on file  Social Connections: Not on file   Past Surgical  History:  Procedure Laterality Date   CARDIAC SURGERY  2003   stent   IMPLANTATION VAGAL NERVE STIMULATOR     TRIGGER FINGER RELEASE     TUMOR REMOVAL  2002   S1 tumor removal by Dr. Jordan Likes   VAGAL NERVE STIMULATOR REMOVAL     Past Surgical History:  Procedure Laterality Date   CARDIAC SURGERY  2003   stent   IMPLANTATION VAGAL NERVE STIMULATOR     TRIGGER FINGER RELEASE     TUMOR REMOVAL  2002   S1 tumor removal by Dr. Jordan Likes   VAGAL NERVE STIMULATOR REMOVAL     Past Medical History:  Diagnosis Date   Arthritis    Diabetes mellitus    High blood pressure    Plantar fasciitis    There were no vitals taken for this visit.  Opioid Risk Score:   Fall Risk Score:  `1  Depression screen [facility] 2/9     09/25/2022   10:22 AM 07/13/2022   10:33 AM 04/20/2022   10:46 AM 03/16/2022   10:31 AM 02/08/2022    9:49 AM 12/21/2021   10:30 AM 10/27/2021   10:36 AM  Depression screen PHQ 2/9  Decreased Interest  0  0 2 0 0 0  Down, Depressed, Hopeless 0 0 0 0 0 0 0  PHQ - 2 Score 0 0 0 2 0 0 0  Altered sleeping  0       PHQ-9 Score  0         Review of Systems  Musculoskeletal:        Right foot pain  All other systems reviewed and are negative.      Objective:   Physical Exam Vitals and nursing note reviewed.  Constitutional:      Appearance: Normal appearance.  Cardiovascular:     Rate and Rhythm: Normal rate and regular rhythm.     Pulses: Normal pulses.     Heart sounds: Normal heart sounds.  Pulmonary:     Effort: Pulmonary effort is normal.     Breath sounds: Normal breath sounds.  Musculoskeletal:     Cervical back: Normal range of motion and neck supple.     Comments: Normal Muscle Bulk and Muscle Testing Reveals:  Upper Extremities: Full ROM and Muscle Strength 5/5  Lower Extremities: Full ROM and Muscle Strength 5/5 Right Lower Extremity Flexion Produces Pain into his Right Foot Arises from Chair with ease Narrow Based  Gait     Skin:    General: Skin is warm  and dry.  Neurological:     Mental Status: He is alert and oriented to person, place, and time.  Psychiatric:        Mood and Affect: Mood normal.        Behavior: Behavior normal.         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   We will continue the opioid monitoring program, this consists of regular clinic visits, examinations, urine drug screen, pill counts as well as use of [place] Controlled Substance Reporting system. A 12 month History has been reviewed on the [place] Controlled Substance Reporting System on 10/26/2022.  2. Diabetic peripheral neuropathy affecting both feet:  Continue current medication regimen with Gabapentin .10/26/2022 3. Ischial Pain : Left:No complaints today. : Continue HEP as Tolerated. Continue to Monitor.10/26/2022 4. Essential Hypertension: Blood Pressure was re-checked. Mr. Urrego reports he is compliant with his anti-hypertensive medications. PCP Following   F/U in 1 month

## 2022-10-26 ENCOUNTER — Encounter: Payer: Medicare Other | Attending: Physical Medicine & Rehabilitation | Admitting: Registered Nurse

## 2022-10-26 ENCOUNTER — Encounter: Payer: Self-pay | Admitting: Registered Nurse

## 2022-10-26 VITALS — BP 155/55 | HR 63 | Ht 67.0 in | Wt 157.0 lb

## 2022-10-26 DIAGNOSIS — E1140 Type 2 diabetes mellitus with diabetic neuropathy, unspecified: Secondary | ICD-10-CM | POA: Insufficient documentation

## 2022-10-26 DIAGNOSIS — Z79891 Long term (current) use of opiate analgesic: Secondary | ICD-10-CM | POA: Diagnosis not present

## 2022-10-26 DIAGNOSIS — Z794 Long term (current) use of insulin: Secondary | ICD-10-CM

## 2022-10-26 DIAGNOSIS — G894 Chronic pain syndrome: Secondary | ICD-10-CM | POA: Insufficient documentation

## 2022-10-26 DIAGNOSIS — Z86018 Personal history of other benign neoplasm: Secondary | ICD-10-CM | POA: Insufficient documentation

## 2022-10-26 DIAGNOSIS — I10 Essential (primary) hypertension: Secondary | ICD-10-CM | POA: Insufficient documentation

## 2022-10-26 DIAGNOSIS — Z5181 Encounter for therapeutic drug level monitoring: Secondary | ICD-10-CM | POA: Diagnosis not present

## 2022-10-26 DIAGNOSIS — E1149 Type 2 diabetes mellitus with other diabetic neurological complication: Secondary | ICD-10-CM | POA: Insufficient documentation

## 2022-10-26 MED ORDER — OXYCODONE HCL 15 MG PO TABS: 15.0000 mg | ORAL_TABLET | Freq: Three times a day (TID) | ORAL | 0 refills | Status: DC | PRN

## 2022-11-22 NOTE — Progress Notes (Deleted)
Subjective:    Patient ID: Troy [name], male    DOB: 1945-09-24, 77 y.o.   MRN: [id number]  HPI: Troy [name] is a 77 y.o. male who returns for follow up appointment for chronic pain and medication refill. He states his  pain is located in his right foot.  He also reports his podiatrist is following his right foot ulcer, he has right foot dressing intact, he refused for this provider to assess. States podiatrist following.  He rates his pain 8. His current exercise regime is walking and performing stretching exercises.  [name] Morphine equivalent is 60/75 MME.   Last UDS was Performed on 09/25/2022, it was consistent.      Pain Inventory Average Pain 10 Pain Right Now 8 My pain is constant, sharp, and aching  In the last 24 hours, has pain interfered with the following? General activity 8 Relation with others 8 Enjoyment of life 8 What TIME of day is your pain at its worst? daytime and evening Sleep (in general) Good  Pain is worse with: inactivity and some activites Pain improves with: rest and medication Relief from Meds: 8  Family History  Problem Relation Age of Onset   Hypertension Mother    Diabetes Mother    Stroke Mother        multiple   Hypertension Father    Stroke Father    Stroke Sister    Stroke Daughter    Social History   Socioeconomic History   Marital status: Married    Spouse name: Not on file   Number of children: Not on file   Years of education: Not on file   Highest education level: Not on file  Occupational History   Not on file  Tobacco Use   Smoking status: Never   Smokeless tobacco: Never  Vaping Use   Vaping status: Never Used  Substance and Sexual Activity   Alcohol use: No    Alcohol/week: 0.0 standard drinks of alcohol   Drug use: No   Sexual activity: Not on file  Other Topics Concern   Not on file  Social History Narrative   Not on file   Social Determinants of Health   Financial Resource Strain: Not on file   Food Insecurity: Not on file  Transportation Needs: Not on file  Physical Activity: Not on file  Stress: Not on file  Social Connections: Not on file   Past Surgical History:  Procedure Laterality Date   CARDIAC SURGERY  2003   stent   IMPLANTATION VAGAL NERVE STIMULATOR     TRIGGER FINGER RELEASE     TUMOR REMOVAL  2002   S1 tumor removal by Dr. Jordan Likes   VAGAL NERVE STIMULATOR REMOVAL     Past Surgical History:  Procedure Laterality Date   CARDIAC SURGERY  2003   stent   IMPLANTATION VAGAL NERVE STIMULATOR     TRIGGER FINGER RELEASE     TUMOR REMOVAL  2002   S1 tumor removal by Dr. Jordan Likes   VAGAL NERVE STIMULATOR REMOVAL     Past Medical History:  Diagnosis Date   Arthritis    Diabetes mellitus    High blood pressure    Plantar fasciitis    There were no vitals taken for this visit.  Opioid Risk Score:   Fall Risk Score:  `1  Depression screen [facility] 2/9     10/26/2022    9:59 AM 09/25/2022   10:22 AM 07/13/2022   10:33  AM 04/20/2022   10:46 AM 03/16/2022   10:31 AM 02/08/2022    9:49 AM 12/21/2021   10:30 AM  Depression screen [facility] 2/9  Decreased Interest 0 0  0 2 0 0  Down, Depressed, Hopeless 0 0 0 0 0 0 0  PHQ - 2 Score 0 0 0 0 2 0 0  Altered sleeping   0      PHQ-9 Score   0        Review of Systems  Musculoskeletal:  Positive for gait problem.       Right foot pain   All other systems reviewed and are negative.     Objective:   Physical Exam Vitals and nursing note reviewed.  Constitutional:      Appearance: Normal appearance.  Cardiovascular:     Rate and Rhythm: Normal rate and regular rhythm.     Pulses: Normal pulses.     Heart sounds: Normal heart sounds.  Pulmonary:     Effort: Pulmonary effort is normal.     Breath sounds: Normal breath sounds.  Musculoskeletal:     Cervical back: Normal range of motion and neck supple.     Comments: Normal Muscle Bulk and Muscle Testing Reveals:  Upper Extremities: Full ROM and Muscle Strength 5/5  Lower  Extremities: Full ROM and Muscle Strength 5/5 Right foot dressing intact  Arises from Table slowly Antalgic  Gait     Skin:    General: Skin is warm and dry.  Neurological:     General: No focal deficit present.     Mental Status: He is alert and oriented to person, place, and time.  Psychiatric:        Mood and Affect: Mood normal.        Behavior: Behavior normal.         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   We will continue the opioid monitoring program, this consists of regular clinic visits, examinations, urine drug screen, pill counts as well as use of [place] Controlled Substance Reporting system. A 12 month History has been reviewed on the [place] Controlled Substance Reporting System on 11/27/2022.  2. Diabetic peripheral neuropathy affecting both feet:  Continue current medication regimen with Gabapentin .11/27/2022 3. Ischial Pain : Left:No complaints today. : Continue HEP as Tolerated. Continue to Monitor.11/27/2022 4. Essential Hypertension: Blood Pressure was re-checked. [name] reports he is compliant with his anti-hypertensive medications. PCP Following   F/U in 1 month

## 2022-11-23 ENCOUNTER — Encounter: Payer: Medicare Other | Admitting: Registered Nurse

## 2022-11-24 NOTE — Progress Notes (Unsigned)
Subjective:    Patient ID: Troy [name], male    DOB: 1945-09-24, 77 y.o.   MRN: [id number]  HPI: Troy [name] is a 77 y.o. male who returns for follow up appointment for chronic pain and medication refill. He states his  pain is located in his right foot.  He also reports his podiatrist is following his right foot ulcer, he has right foot dressing intact, he refused for this provider to assess. States podiatrist following.  He rates his pain 8. His current exercise regime is walking and performing stretching exercises.  Troy [name] Morphine equivalent is 60/75 MME.   Last UDS was Performed on 09/25/2022, it was consistent.      Pain Inventory Average Pain 10 Pain Right Now 8 My pain is constant, sharp, and aching  In the last 24 hours, has pain interfered with the following? General activity 8 Relation with others 8 Enjoyment of life 8 What TIME of day is your pain at its worst? daytime and evening Sleep (in general) Good  Pain is worse with: inactivity and some activites Pain improves with: rest and medication Relief from Meds: 8  Family History  Problem Relation Age of Onset   Hypertension Mother    Diabetes Mother    Stroke Mother        multiple   Hypertension Father    Stroke Father    Stroke Sister    Stroke Daughter    Social History   Socioeconomic History   Marital status: Married    Spouse name: Not on file   Number of children: Not on file   Years of education: Not on file   Highest education level: Not on file  Occupational History   Not on file  Tobacco Use   Smoking status: Never   Smokeless tobacco: Never  Vaping Use   Vaping status: Never Used  Substance and Sexual Activity   Alcohol use: No    Alcohol/week: 0.0 standard drinks of alcohol   Drug use: No   Sexual activity: Not on file  Other Topics Concern   Not on file  Social History Narrative   Not on file   Social Determinants of Health   Financial Resource Strain: Not on file   Food Insecurity: Not on file  Transportation Needs: Not on file  Physical Activity: Not on file  Stress: Not on file  Social Connections: Not on file   Past Surgical History:  Procedure Laterality Date   CARDIAC SURGERY  2003   stent   IMPLANTATION VAGAL NERVE STIMULATOR     TRIGGER FINGER RELEASE     TUMOR REMOVAL  2002   S1 tumor removal by Dr. Jordan [name]   VAGAL NERVE STIMULATOR REMOVAL     Past Surgical History:  Procedure Laterality Date   CARDIAC SURGERY  2003   stent   IMPLANTATION VAGAL NERVE STIMULATOR     TRIGGER FINGER RELEASE     TUMOR REMOVAL  2002   S1 tumor removal by Dr. Jordan [name]   VAGAL NERVE STIMULATOR REMOVAL     Past Medical History:  Diagnosis Date   Arthritis    Diabetes mellitus    High blood pressure    Plantar fasciitis    There were no vitals taken for this visit.  Opioid Risk Score:   Fall Risk Score:  `1  Depression screen [facility] 2/9     10/26/2022    9:59 AM 09/25/2022   10:22 AM 07/13/2022   10:33  AM 04/20/2022   10:46 AM 03/16/2022   10:31 AM 02/08/2022    9:49 AM 12/21/2021   10:30 AM  Depression screen PHQ 2/9  Decreased Interest 0 0  0 2 0 0  Down, Depressed, Hopeless 0 0 0 0 0 0 0  PHQ - 2 Score 0 0 0 0 2 0 0  Altered sleeping   0      PHQ-9 Score   0        Review of Systems  Musculoskeletal:  Positive for gait problem.       Right foot pain   All other systems reviewed and are negative.     Objective:   Physical Exam Vitals and nursing note reviewed.  Constitutional:      Appearance: Normal appearance.  Cardiovascular:     Rate and Rhythm: Normal rate and regular rhythm.     Pulses: Normal pulses.     Heart sounds: Normal heart sounds.  Pulmonary:     Effort: Pulmonary effort is normal.     Breath sounds: Normal breath sounds.  Musculoskeletal:     Cervical back: Normal range of motion and neck supple.     Comments: Normal Muscle Bulk and Muscle Testing Reveals:  Upper Extremities: Full ROM and Muscle Strength 5/5  Lower  Extremities: Full ROM and Muscle Strength 5/5 Right foot dressing intact  Arises from Table slowly Antalgic  Gait     Skin:    General: Skin is warm and dry.  Neurological:     General: No focal deficit present.     Mental Status: He is alert and oriented to person, place, and time.  Psychiatric:        Mood and Affect: Mood normal.        Behavior: Behavior normal.         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   We will continue the opioid monitoring program, this consists of regular clinic visits, examinations, urine drug screen, pill counts as well as use of [place] Controlled Substance Reporting system. A 12 month History has been reviewed on the [place] Controlled Substance Reporting System on 11/27/2022.  2. Diabetic peripheral neuropathy affecting both feet:  Continue current medication regimen with Gabapentin .11/27/2022 3. Ischial Pain : Left:No complaints today. : Continue HEP as Tolerated. Continue to Monitor.11/27/2022 4. Essential Hypertension: Blood Pressure was re-checked. Troy [name] reports he is compliant with his anti-hypertensive medications. PCP Following   F/U in 1 month

## 2022-11-27 ENCOUNTER — Encounter: Payer: Medicare Other | Attending: Physical Medicine & Rehabilitation | Admitting: Registered Nurse

## 2022-11-27 ENCOUNTER — Encounter: Payer: Self-pay | Admitting: Registered Nurse

## 2022-11-27 VITALS — BP 129/56 | Ht 67.0 in | Wt 156.0 lb

## 2022-11-27 DIAGNOSIS — G894 Chronic pain syndrome: Secondary | ICD-10-CM | POA: Diagnosis not present

## 2022-11-27 DIAGNOSIS — E1140 Type 2 diabetes mellitus with diabetic neuropathy, unspecified: Secondary | ICD-10-CM

## 2022-11-27 DIAGNOSIS — E1149 Type 2 diabetes mellitus with other diabetic neurological complication: Secondary | ICD-10-CM

## 2022-11-27 DIAGNOSIS — Z5181 Encounter for therapeutic drug level monitoring: Secondary | ICD-10-CM

## 2022-11-27 DIAGNOSIS — Z86018 Personal history of other benign neoplasm: Secondary | ICD-10-CM

## 2022-11-27 DIAGNOSIS — Z794 Long term (current) use of insulin: Secondary | ICD-10-CM

## 2022-11-27 MED ORDER — OXYCODONE HCL 15 MG PO TABS: 15.0000 mg | ORAL_TABLET | Freq: Three times a day (TID) | ORAL | 0 refills | Status: DC | PRN

## 2022-12-20 NOTE — Progress Notes (Deleted)
Subjective:    Patient ID: Troy [name], male    DOB: 1945-09-24, 77 y.o.   MRN: [id number]  HPI: Troy [name] is a 77 y.o. male who returns for follow up appointment for chronic pain and medication refill. He states his  pain is located in his right foot.  He also reports his podiatrist is following his right foot ulcer, he has right foot dressing intact, he refused for this provider to assess. States podiatrist following.  He rates his pain 8. His current exercise regime is walking and performing stretching exercises.  [name] Morphine equivalent is 60/75 MME.   Last UDS was Performed on 09/25/2022, it was consistent.      Pain Inventory Average Pain 10 Pain Right Now 8 My pain is constant, sharp, and aching  In the last 24 hours, has pain interfered with the following? General activity 8 Relation with others 8 Enjoyment of life 8 What TIME of day is your pain at its worst? daytime and evening Sleep (in general) Good  Pain is worse with: inactivity and some activites Pain improves with: rest and medication Relief from Meds: 8  Family History  Problem Relation Age of Onset   Hypertension Mother    Diabetes Mother    Stroke Mother        multiple   Hypertension Father    Stroke Father    Stroke Sister    Stroke Daughter    Social History   Socioeconomic History   Marital status: Married    Spouse name: Not on file   Number of children: Not on file   Years of education: Not on file   Highest education level: Not on file  Occupational History   Not on file  Tobacco Use   Smoking status: Never   Smokeless tobacco: Never  Vaping Use   Vaping status: Never Used  Substance and Sexual Activity   Alcohol use: No    Alcohol/week: 0.0 standard drinks of alcohol   Drug use: No   Sexual activity: Not on file  Other Topics Concern   Not on file  Social History Narrative   Not on file   Social Determinants of Health   Financial Resource Strain: Not on file   Food Insecurity: Not on file  Transportation Needs: Not on file  Physical Activity: Not on file  Stress: Not on file  Social Connections: Not on file   Past Surgical History:  Procedure Laterality Date   CARDIAC SURGERY  2003   stent   IMPLANTATION VAGAL NERVE STIMULATOR     TRIGGER FINGER RELEASE     TUMOR REMOVAL  2002   S1 tumor removal by Dr. Jordan Likes   VAGAL NERVE STIMULATOR REMOVAL     Past Surgical History:  Procedure Laterality Date   CARDIAC SURGERY  2003   stent   IMPLANTATION VAGAL NERVE STIMULATOR     TRIGGER FINGER RELEASE     TUMOR REMOVAL  2002   S1 tumor removal by Dr. Jordan Likes   VAGAL NERVE STIMULATOR REMOVAL     Past Medical History:  Diagnosis Date   Arthritis    Diabetes mellitus    High blood pressure    Plantar fasciitis    There were no vitals taken for this visit.  Opioid Risk Score:   Fall Risk Score:  `1  Depression screen [facility] 2/9     10/26/2022    9:59 AM 09/25/2022   10:22 AM 07/13/2022   10:33  AM 04/20/2022   10:46 AM 03/16/2022   10:31 AM 02/08/2022    9:49 AM 12/21/2021   10:30 AM  Depression screen [facility] 2/9  Decreased Interest 0 0  0 2 0 0  Down, Depressed, Hopeless 0 0 0 0 0 0 0  PHQ - 2 Score 0 0 0 0 2 0 0  Altered sleeping   0      PHQ-9 Score   0        Review of Systems  Musculoskeletal:  Positive for gait problem.       Right foot pain   All other systems reviewed and are negative.     Objective:   Physical Exam Vitals and nursing note reviewed.  Constitutional:      Appearance: Normal appearance.  Cardiovascular:     Rate and Rhythm: Normal rate and regular rhythm.     Pulses: Normal pulses.     Heart sounds: Normal heart sounds.  Pulmonary:     Effort: Pulmonary effort is normal.     Breath sounds: Normal breath sounds.  Musculoskeletal:     Cervical back: Normal range of motion and neck supple.     Comments: Normal Muscle Bulk and Muscle Testing Reveals:  Upper Extremities: Full ROM and Muscle Strength 5/5  Lower  Extremities: Full ROM and Muscle Strength 5/5 Right foot dressing intact  Arises from Table slowly Antalgic  Gait     Skin:    General: Skin is warm and dry.  Neurological:     General: No focal deficit present.     Mental Status: He is alert and oriented to person, place, and time.  Psychiatric:        Mood and Affect: Mood normal.        Behavior: Behavior normal.         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   We will continue the opioid monitoring program, this consists of regular clinic visits, examinations, urine drug screen, pill counts as well as use of [place] Controlled Substance Reporting system. A 12 month History has been reviewed on the [place] Controlled Substance Reporting System on 11/27/2022.  2. Diabetic peripheral neuropathy affecting both feet:  Continue current medication regimen with Gabapentin .11/27/2022 3. Ischial Pain : Left:No complaints today. : Continue HEP as Tolerated. Continue to Monitor.11/27/2022 4. Essential Hypertension: Blood Pressure was re-checked. [name] reports he is compliant with his anti-hypertensive medications. PCP Following   F/U in 1 month

## 2022-12-22 ENCOUNTER — Encounter: Payer: Medicare Other | Admitting: Registered Nurse

## 2022-12-26 ENCOUNTER — Encounter: Payer: Medicare Other | Attending: Physical Medicine & Rehabilitation | Admitting: Registered Nurse

## 2022-12-26 ENCOUNTER — Encounter: Payer: Self-pay | Admitting: Registered Nurse

## 2022-12-26 VITALS — BP 97/55 | HR 65 | Ht 67.0 in | Wt 150.0 lb

## 2022-12-26 DIAGNOSIS — Z5181 Encounter for therapeutic drug level monitoring: Secondary | ICD-10-CM | POA: Diagnosis not present

## 2022-12-26 DIAGNOSIS — E1140 Type 2 diabetes mellitus with diabetic neuropathy, unspecified: Secondary | ICD-10-CM | POA: Insufficient documentation

## 2022-12-26 DIAGNOSIS — E1149 Type 2 diabetes mellitus with other diabetic neurological complication: Secondary | ICD-10-CM | POA: Insufficient documentation

## 2022-12-26 DIAGNOSIS — Z794 Long term (current) use of insulin: Secondary | ICD-10-CM

## 2022-12-26 DIAGNOSIS — Z86018 Personal history of other benign neoplasm: Secondary | ICD-10-CM | POA: Insufficient documentation

## 2022-12-26 DIAGNOSIS — G894 Chronic pain syndrome: Secondary | ICD-10-CM | POA: Insufficient documentation

## 2022-12-26 DIAGNOSIS — Z79891 Long term (current) use of opiate analgesic: Secondary | ICD-10-CM | POA: Diagnosis present

## 2022-12-26 MED ORDER — OXYCODONE HCL 15 MG PO TABS: 15.0000 mg | ORAL_TABLET | Freq: Three times a day (TID) | ORAL | 0 refills | Status: DC | PRN

## 2022-12-26 NOTE — Progress Notes (Unsigned)
Subjective:    Patient ID: Troy [name], male    DOB: 12/09/1945, 77 y.o.   MRN: [id number]  HPI: Troy [name] is a 77 y.o. male who returns for follow up appointment for chronic pain and medication refill. states *** pain is located in  ***. rates pain ***. current exercise regime is walking and performing stretching exercises.  Troy [name] Morphine equivalent is *** MME.   Last UDS was Performed on 09/25/2022, it was consistent.    Pain Inventory Average Pain 8 Pain Right Now 9 My pain is constant and aching  In the last 24 hours, has pain interfered with the following? General activity 8 Relation with others 8 Enjoyment of life 9 What TIME of day is your pain at its worst? evening Sleep (in general) Good  Pain is worse with: walking, bending, and some activites Pain improves with: rest and medication Relief from Meds: 8  Family History  Problem Relation Age of Onset   Hypertension Mother    Diabetes Mother    Stroke Mother        multiple   Hypertension Father    Stroke Father    Stroke Sister    Stroke Daughter    Social History   Socioeconomic History   Marital status: Married    Spouse name: Not on file   Number of children: Not on file   Years of education: Not on file   Highest education level: Not on file  Occupational History   Not on file  Tobacco Use   Smoking status: Never   Smokeless tobacco: Never  Vaping Use   Vaping status: Never Used  Substance and Sexual Activity   Alcohol use: No    Alcohol/week: 0.0 standard drinks of alcohol   Drug use: No   Sexual activity: Not on file  Other Topics Concern   Not on file  Social History Narrative   Not on file   Social Determinants of Health   Financial Resource Strain: Not on file  Food Insecurity: Not on file  Transportation Needs: Not on file  Physical Activity: Not on file  Stress: Not on file  Social Connections: Not on file   Past Surgical History:  Procedure Laterality Date    CARDIAC SURGERY  2003   stent   IMPLANTATION VAGAL NERVE STIMULATOR     TRIGGER FINGER RELEASE     TUMOR REMOVAL  2002   S1 tumor removal by Troy [name]   VAGAL NERVE STIMULATOR REMOVAL     Past Surgical History:  Procedure Laterality Date   CARDIAC SURGERY  2003   stent   IMPLANTATION VAGAL NERVE STIMULATOR     TRIGGER FINGER RELEASE     TUMOR REMOVAL  2002   S1 tumor removal by Troy [name]   VAGAL NERVE STIMULATOR REMOVAL     Past Medical History:  Diagnosis Date   Arthritis    Diabetes mellitus    High blood pressure    Plantar fasciitis    Ht 5\' 7"  (1.702 m)   Wt 150 lb (68 kg)   BMI 23.49 kg/m   Opioid Risk Score:   Fall Risk Score:  `1  Depression screen [facility] 2/9     12/26/2022   12:52 PM 10/26/2022    9:59 AM 09/25/2022   10:22 AM 07/13/2022   10:33 AM 04/20/2022   10:46 AM 03/16/2022   10:31 AM 02/08/2022    9:49 AM  Depression screen PHQ 2/9  Decreased  Interest 0 0 0  0 2 0  Down, Depressed, Hopeless 0 0 0 0 0 0 0  PHQ - 2 Score 0 0 0 0 0 2 0  Altered sleeping    0     PHQ-9 Score    0         Review of Systems  Musculoskeletal:  Positive for gait problem.  All other systems reviewed and are negative.     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   We will continue the opioid monitoring program, this consists of regular clinic visits, examinations, urine drug screen, pill counts as well as use of [place] Controlled Substance Reporting system. A 12 month History has been reviewed on the [place] Controlled Substance Reporting System on 11/27/2022.  2. Diabetic peripheral neuropathy affecting both feet:  Continue current medication regimen with Gabapentin .11/27/2022 3. Ischial Pain : Left:No complaints today. : Continue HEP as Tolerated. Continue to  Monitor.11/27/2022 4. Essential Hypertension: Blood Pressure was re-checked. Troy [name] reports he is compliant with his anti-hypertensive medications. PCP Following   F/U in 1 month

## 2022-12-28 ENCOUNTER — Encounter: Payer: Self-pay | Admitting: Registered Nurse

## 2023-01-22 ENCOUNTER — Telehealth: Payer: Self-pay | Admitting: Registered Nurse

## 2023-01-22 NOTE — Telephone Encounter (Signed)
Wife called to let us know Troy [name] just got out of hospital and is unable to make his appointment this week.  He said he will be unable to come until January.  Patient would like a call back from [place] about medication and next appointment.

## 2023-01-23 ENCOUNTER — Telehealth: Payer: Self-pay | Admitting: Registered Nurse

## 2023-01-23 NOTE — Telephone Encounter (Signed)
Call placed to [name],  No answer. Left message to return the call

## 2023-01-25 ENCOUNTER — Encounter: Payer: Medicare Other | Admitting: Registered Nurse

## 2023-01-25 ENCOUNTER — Telehealth: Payer: Self-pay | Admitting: Registered Nurse

## 2023-01-25 MED ORDER — OXYCODONE HCL 15 MG PO TABS: 15.0000 mg | ORAL_TABLET | Freq: Three times a day (TID) | ORAL | 0 refills | Status: DC | PRN

## 2023-01-25 NOTE — Telephone Encounter (Signed)
Return Troy [name] call ,  Troy [name] reports Troy [name] was hospitalized at [facility] for Sepsis, he is receiving IV Antibiotics daily and unable to come to office for an appointment. His appointment was re-scheduled January 3 rd at 10:40 with arrival time at 10:20, she verbalizes understanding.  Troy [name] counted Troy [name] Oxycodone he has 14 tablets .  Oxycodone e-scribed to pharmacy, she verbalizes understanding.

## 2023-01-25 NOTE — Telephone Encounter (Signed)
[name] was returning a call in regards to Troy [name] and needing his medication

## 2023-01-29 ENCOUNTER — Other Ambulatory Visit: Payer: Self-pay | Admitting: Registered Nurse

## 2023-01-31 NOTE — Telephone Encounter (Signed)
PMP was Reviewed.  Gabapentin e-scribed to pharmacy.

## 2023-02-23 ENCOUNTER — Encounter: Payer: Self-pay | Admitting: Registered Nurse

## 2023-02-23 ENCOUNTER — Encounter: Payer: Medicare Other | Attending: Physical Medicine & Rehabilitation | Admitting: Registered Nurse

## 2023-02-23 VITALS — BP 147/57 | HR 63 | Ht 67.0 in | Wt 154.6 lb

## 2023-02-23 DIAGNOSIS — I10 Essential (primary) hypertension: Secondary | ICD-10-CM | POA: Insufficient documentation

## 2023-02-23 DIAGNOSIS — G894 Chronic pain syndrome: Secondary | ICD-10-CM | POA: Insufficient documentation

## 2023-02-23 DIAGNOSIS — Z79891 Long term (current) use of opiate analgesic: Secondary | ICD-10-CM | POA: Diagnosis present

## 2023-02-23 DIAGNOSIS — E1149 Type 2 diabetes mellitus with other diabetic neurological complication: Secondary | ICD-10-CM | POA: Diagnosis present

## 2023-02-23 DIAGNOSIS — E1140 Type 2 diabetes mellitus with diabetic neuropathy, unspecified: Secondary | ICD-10-CM | POA: Diagnosis present

## 2023-02-23 DIAGNOSIS — Z86018 Personal history of other benign neoplasm: Secondary | ICD-10-CM | POA: Insufficient documentation

## 2023-02-23 DIAGNOSIS — Z5181 Encounter for therapeutic drug level monitoring: Secondary | ICD-10-CM | POA: Insufficient documentation

## 2023-02-23 DIAGNOSIS — Z794 Long term (current) use of insulin: Secondary | ICD-10-CM

## 2023-02-23 MED ORDER — OXYCODONE HCL 15 MG PO TABS: 15.0000 mg | ORAL_TABLET | Freq: Three times a day (TID) | ORAL | 0 refills | Status: AC | PRN

## 2023-02-23 NOTE — Progress Notes (Signed)
 Subjective:    Patient ID: Troy [name], male    DOB: 1945/11/22, 78 y.o.   MRN: [id number]  HPI: Troy [name] is a 78 y.o. male who returns for follow up appointment for chronic pain and medication refill. He states his pain is located in his right foot.He rates his pain 10. His current exercise regime is walking and performing stretching exercises.  [name] Morphine equivalent is 67.50 MME.   Oral Swab was Performed today.   [name] reports he was hospitalized in November at [facility].     Pain Inventory Average Pain 8 Pain Right Now 10 My pain is intermittent, sharp, and aching  In the last 24 hours, has pain interfered with the following? General activity 8 Relation with others 7 Enjoyment of life 8 What TIME of day is your pain at its worst? daytime and evening Sleep (in general) Good  Pain is worse with: walking and some activites Pain improves with: rest and medication Relief from Meds: 8  Family History  Problem Relation Age of Onset   Hypertension Mother    Diabetes Mother    Stroke Mother        multiple   Hypertension Father    Stroke Father    Stroke Sister    Stroke Daughter    Social History   Socioeconomic History   Marital status: Married    Spouse name: Not on file   Number of children: Not on file   Years of education: Not on file   Highest education level: Not on file  Occupational History   Not on file  Tobacco Use   Smoking status: Never   Smokeless tobacco: Never  Vaping Use   Vaping status: Never Used  Substance and Sexual Activity   Alcohol  use: No    Alcohol /week: 0.0 standard drinks of alcohol    Drug use: No   Sexual activity: Not on file  Other Topics Concern   Not on file  Social History Narrative   Not on file   Social Drivers of Health   Financial Resource Strain: Not on file  Food Insecurity: Not on file  Transportation Needs: Not on file  Physical Activity: Not on file  Stress: Not on file  Social  Connections: Not on file   Past Surgical History:  Procedure Laterality Date   CARDIAC SURGERY  2003   stent   IMPLANTATION VAGAL NERVE STIMULATOR     TRIGGER FINGER RELEASE     TUMOR REMOVAL  2002   S1 tumor removal by Dr. Louis   VAGAL NERVE STIMULATOR REMOVAL     Past Surgical History:  Procedure Laterality Date   CARDIAC SURGERY  2003   stent   IMPLANTATION VAGAL NERVE STIMULATOR     TRIGGER FINGER RELEASE     TUMOR REMOVAL  2002   S1 tumor removal by Dr. Louis   VAGAL NERVE STIMULATOR REMOVAL     Past Medical History:  Diagnosis Date   Arthritis    Diabetes mellitus    High blood pressure    Plantar fasciitis    BP (!) 170/55   Pulse 63   Ht 5' 7 (1.702 m)   Wt 154 lb 9.6 oz (70.1 kg)   SpO2 97%   BMI 24.21 kg/m   Opioid Risk Score:   Fall Risk Score:  `1  Depression screen [facility] 2/9     02/23/2023   11:03 AM 12/26/2022   12:52 PM 10/26/2022  9:59 AM 09/25/2022   10:22 AM 07/13/2022   10:33 AM 04/20/2022   10:46 AM 03/16/2022   10:31 AM  Depression screen PHQ 2/9  Decreased Interest 0 0 0 0  0 2  Down, Depressed, Hopeless 0 0 0 0 0 0 0  PHQ - 2 Score 0 0 0 0 0 0 2  Altered sleeping     0    PHQ-9 Score     0       Review of Systems  Musculoskeletal:  Positive for gait problem.  Skin:  Positive for wound.       Right foot  All other systems reviewed and are negative.      Objective:   Physical Exam Vitals and nursing note reviewed.  Constitutional:      Appearance: Normal appearance.  Cardiovascular:     Rate and Rhythm: Normal rate and regular rhythm.     Pulses: Normal pulses.     Heart sounds: Normal heart sounds.  Pulmonary:     Effort: Pulmonary effort is normal.     Breath sounds: Normal breath sounds.  Musculoskeletal:     Cervical back: Normal range of motion and neck supple.     Comments: Normal Muscle Bulk and Muscle Testing Reveals:  Upper Extremities: Full ROM and Muscle Strength 5/5  Lower Extremities: Full ROM and Muscle  Strength 5/5 Arises from Chair slowly Narrow Based  Gait     Skin:    General: Skin is warm and dry.  Neurological:     Mental Status: He is alert and oriented to person, place, and time.  Psychiatric:        Mood and Affect: Mood normal.        Behavior: Behavior normal.         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   We will continue the opioid monitoring program, this consists of regular clinic visits, examinations, urine drug screen, pill counts as well as use of [place]  Controlled Substance Reporting system. A 12 month History has been reviewed on the [place]  Controlled Substance Reporting System on 02/23/2023.  2. Diabetic peripheral neuropathy affecting both feet:  Continue current medication regimen with Gabapentin  .02/23/2023 3. Ischial Pain : Left:No complaints today. : Continue HEP as Tolerated. Continue to Monitor.02/23/2023 4. Right Foot Ulcer: Podiatry Following Mr. Werber reports . 02/23/2023   F/U in 1 month

## 2023-02-28 LAB — DRUG TOX MONITOR 1 W/CONF, ORAL FLD
Amphetamines: NEGATIVE ng/mL (ref <10)
Barbiturates: NEGATIVE ng/mL (ref <10)
Benzodiazepines: NEGATIVE ng/mL (ref <0.50)
Buprenorphine: NEGATIVE ng/mL (ref <0.10)
Cocaine: NEGATIVE ng/mL (ref <5.0)
Codeine: NEGATIVE ng/mL (ref <2.5)
Dihydrocodeine: NEGATIVE ng/mL (ref <2.5)
Fentanyl: NEGATIVE ng/mL (ref <0.10)
Heroin Metabolite: NEGATIVE ng/mL (ref <1.0)
Hydrocodone: NEGATIVE ng/mL (ref <2.5)
Hydromorphone: NEGATIVE ng/mL (ref <2.5)
MARIJUANA: NEGATIVE ng/mL (ref <2.5)
MDMA: NEGATIVE ng/mL (ref <10)
Meprobamate: NEGATIVE ng/mL (ref <2.5)
Methadone: NEGATIVE ng/mL (ref <5.0)
Morphine: NEGATIVE ng/mL (ref <2.5)
Nicotine Metabolite: NEGATIVE ng/mL (ref <5.0)
Norhydrocodone: NEGATIVE ng/mL (ref <2.5)
Noroxycodone: 37.1 ng/mL — ABNORMAL HIGH (ref <2.5)
Opiates: POSITIVE ng/mL — AB (ref <2.5)
Oxycodone: >250 ng/mL — ABNORMAL HIGH (ref <2.5)
Oxymorphone: NEGATIVE ng/mL (ref <2.5)
Phencyclidine: NEGATIVE ng/mL (ref <10)
Tapentadol: NEGATIVE ng/mL (ref <5.0)
Tramadol: NEGATIVE ng/mL (ref <5.0)
Zolpidem: NEGATIVE ng/mL (ref <5.0)

## 2023-02-28 LAB — DRUG TOX ALC METAB W/CON, ORAL FLD: Alcohol Metabolite: NEGATIVE ng/mL (ref <25)

## 2023-03-28 ENCOUNTER — Encounter: Payer: Medicare Other | Attending: Physical Medicine & Rehabilitation | Admitting: Registered Nurse

## 2023-03-28 DIAGNOSIS — Z5181 Encounter for therapeutic drug level monitoring: Secondary | ICD-10-CM | POA: Insufficient documentation

## 2023-03-28 DIAGNOSIS — I10 Essential (primary) hypertension: Secondary | ICD-10-CM | POA: Insufficient documentation

## 2023-03-28 DIAGNOSIS — Z86018 Personal history of other benign neoplasm: Secondary | ICD-10-CM | POA: Insufficient documentation

## 2023-03-28 DIAGNOSIS — E1140 Type 2 diabetes mellitus with diabetic neuropathy, unspecified: Secondary | ICD-10-CM | POA: Insufficient documentation

## 2023-03-28 DIAGNOSIS — Z79891 Long term (current) use of opiate analgesic: Secondary | ICD-10-CM | POA: Insufficient documentation

## 2023-03-28 DIAGNOSIS — G894 Chronic pain syndrome: Secondary | ICD-10-CM | POA: Insufficient documentation

## 2023-03-28 DIAGNOSIS — E1149 Type 2 diabetes mellitus with other diabetic neurological complication: Secondary | ICD-10-CM | POA: Insufficient documentation

## 2023-03-28 NOTE — Progress Notes (Deleted)
 Subjective:    Patient ID: Troy [name], male    DOB: 1945-08-03, 78 y.o.   MRN: [id number]  HPI Pain Inventory Average Pain {NUMBERS; 0-10:5044} Pain Right Now {NUMBERS; 0-10:5044} My pain is {PAIN DESCRIPTION:21022940}  In the last 24 hours, has pain interfered with the following? General activity {NUMBERS; 0-10:5044} Relation with others {NUMBERS; 0-10:5044} Enjoyment of life {NUMBERS; 0-10:5044} What TIME of day is your pain at its worst? {time of day:24191} Sleep (in general) {BHH GOOD/FAIR/POOR:22877}  Pain is worse with: {ACTIVITIES:21022942} Pain improves with: {PAIN IMPROVES TPUY:78977056} Relief from Meds: {NUMBERS; 0-10:5044}  Family History  Problem Relation Age of Onset   Hypertension Mother    Diabetes Mother    Stroke Mother        multiple   Hypertension Father    Stroke Father    Stroke Sister    Stroke Daughter    Social History   Socioeconomic History   Marital status: Married    Spouse name: Not on file   Number of children: Not on file   Years of education: Not on file   Highest education level: Not on file  Occupational History   Not on file  Tobacco Use   Smoking status: Never   Smokeless tobacco: Never  Vaping Use   Vaping status: Never Used  Substance and Sexual Activity   Alcohol  use: No    Alcohol /week: 0.0 standard drinks of alcohol    Drug use: No   Sexual activity: Not on file  Other Topics Concern   Not on file  Social History Narrative   Not on file   Social Drivers of Health   Financial Resource Strain: Not on file  Food Insecurity: Not on file  Transportation Needs: Not on file  Physical Activity: Not on file  Stress: Not on file  Social Connections: Not on file   Past Surgical History:  Procedure Laterality Date   CARDIAC SURGERY  2003   stent   IMPLANTATION VAGAL NERVE STIMULATOR     TRIGGER FINGER RELEASE     TUMOR REMOVAL  2002   S1 tumor removal by Dr. Louis   VAGAL NERVE STIMULATOR REMOVAL     Past  Surgical History:  Procedure Laterality Date   CARDIAC SURGERY  2003   stent   IMPLANTATION VAGAL NERVE STIMULATOR     TRIGGER FINGER RELEASE     TUMOR REMOVAL  2002   S1 tumor removal by Dr. Louis   VAGAL NERVE STIMULATOR REMOVAL     Past Medical History:  Diagnosis Date   Arthritis    Diabetes mellitus    High blood pressure    Plantar fasciitis    There were no vitals taken for this visit.  Opioid Risk Score:   Fall Risk Score:  `1  Depression screen [facility] 2/9     02/23/2023   11:03 AM 12/26/2022   12:52 PM 10/26/2022    9:59 AM 09/25/2022   10:22 AM 07/13/2022   10:33 AM 04/20/2022   10:46 AM 03/16/2022   10:31 AM  Depression screen PHQ 2/9  Decreased Interest 0 0 0 0  0 2  Down, Depressed, Hopeless 0 0 0 0 0 0 0  PHQ - 2 Score 0 0 0 0 0 0 2  Altered sleeping     0    PHQ-9 Score     0        Review of Systems     Objective:   Physical Exam  Assessment & Plan:

## 2023-04-21 DEATH — deceased
